# Patient Record
Sex: Female | Born: 1940 | Race: White | Hispanic: No
Health system: Midwestern US, Community
[De-identification: ages and names within clinical notes are randomized; demographics above are authoritative.]

## PROBLEM LIST (undated history)

## (undated) DIAGNOSIS — Z86010 Personal history of colonic polyps: Secondary | ICD-10-CM

## (undated) DIAGNOSIS — R0600 Dyspnea, unspecified: Secondary | ICD-10-CM

## (undated) DIAGNOSIS — Z95810 Presence of automatic (implantable) cardiac defibrillator: Secondary | ICD-10-CM

## (undated) DIAGNOSIS — I509 Heart failure, unspecified: Secondary | ICD-10-CM

## (undated) DIAGNOSIS — I119 Hypertensive heart disease without heart failure: Secondary | ICD-10-CM

## (undated) DIAGNOSIS — I2109 ST elevation (STEMI) myocardial infarction involving other coronary artery of anterior wall: Secondary | ICD-10-CM

## (undated) DIAGNOSIS — C3490 Malignant neoplasm of unspecified part of unspecified bronchus or lung: Secondary | ICD-10-CM

## (undated) DIAGNOSIS — E669 Obesity, unspecified: Secondary | ICD-10-CM

## (undated) DIAGNOSIS — F419 Anxiety disorder, unspecified: Secondary | ICD-10-CM

## (undated) DIAGNOSIS — E1140 Type 2 diabetes mellitus with diabetic neuropathy, unspecified: Secondary | ICD-10-CM

## (undated) DIAGNOSIS — I10 Essential (primary) hypertension: Secondary | ICD-10-CM

## (undated) DIAGNOSIS — E66812 Obesity, class 2: Secondary | ICD-10-CM

## (undated) HISTORY — PX: FRACTURE SURGERY: SHX138

## (undated) HISTORY — DX: Presence of automatic (implantable) cardiac defibrillator: Z95.810

## (undated) HISTORY — DX: Hypertensive heart disease without heart failure: I11.9

## (undated) HISTORY — PX: JOINT REPLACEMENT: SHX530

## (undated) HISTORY — DX: ST elevation (STEMI) myocardial infarction involving other coronary artery of anterior wall: I21.09

## (undated) HISTORY — PX: CARPAL TUNNEL RELEASE: SHX101

## (undated) HISTORY — DX: Obesity, class 2: E66.812

## (undated) HISTORY — DX: Obesity, unspecified: E66.9

## (undated) HISTORY — DX: Type 2 diabetes mellitus with diabetic neuropathy, unspecified: E11.40

## (undated) HISTORY — DX: Malignant neoplasm of unspecified part of unspecified bronchus or lung: C34.90

## (undated) HISTORY — PX: EYE SURGERY: SHX253

## (undated) HISTORY — PX: OTHER SURGICAL HISTORY: SHX169

---

## 2015-03-16 ENCOUNTER — Inpatient Hospital Stay: Payer: MEDICARE

## 2015-03-16 LAB — GLUCOSE, POC: Glucose, bedside: 133 MG/DL — ABNORMAL HIGH (ref 70–110)

## 2015-03-16 MED ORDER — LACTATED RINGERS IV
INTRAVENOUS | Status: DC
Start: 2015-03-16 — End: 2015-03-16
  Administered 2015-03-16: 15:00:00 via INTRAVENOUS

## 2015-03-16 MED ORDER — PROPOFOL 10 MG/ML IV EMUL
10 mg/mL | INTRAVENOUS | Status: AC
Start: 2015-03-16 — End: ?

## 2015-03-16 MED ORDER — LIDOCAINE (PF) 20 MG/ML (2 %) IJ SOLN
20 mg/mL (2 %) | INTRAMUSCULAR | Status: AC
Start: 2015-03-16 — End: ?

## 2015-03-16 MED ORDER — LIDOCAINE (PF) 20 MG/ML (2 %) IJ SOLN
20 mg/mL (2 %) | INTRAMUSCULAR | Status: DC | PRN
Start: 2015-03-16 — End: 2015-03-16
  Administered 2015-03-16: 16:00:00 via INTRAVENOUS

## 2015-03-16 MED ORDER — PROPOFOL 10 MG/ML IV EMUL
10 mg/mL | INTRAVENOUS | Status: DC | PRN
Start: 2015-03-16 — End: 2015-03-16
  Administered 2015-03-16 (×3): via INTRAVENOUS

## 2015-03-16 MED FILL — PROPOFOL 10 MG/ML IV EMUL: 10 mg/mL | INTRAVENOUS | Qty: 40

## 2015-03-16 MED FILL — LIDOCAINE (PF) 20 MG/ML (2 %) IJ SOLN: 20 mg/mL (2 %) | INTRAMUSCULAR | Qty: 5

## 2015-03-16 MED FILL — LACTATED RINGERS IV: INTRAVENOUS | Qty: 1000

## 2015-03-16 MED FILL — DIPRIVAN 10 MG/ML INTRAVENOUS EMULSION: 10 mg/mL | INTRAVENOUS | Qty: 200

## 2015-03-16 MED FILL — LIDOCAINE (PF) 20 MG/ML (2 %) IJ SOLN: 20 mg/mL (2 %) | INTRAMUSCULAR | Qty: 100

## 2015-03-16 NOTE — Anesthesia Post-Procedure Evaluation (Signed)
Post-Anesthesia Evaluation and Assessment    Patient: Anna MalletCatherine Castiglia MRN: [id number]  SSN: [id number]    Date of Birth: 12/09/1940  Age: 74 y.o.  Sex: female       Cardiovascular Function/Vital Signs  Visit Vitals   ??? BP 118/76   ??? Pulse 71   ??? Temp 36.7 ??C (98 ??F)   ??? Resp 15   ??? Ht 5' (1.524 m)   ??? Wt 79.4 kg (175 lb)   ??? SpO2 97%   ??? Breastfeeding No   ??? BMI 34.18 kg/m2       Patient is status post general, total IV anesthesia anesthesia for Procedure(s):  COLONOSCOPY with polypectomies.    Nausea/Vomiting: None    Postoperative hydration reviewed and adequate.    Pain:  Pain Scale 1: Numeric (0 - 10) (03/16/15 1201)  Pain Intensity 1: 0 (03/16/15 1201)   Managed    Neurological Status:       At baseline    Mental Status and Level of Consciousness: Arousable    Pulmonary Status:   O2 Device: Room air (03/16/15 1201)   Adequate oxygenation and airway patent    Complications related to anesthesia: None    Post-anesthesia assessment completed. No concerns    Signed By: Melina SchoolsElizabeth Shenna Brissette, MD     March 16, 2015

## 2015-03-16 NOTE — H&P (Signed)
[facility] H and P      Subjective:     Anna Pacheco is a 74 y.o. presenting for a colonoscopy         Past Medical History   Diagnosis Date   ??? CAD (coronary artery disease)    ??? Cancer [facility](HCC)      Thyroid cancer   ??? Diabetes (HCC)    ??? Heart failure (HCC) 2008     Silent MI and one stent   ??? Hypertension    ??? Psychiatric disorder      Aniexty   ??? Thyroid disease       Past Surgical History   Procedure Laterality Date   ??? Hx thyroidectomy     ??? Pr cardiac surg procedure unlist       Cardiac stent x1   ??? Hx gyn       D and C   ??? Hx pacemaker  2008     Pacemaker/Defibrilator    ??? Hx orthopaedic Left 2010     Total Knee Replacement   ??? Hx other surgical Bilateral      Laser surg for narrow angle glaucoma     History reviewed. No pertinent family history.   Social History   Substance Use Topics   ??? Smoking status: Not on file   ??? Smokeless tobacco: Not on file   ??? Alcohol use Not on file       Current Facility-Administered Medications   Medication Dose Route Frequency Provider Last Rate Last Dose   ??? lactated ringers infusion  75 mL/hr IntraVENous CONTINUOUS Tonette Ledereravid J. Massie Cogliano, MD 75 mL/hr at 03/16/15 1020 75 mL/hr at 03/16/15 1020      Current Discharge Medication List      CONTINUE these medications which have NOT CHANGED    Details   COQ10, LIPOSOMAL UBIQUINOL, PO Take 1 Tab by mouth daily.      SITagliptin (JANUVIA) 100 mg tablet Take 100 mg by mouth daily.      carvedilol (COREG) 6.25 mg tablet Take 6.25 mg by mouth two (2) times daily (with meals).      glimepiride (AMARYL) 1 mg tablet Take 1 mg by mouth Daily (before breakfast). Patient only took a 1/2 this am      metFORMIN (GLUCOPHAGE) 500 mg tablet Take 500 mg by mouth daily (with breakfast).      furosemide (LASIX) 40 mg tablet Take 40 mg by mouth daily.      lisinopril (PRINIVIL, ZESTRIL) 10 mg tablet Take 10 mg by mouth two (2) times a day.      atorvastatin (LIPITOR) 40 mg tablet Take 40 mg by mouth daily.       fenofibric acid (TRILIPIX) 135 mg capsule Take 135 mg by mouth daily.      levothyroxine (SYNTHROID) 112 mcg tablet Take 112 mcg by mouth Daily (before breakfast).      clonazePAM (KLONOPIN) 1 mg tablet Take 1 mg by mouth as needed.      aspirin delayed-release 81 mg tablet Take 81 mg by mouth daily.             Allergies   Allergen Reactions   ??? Latex Hives   ??? Nickel Swelling   ??? Other Plant, Animal, Environmental Hives     Patient gets arash to p[lastic          Objective:     Visit Vitals   ??? BP 137/51   ??? Pulse 72   ???  Temp 98.3 ??F (36.8 ??C)   ??? Resp 16   ??? Ht 5' (1.524 m)   ??? Wt 79.4 kg (175 lb)   ??? SpO2 96%   ??? Breastfeeding No   ??? BMI 34.18 kg/m2     Physical Exam:    Gen: Awake, NAD, alert and oriented x 3  HEENT:  no pharyngeal lesions, no scleral icterus  Neck: supple  Lungs: good bilateral BS, no respiratory distress  Cards: RRR s1s2, no murmurs, rubs or gallops  Abdomen: Positive bowel sounds, soft, NT, ND, no rebound or guarding, no HSM  Extremities: no clubbing, cyanosis or edema  Neuro: no asterixis, moves all extremities  Psych: full affect      Data Review:   Recent Results (from the past 24 hour(s))   GLUCOSE, POC    Collection Time: 03/16/15  9:50 AM   Result Value Ref Range    Glucose, bedside 133 (H) 70 - 110 MG/DL       No results found.    Assessment:   Hx of polyps, screen      Plan:   Colonoscopy  For the above procedure.  Risks reviewed in detail.  Risks including but not limited to bleeding, perforation, need for emergent surgery, drug reaction, infection, or missed lesions were all reviewed and informed consent obtained. All questions answered.    Signed By: Tonette Lederer, MD     March 16, 2015

## 2015-03-16 NOTE — Op Note (Signed)
[facility]  COLONOSCOPY    Name:  Anna Pacheco, Anna Pacheco  MR#:  [id number]  DOB:  07/29/1940  Account #:  [id number]  Date of Adm:  03/16/2015      DATE OF PROCEDURE: 03/16/2015    ENDOSCOPIST: Ishana Blades, MD.    PREOPERATIVE DIAGNOSIS: Screening and history of polyps.    POSTOPERATIVE DIAGNOSES  1. Diverticulosis.  2. Colonic polyps.    PROCEDURE PERFORMED: Colonoscopy with ileoscopy, biopsy.    ANESTHESIA: MAC.    ANESTHESIOLOGIST: Elizabeth Kao, MD    DESCRIPTION OF PROCEDURE: Findings: Digital rectal examination  performed, no masses. Terminal ileum and cecum normal. Ascending colon  had a small polyp removed with biopsy forceps. There was  diverticulosis in the ascending, transverse, descending and sigmoid,  it was dense in the descending and sigmoid. In the rectum, there was  also a small polyp that was removed with biopsy forceps, about 3 mm.  Retroflexion, no additional lesions seen. Tolerated well.    PLAN: Biopsy will be checked as outpatient. To be maintained on  fiber.        Sherolyn Trettin MD   :[id number]  D:  03/16/2015   11:37  T:  03/16/2015   18:38  Job #:  [id number]

## 2015-03-16 NOTE — Procedures (Signed)
Colonoscopy Operative Report    Anna Pacheco  [id number]  05/16/1940      Procedure Type:   Colonoscopy    Indications:  Hx of polyps and screen     Operator:  Gevorg Brum J. Faatima Tench, MD      Referring Provider: Andrew D. Hirsch, DO      Sedation:  MAC anesthesia      Procedure Details:  After informed consent was obtained with all risks and benefits of procedure explained and preoperative exam completed, the patient was taken to the endoscopy suite and placed in the left lateral decubitus position.  Upon sedation as per above, a digital rectal exam was performed.  The Olympus videocolonoscope  was inserted in the rectum and carefully advanced to the terminal ileum.  The cecum was identified by the ileocecal valve and appendiceal orifice.  The quality of preparation was excellent.  The colonoscope was slowly withdrawn with careful evaluation between folds. Retroflexion in the rectum was completed .     Findings:   Rectum: 3mm polyp bx  Sigmoid: moderate diverticulosis;  Descending Colon: moderate diverticulosis;  Transverse Colon: mild diverticulosis;  Ascending Colon: mild diverticulosis;3mm polyp bx  Cecum: normal  Terminal Ileum: normal      Specimen Removed:  polyps    Complications: None.     EBL:  None.    Impression:    Polyps and diverticulosis    Recommendations: --Await pathology., -High fiber diet.      Signed By: Karmah Potocki J. Chealsey Miyamoto, MD     03/16/2015  11:40 AM

## 2015-03-16 NOTE — Anesthesia Pre-Procedure Evaluation (Addendum)
Anesthetic History   No history of anesthetic complications            Review of Systems / Medical History  Patient summary reviewed, nursing notes reviewed and pertinent labs reviewed    Pulmonary  Within defined limits                 Neuro/Psych         Psychiatric history     Cardiovascular    Hypertension          Pacemaker, CAD and cardiac stents    Exercise tolerance: >4 METS     GI/Hepatic/Renal  Within defined limits              Endo/Other    Diabetes  Hypothyroidism  Cancer     Other Findings            Physical Exam    Airway  Mallampati: II  TM Distance: 4 - 6 cm  Neck ROM: normal range of motion   Mouth opening: Normal     Cardiovascular  Regular rate and rhythm,  S1 and S2 normal,  no murmur, click, rub, or gallop  Rhythm: regular  Rate: normal         Dental    Dentition: Full upper dentures     Pulmonary  Breath sounds clear to auscultation               Abdominal  GI exam deferred       Other Findings            Anesthetic Plan    ASA: 3  Anesthesia type: general and total IV anesthesia          Induction: Intravenous  Anesthetic plan and risks discussed with: Patient

## 2015-03-16 NOTE — Procedures (Signed)
Colonoscopy Operative Report    Anna MalletCatherine Pacheco  [id number]  09/06/1940      Procedure Type:   Colonoscopy    Indications:  Hx of polyps and screen     Operator:  Tonette Ledereravid J. Cayman Kielbasa, MD      Referring Provider: Arlington CalixAndrew D. Phoebe PerchHirsch, DO      Sedation:  MAC anesthesia      Procedure Details:  After informed consent was obtained with all risks and benefits of procedure explained and preoperative exam completed, the patient was taken to the endoscopy suite and placed in the left lateral decubitus position.  Upon sedation as per above, a digital rectal exam was performed.  The Olympus videocolonoscope  was inserted in the rectum and carefully advanced to the terminal ileum.  The cecum was identified by the ileocecal valve and appendiceal orifice.  The quality of preparation was excellent.  The colonoscope was slowly withdrawn with careful evaluation between folds. Retroflexion in the rectum was completed .     Findings:   Rectum: 3mm polyp bx  Sigmoid: moderate diverticulosis;  Descending Colon: moderate diverticulosis;  Transverse Colon: mild diverticulosis;  Ascending Colon: mild diverticulosis;563mm polyp bx  Cecum: normal  Terminal Ileum: normal      Specimen Removed:  polyps    Complications: None.     EBL:  None.    Impression:    Polyps and diverticulosis    Recommendations: --Await pathology., -High fiber diet.      Signed By: Tonette Ledereravid J. Theona Muhs, MD     03/16/2015  11:40 AM

## 2015-03-16 NOTE — Op Note (Signed)
[facility]    Name:  Anna MalletCOYLE, Carlia  MR#:  [id number]  DOB:  1941-04-04  Account #:  [id number]  Date of Adm:  03/16/2015      DATE OF PROCEDURE: 03/16/2015    ENDOSCOPIST: Doreen Beamavid Zelena Bushong, MD.    PREOPERATIVE DIAGNOSIS: Screening and history of polyps.    POSTOPERATIVE DIAGNOSES  1. Diverticulosis.  2. Colonic polyps.    PROCEDURE PERFORMED: Colonoscopy with ileoscopy, biopsy.    ANESTHESIA: MAC.    ANESTHESIOLOGIST: Melina SchoolsElizabeth Kao, MD    DESCRIPTION OF PROCEDURE: Findings: Digital rectal examination  performed, no masses. Terminal ileum and cecum normal. Ascending colon  had a small polyp removed with biopsy forceps. There was  diverticulosis in the ascending, transverse, descending and sigmoid,  it was dense in the descending and sigmoid. In the rectum, there was  also a small polyp that was removed with biopsy forceps, about 3 mm.  Retroflexion, no additional lesions seen. Tolerated well.    PLAN: Biopsy will be checked as outpatient. To be maintained on  fiber.        Rennis HardingELLIS, Maryfrances Portugal MD   :[id number]  D:  03/16/2015   11:37  T:  03/16/2015   18:38  Job #:  [id number]

## 2016-08-07 NOTE — Telephone Encounter (Signed)
Patient is a new patient will be seen 08/13/16 anxious about recent reports. Would like a call back.

## 2016-08-13 ENCOUNTER — Encounter: Attending: Surgery | Primary: Family Medicine

## 2017-05-03 ENCOUNTER — Ambulatory Visit (INDEPENDENT_AMBULATORY_CARE_PROVIDER_SITE_OTHER): Payer: Medicare Other | Admitting: Physician Assistant

## 2017-05-03 ENCOUNTER — Encounter: Payer: Self-pay | Admitting: Physician Assistant

## 2017-05-03 VITALS — BP 144/76 | HR 72 | Temp 98.8°F | Resp 16 | Ht 59.5 in | Wt 176.6 lb

## 2017-05-03 DIAGNOSIS — Z9889 Other specified postprocedural states: Secondary | ICD-10-CM | POA: Diagnosis not present

## 2017-05-03 DIAGNOSIS — H269 Unspecified cataract: Secondary | ICD-10-CM | POA: Diagnosis not present

## 2017-05-03 DIAGNOSIS — Z96659 Presence of unspecified artificial knee joint: Secondary | ICD-10-CM

## 2017-05-03 DIAGNOSIS — I509 Heart failure, unspecified: Secondary | ICD-10-CM

## 2017-05-03 DIAGNOSIS — E1140 Type 2 diabetes mellitus with diabetic neuropathy, unspecified: Secondary | ICD-10-CM | POA: Insufficient documentation

## 2017-05-03 DIAGNOSIS — I252 Old myocardial infarction: Secondary | ICD-10-CM | POA: Insufficient documentation

## 2017-05-03 DIAGNOSIS — F419 Anxiety disorder, unspecified: Secondary | ICD-10-CM | POA: Diagnosis not present

## 2017-05-03 DIAGNOSIS — E890 Postprocedural hypothyroidism: Secondary | ICD-10-CM | POA: Insufficient documentation

## 2017-05-03 DIAGNOSIS — M1990 Unspecified osteoarthritis, unspecified site: Secondary | ICD-10-CM | POA: Insufficient documentation

## 2017-05-03 DIAGNOSIS — I10 Essential (primary) hypertension: Secondary | ICD-10-CM | POA: Diagnosis not present

## 2017-05-03 DIAGNOSIS — J309 Allergic rhinitis, unspecified: Secondary | ICD-10-CM | POA: Insufficient documentation

## 2017-05-03 DIAGNOSIS — Z7689 Persons encountering health services in other specified circumstances: Secondary | ICD-10-CM

## 2017-05-03 DIAGNOSIS — E119 Type 2 diabetes mellitus without complications: Secondary | ICD-10-CM

## 2017-05-03 DIAGNOSIS — Z9009 Acquired absence of other part of head and neck: Secondary | ICD-10-CM | POA: Insufficient documentation

## 2017-05-03 MED ORDER — METOPROLOL TARTRATE 25 MG PO TABS: 25.0000 mg | ORAL_TABLET | Freq: Two times a day (BID) | ORAL | 3 refills | Status: DC

## 2017-05-03 NOTE — Progress Notes (Signed)
Patient: Megan Shelton Female    DOB: 1940-12-02   77 y.o.   MRN: [id number] Visit Date: 05/03/2017  Today's Provider: Mar Daring, PA-C   Chief Complaint  Patient presents with  . New Patient (Initial Visit)   Subjective:    HPI Patient comes in today to establish with the practice. She reports that just moved from [place] and has been in the area for a few weeks, moved 04/15/17. She is moving here with her son. He is still in [place] at this time. He is retiring in March and will be moving with his wife down here. She feels well today with no other complaints.   She does have a h/o of silent MI. In 2008 she developed severe SOB and was found to be in heart failure. During the echocardiogram they found an area of her heart that was ischemic. They did place a cardiac stent as well. Following surgery she went into ventricular fibrillation. She underwent surgery day after having stent placed to have a defibrillator placed. She reports only having been shocked by it once in the 11 years she had it, and it was by accident. Company called and told her she did not need to go to hospital, felt it was because she got her heart rate up exercising at that time. She did have the defibrillator changed last year. She is currently taking Carvedilol 6.25mg  BID, lisinopril 10mg  and atorvastatin 40mg . She does have furosemide 20mg  on hand that she will take prn but takes so infrequently she has not needed in a while. Most recent labs were done on 03/05/17 and are all normal. These will be scanned in chart.   She also has small cataracts bilaterally that are not yet bad enough for repair. She is going to establish with an ophthalmologist.  She is also diabetic. She is well controlled with most recent A1c of 6.9 from 03/05/17. She is controlled with diet, exercise, metformin xr 500mg  daily, glimepiride 1mg , and Januvia 100mg .   She has had a thyroidectomy. She is stable on Levothyroxine 123mcg. TSH on  03/05/17 was 0.624.     Allergies  Allergen Reactions  . Latex   . Nickel      Current Outpatient Medications:  .  aspirin 81 MG tablet, Take 81 mg by mouth daily., Disp: , Rfl:  .  atorvastatin (LIPITOR) 40 MG tablet, Take 40 mg by mouth daily., Disp: , Rfl:  .  carvedilol (COREG) 6.25 MG tablet, Take 6.25 mg by mouth 2 (two) times daily with a meal., Disp: , Rfl:  .  clonazePAM (KLONOPIN) 1 MG tablet, Take 1 mg by mouth 3 (three) times daily as needed for anxiety., Disp: , Rfl:  .  Coenzyme Q10 (COQ10 PO), Take 1 capsule by mouth daily., Disp: , Rfl:  .  glimepiride (AMARYL) 1 MG tablet, Take 1 mg by mouth 2 (two) times daily., Disp: , Rfl:  .  levothyroxine (SYNTHROID, LEVOTHROID) 112 MCG tablet, Take 112 mcg by mouth daily before breakfast., Disp: , Rfl:  .  lisinopril (PRINIVIL,ZESTRIL) 10 MG tablet, Take 10 mg by mouth daily., Disp: , Rfl:  .  metFORMIN (GLUCOPHAGE-XR) 500 MG 24 hr tablet, Take 500 mg by mouth. Take 1 tablet by mouth in the mornings, and 2 tablets by mouth in the evenings., Disp: , Rfl:  .  sitaGLIPtin (JANUVIA) 100 MG tablet, Take 100 mg by mouth daily., Disp: , Rfl:  .  vitamin B-12 (CYANOCOBALAMIN) 100  MCG tablet, Take 100 mcg by mouth daily., Disp: , Rfl:   Review of Systems  Constitutional: Negative.   HENT: Negative.   Eyes: Negative.   Respiratory: Negative.   Cardiovascular: Negative.   Gastrointestinal: Negative.   Endocrine: Negative.   Genitourinary: Negative.   Musculoskeletal: Positive for arthralgias and back pain.  Skin: Negative.   Allergic/Immunologic: Negative.   Neurological: Negative.   Hematological: Negative.   Psychiatric/Behavioral: Negative.     Social History   Tobacco Use  . Smoking status: Former Research scientist (life sciences)  . Smokeless tobacco: Never Used  Substance Use Topics  . Alcohol use: No    Frequency: Never   Objective:   BP (!) 144/76 (BP Location: Left Arm, Patient Position: Sitting, Cuff Size: Normal)   Pulse 72   Temp 98.8  F (37.1 C)   Resp 16   Ht 4' 11.5" (1.511 m)   Wt 176 lb 9.6 oz (80.1 kg)   SpO2 96%   BMI 35.07 kg/m  Vitals:   05/03/17 1447  BP: (!) 144/76  Pulse: 72  Resp: 16  Temp: 98.8 F (37.1 C)  SpO2: 96%  Weight: 176 lb 9.6 oz (80.1 kg)  Height: 4' 11.5" (1.511 m)     Physical Exam  Constitutional: She appears well-developed and well-nourished. No distress.  Neck: Normal range of motion. Neck supple.  Cardiovascular: Normal rate, regular rhythm and normal heart sounds. Exam reveals no gallop and no friction rub.  No murmur heard. Pulmonary/Chest: Effort normal and breath sounds normal. No respiratory distress. She has no wheezes. She has no rales.  Musculoskeletal: She exhibits no edema.  Skin: She is not diaphoretic.  Vitals reviewed.      Assessment & Plan:     1. Establishing care with new doctor, encounter for Establishing care from [place]. Medical records requested. Labs reviewed from 03/05/17. I will see her back in 6 months for chronic issues and recheck labs.  2. Allergic rhinitis, unspecified seasonality, unspecified trigger Stable.   3. Anxiety Stable on clonazepam 1mg  TID prn. She will call when refills are needed. She will fill this locally.   4. Arthritis Stable. Has had knee replacements.   5. Cataract, unspecified cataract type, unspecified laterality Going to establish with an ophthalmologist.   6. Congestive heart failure, unspecified HF chronicity, unspecified heart failure type ([place]) Wants to change carvedilol as it looks like her lisinopril and she does not want to mix the two medications up. Will change to metoprolol as below. Will recheck BP and labs when she returns in May.  - metoprolol tartrate (LOPRESSOR) 25 MG tablet; Take 1 tablet (25 mg total) by mouth 2 (two) times daily.  Dispense: 180 tablet; Refill: 3  7. Diabetes mellitus without complication (HCC) Stable. Continue current medical treatment plan.  8. Essential hypertension See above  medical treatment plan for #6. - metoprolol tartrate (LOPRESSOR) 25 MG tablet; Take 1 tablet (25 mg total) by mouth 2 (two) times daily.  Dispense: 180 tablet; Refill: 3  9. History of heart attack Stable with defibrillator in place. Is going to establish with cardiology.   10. H/O thyroidectomy Stable.   11. History of total knee replacement, unspecified laterality Stable.        Mar Daring, PA-C  [facility]

## 2017-05-03 NOTE — Patient Instructions (Signed)
Metoprolol tablets What is this medicine? METOPROLOL (me TOE proe lole) is a beta-blocker. Beta-blockers reduce the workload on the heart and help it to beat more regularly. This medicine is used to treat high blood pressure and to prevent chest pain. It is also used to after a heart attack and to prevent an additional heart attack from occurring. This medicine may be used for other purposes; ask your health care provider or pharmacist if you have questions. COMMON BRAND NAME(S): Lopressor What should I tell my health care provider before I take this medicine? They need to know if you have any of these conditions: -diabetes -heart or vessel disease like slow heart rate, worsening heart failure, heart block, sick sinus syndrome or Raynaud's disease -kidney disease -liver disease -lung or breathing disease, like asthma or emphysema -pheochromocytoma -thyroid disease -an unusual or allergic reaction to metoprolol, other beta-blockers, medicines, foods, dyes, or preservatives -pregnant or trying to get pregnant -breast-feeding How should I use this medicine? Take this medicine by mouth with a drink of water. Follow the directions on the prescription label. Take this medicine immediately after meals. Take your doses at regular intervals. Do not take more medicine than directed. Do not stop taking this medicine suddenly. This could lead to serious heart-related effects. Talk to your pediatrician regarding the use of this medicine in children. Special care may be needed. Overdosage: If you think you have taken too much of this medicine contact a poison control center or emergency room at once. NOTE: This medicine is only for you. Do not share this medicine with others. What if I miss a dose? If you miss a dose, take it as soon as you can. If it is almost time for your next dose, take only that dose. Do not take double or extra doses. What may interact with this medicine? This medicine may interact  with the following medications: -certain medicines for blood pressure, heart disease, irregular heart beat -certain medicines for depression like monoamine oxidase (MAO) inhibitors, fluoxetine, or paroxetine -clonidine -dobutamine -epinephrine -isoproterenol -reserpine This list may not describe all possible interactions. Give your health care provider a list of all the medicines, herbs, non-prescription drugs, or dietary supplements you use. Also tell them if you smoke, drink alcohol, or use illegal drugs. Some items may interact with your medicine. What should I watch for while using this medicine? Visit your doctor or health care professional for regular check ups. Contact your doctor right away if your symptoms worsen. Check your blood pressure and pulse rate regularly. Ask your health care professional what your blood pressure and pulse rate should be, and when you should contact them. You may get drowsy or dizzy. Do not drive, use machinery, or do anything that needs mental alertness until you know how this medicine affects you. Do not sit or stand up quickly, especially if you are an older patient. This reduces the risk of dizzy or fainting spells. Contact your doctor if these symptoms continue. Alcohol may interfere with the effect of this medicine. Avoid alcoholic drinks. What side effects may I notice from receiving this medicine? Side effects that you should report to your doctor or health care professional as soon as possible: -allergic reactions like skin rash, itching or hives -cold or numb hands or feet -depression -difficulty breathing -faint -fever with sore throat -irregular heartbeat, chest pain -rapid weight gain -swollen legs or ankles Side effects that usually do not require medical attention (report to your doctor or health care professional  if they continue or are bothersome): -anxiety or nervousness -change in sex drive or performance -dry  skin -headache -nightmares or trouble sleeping -short term memory loss -stomach upset or diarrhea -unusually tired This list may not describe all possible side effects. Call your doctor for medical advice about side effects. You may report side effects to FDA at 1-800-FDA-1088. Where should I keep my medicine? Keep out of the reach of children. Store at room temperature between 15 and 30 degrees C (59 and 86 degrees F). Throw away any unused medicine after the expiration date. NOTE: This sheet is a summary. It may not cover all possible information. If you have questions about this medicine, talk to your doctor, pharmacist, or health care provider.  2018 Elsevier/Gold Standard (2012-11-28 14:40:36)

## 2017-05-16 ENCOUNTER — Other Ambulatory Visit: Payer: Self-pay | Admitting: Physician Assistant

## 2017-05-16 DIAGNOSIS — F411 Generalized anxiety disorder: Secondary | ICD-10-CM

## 2017-05-16 MED ORDER — CLONAZEPAM 1 MG PO TABS
1.0000 mg | ORAL_TABLET | Freq: Three times a day (TID) | ORAL | 5 refills | Status: DC | PRN
Start: 2017-05-16 — End: 2017-11-15

## 2017-05-16 NOTE — Progress Notes (Signed)
Please call in clonazepam 1mg  tab Take 1 tab PO TID prn anxiety #90 5 refills to [place] [phone]

## 2017-05-16 NOTE — Progress Notes (Signed)
Called into [facility]

## 2017-06-07 ENCOUNTER — Telehealth: Payer: Self-pay | Admitting: Physician Assistant

## 2017-06-07 NOTE — Telephone Encounter (Signed)
Pt brought [employer] Patient Assistance Program Enrollment form in for the provider to fill out. Pt states when finished please just mail form. Form placed in providers box. Thanks CC

## 2017-06-10 NOTE — Telephone Encounter (Signed)
Gwenette Greet do you have this form?

## 2017-06-11 NOTE — Telephone Encounter (Signed)
Form completed and given to [facility]

## 2017-06-11 NOTE — Telephone Encounter (Signed)
Placed up front to be mailed.

## 2017-06-14 ENCOUNTER — Telehealth: Payer: Self-pay

## 2017-06-14 NOTE — Telephone Encounter (Signed)
LVM for call back - calling to which ICD she has [employer]er) for industry set up on Tuesday.

## 2017-06-17 NOTE — Telephone Encounter (Signed)
Pt calling back to let us know she uses [employer]

## 2017-06-18 ENCOUNTER — Ambulatory Visit (INDEPENDENT_AMBULATORY_CARE_PROVIDER_SITE_OTHER): Payer: Medicare Other | Admitting: Internal Medicine

## 2017-06-18 ENCOUNTER — Encounter: Payer: Self-pay | Admitting: Internal Medicine

## 2017-06-18 VITALS — BP 136/66 | HR 61 | Ht 60.0 in | Wt 169.0 lb

## 2017-06-18 DIAGNOSIS — Z95810 Presence of automatic (implantable) cardiac defibrillator: Secondary | ICD-10-CM

## 2017-06-18 DIAGNOSIS — I255 Ischemic cardiomyopathy: Secondary | ICD-10-CM

## 2017-06-18 NOTE — Patient Instructions (Addendum)
Medication Instructions: - Your physician recommends that you continue on your current medications as directed. Please refer to the Current Medication list given to you today.  Labwork: - none ordered  Procedures/Testing: - none ordered  Follow-Up: - Remote monitoring is used to monitor your Pacemaker of ICD from home. This monitoring reduces the number of office visits required to check your device to one time per year. It allows [place] to keep an eye on the functioning of your device to ensure it is working properly. You are scheduled for a device check from home on 09/17/17. You may send your transmission at any time that day. If you have a wireless device, the transmission will be sent automatically. After your physician reviews your transmission, you will receive a postcard with your next transmission date.  - Your physician wants you to follow-up in: 6 months with Dr. Saunders Revel to establish (general cardiology). You will receive a reminder letter in the mail two months in advance. If you don't receive a letter, please call our office to schedule the follow-up appointment.  - Your physician wants you to follow-up in: 1 year with Dr. Caryl Comes (device follow up).  You will receive a reminder letter in the mail two months in advance. If you don't receive a letter, please call our office to schedule the follow-up appointment.   Any Additional Special Instructions Will Be Listed Below (If Applicable).     If you need a refill on your cardiac medications before your next appointment, please call your pharmacy.

## 2017-06-18 NOTE — Progress Notes (Signed)
ELECTROPHYSIOLOGY OFFICE NOTE  Patient ID: Megan Shelton, MRN: [id number], DOB/AGE: 1940-05-26 77 y.o. Admit date: (Not on file) Date of Consult: 06/18/2017  Primary Physician: Mar Daring, PA-C Primary Cardiologist: nEW     Megan Shelton is a 77 y.o. female who is being seen today for the evaluation of previously implanted ICD    HPI [name] is a 77 y.o. female  Recently moved from [place].  She has an ICD implanted originally 2008 with generator replacement 2016 (Medtronic).  She developed congestive heart failure 2008 in the setting of a out of hospital MI.  She underwent stenting of a high-grade circumflex lesion with a total of the LAD.  Something happened while in hospital at the [facility] that prompted them to implant an ICD.  It has never delivered therapy.  She has hypertension and diabetes.  She denies sleep apnea.  She has mild exercise intolerance; she denies chest pain peripheral edema orthopnea or nocturnal dyspnea.  She has had no palpitations or syncope.  She has stopped her aspirin.  DATE TEST EF   2008 Cath  20-25 %   9/17 Echo   35 %   7/18 Echo  55%       Surgical History:  Past Surgical History:  Procedure Laterality Date  . EYE SURGERY       Home Meds: Prior to Admission medications   Medication Sig Start Date End Date Taking? Authorizing Provider  aspirin 81 MG tablet Take 81 mg by mouth daily.   Yes [provider]  atorvastatin (LIPITOR) 40 MG tablet Take 40 mg by mouth daily.   Yes [provider]  clonazePAM (KLONOPIN) 1 MG tablet Take 1 tablet (1 mg total) by mouth 3 (three) times daily as needed for anxiety. 05/16/17  Yes Mar Daring, PA-C  Coenzyme Q10 (COQ10 PO) Take 1 capsule by mouth daily.   Yes [provider]  glimepiride (AMARYL) 1 MG tablet Take 1 mg by mouth 2 (two) times daily.   Yes [provider]  levothyroxine (SYNTHROID, LEVOTHROID) 112  MCG tablet Take 112 mcg by mouth daily before breakfast.   Yes [provider]  lisinopril (PRINIVIL,ZESTRIL) 10 MG tablet Take 10 mg by mouth daily.   Yes [provider]  metFORMIN (GLUCOPHAGE-XR) 500 MG 24 hr tablet Take 500 mg by mouth. Take 1 tablet by mouth in the mornings, and 2 tablets by mouth in the evenings.   Yes [provider]  metoprolol tartrate (LOPRESSOR) 25 MG tablet Take 1 tablet (25 mg total) by mouth 2 (two) times daily. 05/03/17  Yes Mar Daring, PA-C  sitaGLIPtin (JANUVIA) 100 MG tablet Take 100 mg by mouth daily.   Yes [provider]  vitamin B-12 (CYANOCOBALAMIN) 100 MCG tablet Take 100 mcg by mouth daily.   Yes [provider]    Allergies:  Allergies  Allergen Reactions  . Latex   . Nickel     Social History   Socioeconomic History  . Marital status: Widowed    Spouse name: Not on file  . Number of children: Not on file  . Years of education: Not on file  . Highest education level: Not on file  Social Needs  . Financial resource strain: Not on file  . Food insecurity - worry: Not on file  . Food insecurity - inability: Not on file  . Transportation needs - medical: Not on file  . Transportation needs -  non-medical: Not on file  Occupational History  . Not on file  Tobacco Use  . Smoking status: Former Research scientist (life sciences)  . Smokeless tobacco: Never Used  Substance and Sexual Activity  . Alcohol use: No    Frequency: Never  . Drug use: No  . Sexual activity: Not on file  Other Topics Concern  . Not on file  Social History Narrative  . Not on file     Family History  Problem Relation Age of Onset  . Rashes / Skin problems Mother   . Cancer Father 7       colon cancer  . Heart disease Brother        died of heart attack  . Heart disease Brother      ROS:  Please see the history of present illness.     All other systems reviewed and negative.    Physical Exam: Blood pressure 136/66, pulse 61,  height 5' (1.524 m), weight 169 lb (76.7 kg). General: Well developed, well nourished female in no acute distress. Head: Normocephalic, atraumatic, sclera non-icteric, no xanthomas, nares are without discharge. EENT: normal  Lymph Nodes:  none Neck: Negative for carotid bruits. JVD not elevated. Back:without scoliosis kyphosis Lungs: Clear bilaterally to auscultation without wheezes, rales, or rhonchi. Breathing is unlabored. Heart: RRR with S1 S2.  2/6 systolic murmur . No rubs, or gallops appreciated. Abdomen: Soft, non-tender, non-distended with normoactive bowel sounds. No hepatomegaly. No rebound/guarding. No obvious abdominal masses. Msk:  Strength and tone appear normal for age. Extremities: No clubbing or cyanosis. No edema.  Distal pedal pulses are 2+ and equal bilaterally. Skin: Warm and Dry Neuro: Alert and oriented X 3. CN III-XII intact Grossly normal sensory and motor function . Psych:  Responds to questions appropriately with a normal affect.      Labs: Cardiac Enzymes No results for input(s): CKTOTAL, CKMB, TROPONINI in the last 72 hours. CBC No results found for: WBC, HGB, HCT, MCV, PLT PROTIME: No results for input(s): LABPROT, INR in the last 72 hours. Chemistry No results for input(s): NA, K, CL, CO2, BUN, CREATININE, CALCIUM, PROT, BILITOT, ALKPHOS, ALT, AST, GLUCOSE in the last 168 hours.  Invalid input(s): LABALBU Lipids No results found for: CHOL, HDL, LDLCALC, TRIG BNP No results found for: PROBNP Thyroid Function Tests: No results for input(s): TSH, T4TOTAL, T3FREE, THYROIDAB in the last 72 hours.  Invalid input(s): FREET3 Miscellaneous No results found for: DDIMER  Radiology/Studies:  No results found.  EKG:SR 61 15/112/42 Septal  MI TWI  RSR'    Assessment and Plan: Ischemic cardiomyopathy with interval normalization of LV function  Implantable defibrillator-primary prevention (presumed)  Congestive heart failure-chronic-mixed class  II    Device function is normal.  No intercurrent symptoms of ischemia.  Discrepant echocardiograms EF 35 and 55 over the last year.  Suggested a repeat echo; the patient was not particularly interested.  As she is having no symptoms we will not push it.  In the context of an ejection fraction of 55%, metoprolol tartrate is reasonable; it had replaced carvedilol more appropriate for the depressed left ventricular function she is on statin therapy  She had on her own stopped her aspirin.  I have advised that she resume it.  Virl Axe

## 2017-08-22 ENCOUNTER — Other Ambulatory Visit: Payer: Self-pay

## 2017-08-22 DIAGNOSIS — E039 Hypothyroidism, unspecified: Secondary | ICD-10-CM

## 2017-08-22 DIAGNOSIS — I10 Essential (primary) hypertension: Secondary | ICD-10-CM

## 2017-08-22 DIAGNOSIS — E119 Type 2 diabetes mellitus without complications: Secondary | ICD-10-CM

## 2017-08-22 MED ORDER — GLIMEPIRIDE 1 MG PO TABS: 1.0000 mg | ORAL_TABLET | Freq: Two times a day (BID) | ORAL | 1 refills | Status: DC

## 2017-08-22 MED ORDER — LISINOPRIL 10 MG PO TABS: 10.0000 mg | ORAL_TABLET | Freq: Every day | ORAL | 1 refills | Status: DC

## 2017-08-22 MED ORDER — METFORMIN HCL ER 500 MG PO TB24: 500.0000 mg | ORAL_TABLET | Freq: Every day | ORAL | 1 refills | Status: DC

## 2017-08-22 MED ORDER — GLUCOSE BLOOD VI STRP: ORAL_STRIP | 12 refills | Status: DC

## 2017-08-22 MED ORDER — LANCETS THIN MISC: 1.0000 [IU] | Freq: Every day | 1 refills | Status: DC

## 2017-08-22 MED ORDER — LEVOTHYROXINE SODIUM 112 MCG PO TABS: 112.0000 ug | ORAL_TABLET | Freq: Every day | ORAL | 1 refills | Status: DC

## 2017-08-26 ENCOUNTER — Other Ambulatory Visit: Payer: Self-pay | Admitting: Physician Assistant

## 2017-08-26 DIAGNOSIS — E119 Type 2 diabetes mellitus without complications: Secondary | ICD-10-CM

## 2017-08-26 MED ORDER — METFORMIN HCL ER 500 MG PO TB24: ORAL_TABLET | ORAL | 1 refills | Status: DC

## 2017-08-26 NOTE — Progress Notes (Signed)
Metformin reordered

## 2017-08-28 ENCOUNTER — Other Ambulatory Visit: Payer: Self-pay | Admitting: Physician Assistant

## 2017-08-28 DIAGNOSIS — E119 Type 2 diabetes mellitus without complications: Secondary | ICD-10-CM

## 2017-08-28 MED ORDER — ONETOUCH VERIO IQ SYSTEM W/DEVICE KIT: PACK | 0 refills | Status: DC

## 2017-08-28 NOTE — Progress Notes (Signed)
[employer] request for new onetouch verio IQ kit received. This was sent in.

## 2017-09-17 ENCOUNTER — Telehealth: Payer: Self-pay

## 2017-09-17 ENCOUNTER — Ambulatory Visit (INDEPENDENT_AMBULATORY_CARE_PROVIDER_SITE_OTHER): Payer: Medicare Other | Admitting: *Deleted

## 2017-09-17 DIAGNOSIS — I255 Ischemic cardiomyopathy: Secondary | ICD-10-CM

## 2017-09-17 NOTE — Progress Notes (Signed)
Remote ICD transmission.   

## 2017-09-17 NOTE — Telephone Encounter (Signed)
LMOVM reminding pt to send remote transmission.   

## 2017-09-18 ENCOUNTER — Encounter: Payer: Self-pay | Admitting: Cardiology

## 2017-09-23 LAB — CUP PACEART REMOTE DEVICE CHECK
HIGH POWER IMPEDANCE MEASURED VALUE: 93 Ohm
Implantable Lead Implant Date: 20080121
Implantable Lead Serial Number: 107986
Lead Channel Impedance Value: 513 Ohm
Lead Channel Pacing Threshold Amplitude: 1.125 V
Lead Channel Sensing Intrinsic Amplitude: 22 mV
Lead Channel Sensing Intrinsic Amplitude: 22 mV
MDC IDC LEAD LOCATION: 753860
MDC IDC MSMT BATTERY REMAINING LONGEVITY: 110 mo
MDC IDC MSMT BATTERY VOLTAGE: 3.01 V
MDC IDC MSMT LEADCHNL RV IMPEDANCE VALUE: 513 Ohm
MDC IDC MSMT LEADCHNL RV PACING THRESHOLD PULSEWIDTH: 0.4 ms
MDC IDC PG IMPLANT DT: 20160415
MDC IDC SESS DTM: 20190611140552
MDC IDC SET LEADCHNL RV PACING AMPLITUDE: 2.25 V
MDC IDC SET LEADCHNL RV PACING PULSEWIDTH: 0.4 ms
MDC IDC SET LEADCHNL RV SENSING SENSITIVITY: 0.3 mV
MDC IDC STAT BRADY RV PERCENT PACED: 0.02 %

## 2017-10-10 ENCOUNTER — Other Ambulatory Visit: Payer: Self-pay | Admitting: Physician Assistant

## 2017-10-10 DIAGNOSIS — E119 Type 2 diabetes mellitus without complications: Secondary | ICD-10-CM

## 2017-11-15 ENCOUNTER — Other Ambulatory Visit: Payer: Self-pay | Admitting: Physician Assistant

## 2017-11-15 DIAGNOSIS — F411 Generalized anxiety disorder: Secondary | ICD-10-CM

## 2017-11-15 NOTE — Telephone Encounter (Signed)
Patient calling requesting refills on medication. Please advise. Thanks!

## 2017-11-19 ENCOUNTER — Ambulatory Visit: Payer: Medicare Other

## 2017-11-19 ENCOUNTER — Ambulatory Visit: Payer: Self-pay

## 2017-11-19 ENCOUNTER — Encounter: Payer: Self-pay | Admitting: Physician Assistant

## 2017-11-19 ENCOUNTER — Telehealth: Payer: Self-pay | Admitting: Physician Assistant

## 2017-11-19 ENCOUNTER — Encounter: Payer: Medicare Other | Admitting: Physician Assistant

## 2017-11-26 ENCOUNTER — Telehealth: Payer: Self-pay

## 2017-11-26 NOTE — Telephone Encounter (Signed)
Pt returned outreach call and scheduled her AWV for 12/12/17 @ 2 AM -MM

## 2017-11-26 NOTE — Telephone Encounter (Signed)
LMTCB and r/s previously cancelled AWV. Need apt prior to CPE on 12/19/17. -MM

## 2017-12-12 ENCOUNTER — Ambulatory Visit: Payer: Medicare Other

## 2017-12-13 ENCOUNTER — Ambulatory Visit (INDEPENDENT_AMBULATORY_CARE_PROVIDER_SITE_OTHER): Payer: Medicare Other

## 2017-12-13 ENCOUNTER — Ambulatory Visit: Payer: Medicare Other

## 2017-12-13 VITALS — BP 142/66 | HR 66 | Temp 97.8°F | Ht 60.0 in | Wt 156.4 lb

## 2017-12-13 DIAGNOSIS — Z0000 Encounter for general adult medical examination without abnormal findings: Secondary | ICD-10-CM

## 2017-12-13 DIAGNOSIS — Z23 Encounter for immunization: Secondary | ICD-10-CM

## 2017-12-13 NOTE — Progress Notes (Signed)
Subjective:   Megan Shelton is a 77 y.o. female who presents for an Initial Medicare Annual Wellness Visit.  Review of Systems    N/A    Cardiac Risk Factors include: advanced age (>37mn, >>66women);diabetes mellitus;hypertension;obesity (BMI >30kg/m2)     Objective:    Today's Vitals   12/13/17 1311  BP: (!) 142/66  Pulse: 66  Temp: 97.8 F (36.6 C)  TempSrc: Oral  Weight: 156 lb 6.4 oz (70.9 kg)  Height: 5' (1.524 m)  PainSc: 0-No pain   Body mass index is 30.54 kg/m.  Advanced Directives 12/13/2017 12/13/2017  Does Patient Have a Medical Advance Directive? No Yes  Type of Advance Directive - [place] Does patient want to make changes to medical advance directive? Yes (MAU/Ambulatory/Procedural Areas - Information given) -  Copy of HT[place]onin Chart? - No - copy requested    Current Medications (verified) Outpatient Encounter Medications as of 12/13/2017  Medication Sig  . Blood Glucose Monitoring Suppl (ONETOUCH VERIO IQ SYSTEM) w/Device KIT To check blood sugar once daily.  .Marland Kitchenaspirin 81 MG tablet Take 81 mg by mouth daily.  .Marland Kitchenatorvastatin (LIPITOR) 40 MG tablet Take 40 mg by mouth daily.  . clonazePAM (KLONOPIN) 1 MG tablet TAKE 1 TABLET BY MOUTH 3 TIMES A DAY AS NEEDED FOR ANXIETY  . Coenzyme Q10 (COQ10 PO) Take 1 capsule by mouth daily.  .Marland Kitchenglimepiride (AMARYL) 1 MG tablet Take 1 tablet (1 mg total) by mouth 2 (two) times daily.  .Marland Kitchenglucose blood test strip To check BS once daily  . levothyroxine (SYNTHROID, LEVOTHROID) 112 MCG tablet Take 1 tablet (112 mcg total) by mouth daily before breakfast.  . lisinopril (PRINIVIL,ZESTRIL) 10 MG tablet Take 1 tablet (10 mg total) by mouth daily.  . metFORMIN (GLUCOPHAGE-XR) 500 MG 24 hr tablet Take 1 tablet by mouth in the mornings, and 2 tablets by mouth in the evenings.  . metoprolol tartrate (LOPRESSOR) 25 MG tablet Take 1 tablet (25 mg total) by mouth 2 (two) times daily.  .Glory RosebushDELICA LANCETS 309TMISC CHECK BLOOD SUGAR ONCE  DAILY  . sitaGLIPtin (JANUVIA) 100 MG tablet Take 100 mg by mouth daily.  . vitamin B-12 (CYANOCOBALAMIN) 100 MCG tablet Take 100 mcg by mouth daily.   No facility-administered encounter medications on file as of 12/13/2017.     Allergies (verified) Latex and Nickel   History: Past Medical History:  Diagnosis Date  . Diabetes mellitus with neuropathy ([place]   . Hypertensive heart disease   . Implantable cardioverter-defibrillator (ICD)-Medtronic   . Myocardial infarction, anterior wall ([place]   . Obesity, Class II, BMI 35-39.9    Past Surgical History:  Procedure Laterality Date  . EYE SURGERY     Family History  Problem Relation Age of Onset  . Rashes / Skin problems Mother   . Cancer Father 474      colon cancer  . Heart disease Brother        died of heart attack  . Heart disease Brother    Social History   Socioeconomic History  . Marital status: Widowed    Spouse name: Not on file  . Number of children: 3  . Years of education: Not on file  . Highest education level: High school graduate  Occupational History  . Occupation: retired  SScientific laboratory technician . Financial resource strain: Not hard at all  . Food insecurity:    Worry: Never  true    Inability: Never true  . Transportation needs:    Medical: No    Non-medical: No  Tobacco Use  . Smoking status: Former Smoker    Types: Cigarettes  . Smokeless tobacco: Never Used  . Tobacco comment: quit about 18 years ago; as of 2019  Substance and Sexual Activity  . Alcohol use: No    Frequency: Never  . Drug use: No  . Sexual activity: Not on file  Lifestyle  . Physical activity:    Days per week: Not on file    Minutes per session: Not on file  . Stress: Rather much  Relationships  . Social connections:    Talks on phone: Not on file    Gets together: Not on file    Attends religious service: Not on file    Active member of club or organization: Not on  file    Attends meetings of clubs or organizations: Not on file    Relationship status: Not on file  Other Topics Concern  . Not on file  Social History Narrative  . Not on file    Tobacco Counseling Counseling given: Not Answered Comment: quit about 18 years ago; as of 2019   Clinical Intake:  Pre-visit preparation completed: Yes  Pain : No/denies pain Pain Score: 0-No pain     Nutritional Status: BMI > 30  Obese Nutritional Risks: None Diabetes: Yes(type 2) CBG done?: No Did pt. bring in CBG monitor from home?: No  How often do you need to have someone help you when you read instructions, pamphlets, or other written materials from your doctor or pharmacy?: 1 - Never  Interpreter Needed?: No  Information entered by :: [facility], LPN   Activities of Daily Living In your present state of health, do you have any difficulty performing the following activities: 12/13/2017 05/03/2017  Hearing? N N  Vision? N Y  Difficulty concentrating or making decisions? N N  Walking or climbing stairs? N N  Dressing or bathing? N N  Doing errands, shopping? N N  Preparing Food and eating ? N -  Using the Toilet? N -  In the past six months, have you accidently leaked urine? N -  Do you have problems with loss of bowel control? N -  Managing your Medications? N -  Managing your Finances? N -  Housekeeping or managing your Housekeeping? N -  Some recent data might be hidden     Immunizations and Health Maintenance Immunization History  Administered Date(s) Administered  . Influenza, High Dose Seasonal PF 12/13/2017   Health Maintenance Due  Topic Date Due  . HEMOGLOBIN A1C  08/09/1940  . FOOT EXAM  12/11/1951  . OPHTHALMOLOGY EXAM  12/11/1951  . TETANUS/TDAP  12/10/1960  . DEXA SCAN  12/11/2006  . PNA vac Low Risk Adult (1 of 2 - PCV13) 12/11/2006    Patient Care Team: Mar Daring, PA-C as PCP - General (Family Medicine) Deboraha Sprang, MD as Consulting  Physician (Cardiology)  Indicate any recent Medical Services you may have received from other than [facility] providers in the past year (date may be approximate).     Assessment:   This is a routine wellness examination for Sandy.  Hearing/Vision screen No exam data present  Dietary issues and exercise activities discussed: Current Exercise Habits: Structured exercise class, Type of exercise: Other - see comments(water aerobics), Time (Minutes): 60, Frequency (Times/Week): 3, Weekly Exercise (Minutes/Week): 180, Intensity: Mild, Exercise limited by: None  identified  Goals    . DIET - INCREASE WATER INTAKE     Recommend increasing water intake to 4-6 glasses a day.      Depression Screen PHQ 2/9 Scores 12/13/2017 05/03/2017  PHQ - 2 Score 0 0  PHQ- 9 Score - 6    Fall Risk Fall Risk  12/13/2017 05/03/2017  Falls in the past year? No No    Is the patient's home free of loose throw rugs in walkways, pet beds, electrical cords, etc?   yes      Grab bars in the bathroom? no      Handrails on the stairs?   no      Adequate lighting?   yes  Timed Get Up and Go Performed N/A  Cognitive Function:     6CIT Screen 12/13/2017  What Year? 0 points  What month? 0 points  What time? 0 points  Count back from 20 0 points  Months in reverse 0 points  Repeat phrase 2 points  Total Score 2    Screening Tests Health Maintenance  Topic Date Due  . HEMOGLOBIN A1C  1940/08/25  . FOOT EXAM  12/11/1951  . OPHTHALMOLOGY EXAM  12/11/1951  . TETANUS/TDAP  12/10/1960  . DEXA SCAN  12/11/2006  . PNA vac Low Risk Adult (1 of 2 - PCV13) 12/11/2006  . INFLUENZA VACCINE  Completed    Qualifies for Shingles Vaccine? Due for Shingles vaccine. Declined my offer to administer today. Education has been provided regarding the importance of this vaccine. Pt has been advised to call her insurance company to determine her out of pocket expense. Advised she may also receive this vaccine at her local  pharmacy or Health Dept. Verbalized acceptance and understanding.  Cancer Screenings: Lung: Low Dose CT Chest recommended if Age 47-80 years, 30 pack-year currently smoking OR have quit w/in 15years. Patient does not qualify. Breast: Up to date on Mammogram?  Awaiting previous records to update.  Up to date of Bone Density/Dexa? Awaiting previous records to update.  Colorectal: Awaiting previous records to update.   Additional Screenings:  Hepatitis C Screening: N/A     Plan:  I have personally reviewed and addressed the Medicare Annual Wellness questionnaire and have noted the following in the patient's chart:  A. Medical and social history B. Use of alcohol, tobacco or illicit drugs  C. Current medications and supplements D. Functional ability and status E.  Nutritional status F.  Physical activity G. Advance directives H. List of other physicians I.  Hospitalizations, surgeries, and ER visits in previous 12 months J.  [place] such as hearing and vision if needed, cognitive and depression L. Referrals and appointments - none  In addition, I have reviewed and discussed with patient certain preventive protocols, quality metrics, and best practice recommendations. A written personalized care plan for preventive services as well as general preventive health recommendations were provided to patient.  See attached scanned questionnaire for additional information.   Signed,  Fabio Neighbors, LPN Nurse Health Advisor   Nurse Recommendations: No previous records on file. Pt completed a record release form today for the [facility] in [place]. Will send to their clinic and await records to update HM.

## 2017-12-13 NOTE — Patient Instructions (Signed)
Megan Shelton , Thank you for taking time to come for your Medicare Wellness Visit. I appreciate your ongoing commitment to your health goals. Please review the following plan we discussed and let me know if I can assist you in the future.   Screening recommendations/referrals: Colonoscopy: Awaiting previous records to update.  Mammogram: Awaiting previous records to update.  Bone Density: Awaiting previous records to update.  Recommended yearly ophthalmology/optometry visit for glaucoma screening and checkup Recommended yearly dental visit for hygiene and checkup  Vaccinations: Influenza vaccine: Up to date Pneumococcal vaccine: Awaiting previous records to update.  Tdap vaccine: Awaiting previous records to update.  Shingles vaccine: Pt declines today.     Advanced directives: Advance directive discussed with you today. I have provided a copy for you to complete at home and have notarized. Once this is complete please bring a copy in to our office so we can scan it into your chart.  Conditions/risks identified: Recommend increasing water intake to 4-6 glasses a day.  Next appointment: 12/19/17 @ 9 AM with Fenton Malling.    Preventive Care 49 Years and Older, Female Preventive care refers to lifestyle choices and visits with your health care provider that can promote health and wellness. What does preventive care include?  A yearly physical exam. This is also called an annual well check.  Dental exams once or twice a year.  Routine eye exams. Ask your health care provider how often you should have your eyes checked.  Personal lifestyle choices, including:  Daily care of your teeth and gums.  Regular physical activity.  Eating a healthy diet.  Avoiding tobacco and drug use.  Limiting alcohol use.  Practicing safe sex.  Taking low-dose aspirin every day.  Taking vitamin and mineral supplements as recommended by your health care provider. What happens during an annual  well check? The services and screenings done by your health care provider during your annual well check will depend on your age, overall health, lifestyle risk factors, and family history of disease. Counseling  Your health care provider may ask you questions about your:  Alcohol use.  Tobacco use.  Drug use.  Emotional well-being.  Home and relationship well-being.  Sexual activity.  Eating habits.  History of falls.  Memory and ability to understand (cognition).  Work and work Statistician.  Reproductive health. Screening  You may have the following tests or measurements:  Height, weight, and BMI.  Blood pressure.  Lipid and cholesterol levels. These may be checked every 5 years, or more frequently if you are over 55 years old.  Skin check.  Lung cancer screening. You may have this screening every year starting at age 92 if you have a 30-pack-year history of smoking and currently smoke or have quit within the past 15 years.  Fecal occult blood test (FOBT) of the stool. You may have this test every year starting at age 38.  Flexible sigmoidoscopy or colonoscopy. You may have a sigmoidoscopy every 5 years or a colonoscopy every 10 years starting at age 62.  Hepatitis C blood test.  Hepatitis B blood test.  Sexually transmitted disease (STD) testing.  Diabetes screening. This is done by checking your blood sugar (glucose) after you have not eaten for a while (fasting). You may have this done every 1-3 years.  Bone density scan. This is done to screen for osteoporosis. You may have this done starting at age 44.  Mammogram. This may be done every 1-2 years. Talk to your health care  provider about how often you should have regular mammograms. Talk with your health care provider about your test results, treatment options, and if necessary, the need for more tests. Vaccines  Your health care provider may recommend certain vaccines, such as:  Influenza vaccine. This  is recommended every year.  Tetanus, diphtheria, and acellular pertussis (Tdap, Td) vaccine. You may need a Td booster every 10 years.  Zoster vaccine. You may need this after age 35.  Pneumococcal 13-valent conjugate (PCV13) vaccine. One dose is recommended after age 31.  Pneumococcal polysaccharide (PPSV23) vaccine. One dose is recommended after age 60. Talk to your health care provider about which screenings and vaccines you need and how often you need them. This information is not intended to replace advice given to you by your health care provider. Make sure you discuss any questions you have with your health care provider. Document Released: 04/22/2015 Document Revised: 12/14/2015 Document Reviewed: 01/25/2015 Elsevier Interactive Patient Education  2017 [place] Prevention in the Home Falls can cause injuries. They can happen to people of all ages. There are many things you can do to make your home safe and to help prevent falls. What can I do on the outside of my home?  Regularly fix the edges of walkways and driveways and fix any cracks.  Remove anything that might make you trip as you walk through a door, such as a raised step or threshold.  Trim any bushes or trees on the path to your home.  Use bright outdoor lighting.  Clear any walking paths of anything that might make someone trip, such as rocks or tools.  Regularly check to see if handrails are loose or broken. Make sure that both sides of any steps have handrails.  Any raised decks and porches should have guardrails on the edges.  Have any leaves, snow, or ice cleared regularly.  Use sand or salt on walking paths during winter.  Clean up any spills in your garage right away. This includes oil or grease spills. What can I do in the bathroom?  Use night lights.  Install grab bars by the toilet and in the tub and shower. Do not use towel bars as grab bars.  Use non-skid mats or decals in the tub or  shower.  If you need to sit down in the shower, use a plastic, non-slip stool.  Keep the floor dry. Clean up any water that spills on the floor as soon as it happens.  Remove soap buildup in the tub or shower regularly.  Attach bath mats securely with double-sided non-slip rug tape.  Do not have throw rugs and other things on the floor that can make you trip. What can I do in the bedroom?  Use night lights.  Make sure that you have a light by your bed that is easy to reach.  Do not use any sheets or blankets that are too big for your bed. They should not hang down onto the floor.  Have a firm chair that has side arms. You can use this for support while you get dressed.  Do not have throw rugs and other things on the floor that can make you trip. What can I do in the kitchen?  Clean up any spills right away.  Avoid walking on wet floors.  Keep items that you use a lot in easy-to-reach places.  If you need to reach something above you, use a strong step stool that has a grab bar.  Keep electrical cords out of the way.  Do not use floor polish or wax that makes floors slippery. If you must use wax, use non-skid floor wax.  Do not have throw rugs and other things on the floor that can make you trip. What can I do with my stairs?  Do not leave any items on the stairs.  Make sure that there are handrails on both sides of the stairs and use them. Fix handrails that are broken or loose. Make sure that handrails are as long as the stairways.  Check any carpeting to make sure that it is firmly attached to the stairs. Fix any carpet that is loose or worn.  Avoid having throw rugs at the top or bottom of the stairs. If you do have throw rugs, attach them to the floor with carpet tape.  Make sure that you have a light switch at the top of the stairs and the bottom of the stairs. If you do not have them, ask someone to add them for you. What else can I do to help prevent  falls?  Wear shoes that:  Do not have high heels.  Have rubber bottoms.  Are comfortable and fit you well.  Are closed at the toe. Do not wear sandals.  If you use a stepladder:  Make sure that it is fully opened. Do not climb a closed stepladder.  Make sure that both sides of the stepladder are locked into place.  Ask someone to hold it for you, if possible.  Clearly mark and make sure that you can see:  Any grab bars or handrails.  First and last steps.  Where the edge of each step is.  Use tools that help you move around (mobility aids) if they are needed. These include:  Canes.  Walkers.  Scooters.  Crutches.  Turn on the lights when you go into a dark area. Replace any light bulbs as soon as they burn out.  Set up your furniture so you have a clear path. Avoid moving your furniture around.  If any of your floors are uneven, fix them.  If there are any pets around you, be aware of where they are.  Review your medicines with your doctor. Some medicines can make you feel dizzy. This can increase your chance of falling. Ask your doctor what other things that you can do to help prevent falls. This information is not intended to replace advice given to you by your health care provider. Make sure you discuss any questions you have with your health care provider. Document Released: 01/20/2009 Document Revised: 09/01/2015 Document Reviewed: 04/30/2014 Elsevier Interactive Patient Education  2017 [employer].

## 2017-12-17 ENCOUNTER — Ambulatory Visit: Payer: Medicare Other | Admitting: *Deleted

## 2017-12-17 ENCOUNTER — Telehealth: Payer: Self-pay

## 2017-12-17 NOTE — Telephone Encounter (Signed)
Attempted to confirm remote transmission with pt. No answer and was unable to leave a message.   

## 2017-12-18 ENCOUNTER — Encounter: Payer: Self-pay | Admitting: Cardiology

## 2017-12-18 NOTE — Progress Notes (Signed)
Opened in error

## 2017-12-19 ENCOUNTER — Other Ambulatory Visit: Payer: Self-pay | Admitting: Physician Assistant

## 2017-12-19 ENCOUNTER — Ambulatory Visit (INDEPENDENT_AMBULATORY_CARE_PROVIDER_SITE_OTHER): Payer: Medicare Other | Admitting: Physician Assistant

## 2017-12-19 ENCOUNTER — Encounter: Payer: Self-pay | Admitting: Physician Assistant

## 2017-12-19 VITALS — BP 140/78 | HR 60 | Temp 98.1°F | Resp 16 | Ht 60.0 in | Wt 155.8 lb

## 2017-12-19 DIAGNOSIS — I10 Essential (primary) hypertension: Secondary | ICD-10-CM | POA: Diagnosis not present

## 2017-12-19 DIAGNOSIS — E538 Deficiency of other specified B group vitamins: Secondary | ICD-10-CM

## 2017-12-19 DIAGNOSIS — Z9889 Other specified postprocedural states: Secondary | ICD-10-CM

## 2017-12-19 DIAGNOSIS — E890 Postprocedural hypothyroidism: Secondary | ICD-10-CM

## 2017-12-19 DIAGNOSIS — I509 Heart failure, unspecified: Secondary | ICD-10-CM

## 2017-12-19 DIAGNOSIS — Z0000 Encounter for general adult medical examination without abnormal findings: Secondary | ICD-10-CM | POA: Diagnosis not present

## 2017-12-19 DIAGNOSIS — E1140 Type 2 diabetes mellitus with diabetic neuropathy, unspecified: Secondary | ICD-10-CM

## 2017-12-19 DIAGNOSIS — Z6830 Body mass index (BMI) 30.0-30.9, adult: Secondary | ICD-10-CM

## 2017-12-19 DIAGNOSIS — G5603 Carpal tunnel syndrome, bilateral upper limbs: Secondary | ICD-10-CM

## 2017-12-19 DIAGNOSIS — Z9009 Acquired absence of other part of head and neck: Secondary | ICD-10-CM

## 2017-12-19 NOTE — Progress Notes (Signed)
Patient: Megan Shelton, Female    DOB: 03/21/1941, 77 y.o.   MRN: [id number] Visit Date: 12/19/2017  Today's Provider: Mar Daring, PA-C   Chief Complaint  Patient presents with  . Annual Exam   Subjective:     Complete Physical Megan Shelton is a 77 y.o. female. She feels well. She reports exercising. She reports she is sleeping well.  Had AWV on 12/14/27.  Has had colonoscopy last year, father passed from colon cancer at 49. Had breast biopsy last year and all was negative. Reports having pneumonia vaccines.  Records requested again today.  -----------------------------------------------------------   Review of Systems  Constitutional: Negative.   HENT: Negative.   Eyes: Negative.   Respiratory: Negative.   Cardiovascular: Negative.   Gastrointestinal: Negative.   Endocrine: Negative.   Genitourinary: Negative.   Musculoskeletal: Negative.   Skin: Negative.   Allergic/Immunologic: Negative.   Neurological: Positive for numbness ("hands").  Hematological: Negative.   Psychiatric/Behavioral: The patient is nervous/anxious.     Social History   Socioeconomic History  . Marital status: Widowed    Spouse name: Not on file  . Number of children: 3  . Years of education: Not on file  . Highest education level: High school graduate  Occupational History  . Occupation: retired  Scientific laboratory technician  . Financial resource strain: Not hard at all  . Food insecurity:    Worry: Never true    Inability: Never true  . Transportation needs:    Medical: No    Non-medical: No  Tobacco Use  . Smoking status: Former Smoker    Types: Cigarettes  . Smokeless tobacco: Never Used  . Tobacco comment: quit about 18 years ago; as of 2019  Substance and Sexual Activity  . Alcohol use: No    Frequency: Never  . Drug use: No  . Sexual activity: Not on file  Lifestyle  . Physical activity:    Days per week: Not on file    Minutes per session: Not on file  .  Stress: Rather much  Relationships  . Social connections:    Talks on phone: Not on file    Gets together: Not on file    Attends religious service: Not on file    Active member of club or organization: Not on file    Attends meetings of clubs or organizations: Not on file    Relationship status: Not on file  . Intimate partner violence:    Fear of current or ex partner: Not on file    Emotionally abused: Not on file    Physically abused: Not on file    Forced sexual activity: Not on file  Other Topics Concern  . Not on file  Social History Narrative  . Not on file    Past Medical History:  Diagnosis Date  . Diabetes mellitus with neuropathy ([place])   . Hypertensive heart disease   . Implantable cardioverter-defibrillator (ICD)-[employer]   . Myocardial infarction, anterior wall ([place])   . Obesity, Class II, BMI 35-39.9      Patient Active Problem List   Diagnosis Date Noted  . Allergic rhinitis 05/03/2017  . Anxiety 05/03/2017  . Arthritis 05/03/2017  . Cataract 05/03/2017  . Congestive heart failure ([place]) 05/03/2017  . Diabetes mellitus without complication ([place]) 38/17/7116  . Hypertension 05/03/2017  . History of heart attack 05/03/2017  . H/O thyroidectomy 05/03/2017  . H/O total knee replacement 05/03/2017    Past Surgical History:  Procedure Laterality Date  . EYE SURGERY      Her family history includes Cancer (age of onset: 29) in her father; Heart disease in her brother and brother; Rashes / Skin problems in her mother.      Current Outpatient Medications:  .  aspirin 81 MG tablet, Take 81 mg by mouth daily., Disp: , Rfl:  .  atorvastatin (LIPITOR) 40 MG tablet, Take 40 mg by mouth daily., Disp: , Rfl:  .  Blood Glucose Monitoring Suppl (ONETOUCH VERIO IQ SYSTEM) w/Device KIT, To check blood sugar once daily., Disp: 1 kit, Rfl: 0 .  clonazePAM (KLONOPIN) 1 MG tablet, TAKE 1 TABLET BY MOUTH 3 TIMES A DAY AS NEEDED FOR ANXIETY, Disp: 90 tablet, Rfl: 5 .   Coenzyme Q10 (COQ10 PO), Take 1 capsule by mouth daily., Disp: , Rfl:  .  glimepiride (AMARYL) 1 MG tablet, Take 1 tablet (1 mg total) by mouth 2 (two) times daily., Disp: 180 tablet, Rfl: 1 .  glucose blood test strip, To check BS once daily, Disp: 100 each, Rfl: 12 .  levothyroxine (SYNTHROID, LEVOTHROID) 112 MCG tablet, Take 1 tablet (112 mcg total) by mouth daily before breakfast., Disp: 90 tablet, Rfl: 1 .  lisinopril (PRINIVIL,ZESTRIL) 10 MG tablet, Take 1 tablet (10 mg total) by mouth daily., Disp: 90 tablet, Rfl: 1 .  metFORMIN (GLUCOPHAGE-XR) 500 MG 24 hr tablet, Take 1 tablet by mouth in the mornings, and 2 tablets by mouth in the evenings., Disp: 270 tablet, Rfl: 1 .  metoprolol tartrate (LOPRESSOR) 25 MG tablet, Take 1 tablet (25 mg total) by mouth 2 (two) times daily., Disp: 180 tablet, Rfl: 3 .  ONETOUCH DELICA LANCETS 80H MISC, CHECK BLOOD SUGAR ONCE  DAILY, Disp: 100 each, Rfl: 11 .  sitaGLIPtin (JANUVIA) 100 MG tablet, Take 100 mg by mouth daily., Disp: , Rfl:  .  vitamin B-12 (CYANOCOBALAMIN) 100 MCG tablet, Take 100 mcg by mouth daily., Disp: , Rfl:   Patient Care Team: Mar Daring, PA-C as PCP - General (Family Medicine) Deboraha Sprang, MD as Consulting Physician (Cardiology)     Objective:   Vitals: BP 140/78 (BP Location: Left Arm, Patient Position: Sitting, Cuff Size: Normal)   Pulse 60   Temp 98.1 F (36.7 C) (Oral)   Resp 16   Ht 5' (1.524 m)   Wt 155 lb 12.8 oz (70.7 kg)   BMI 30.43 kg/m   Physical Exam  Constitutional: She is oriented to person, place, and time. She appears well-developed and well-nourished.  HENT:  Head: Normocephalic and atraumatic.  Right Ear: Hearing, tympanic membrane, external ear and ear canal normal.  Left Ear: Hearing, tympanic membrane, external ear and ear canal normal.  Nose: Nose normal.  Mouth/Throat: Oropharynx is clear and moist.  Eyes: Pupils are equal, round, and reactive to light. Conjunctivae and EOM are  normal.  Neck: Normal range of motion. Neck supple. Carotid bruit is not present.  Cardiovascular: Normal rate, regular rhythm, normal heart sounds and intact distal pulses.  Pulmonary/Chest: Effort normal and breath sounds normal.  Abdominal: Soft. Bowel sounds are normal.  Musculoskeletal: Normal range of motion.  Neurological: She is alert and oriented to person, place, and time.  Skin: Skin is warm and dry.  Psychiatric: She has a normal mood and affect. Her behavior is normal. Judgment and thought content normal.  Vitals reviewed.   Activities of Daily Living In your present state of health, do you have any difficulty performing the following  activities: 12/13/2017 05/03/2017  Hearing? N N  Vision? N Y  Difficulty concentrating or making decisions? N N  Walking or climbing stairs? N N  Dressing or bathing? N N  Doing errands, shopping? N N  Preparing Food and eating ? N -  Using the Toilet? N -  In the past six months, have you accidently leaked urine? N -  Do you have problems with loss of bowel control? N -  Managing your Medications? N -  Managing your Finances? N -  Housekeeping or managing your Housekeeping? N -  Some recent data might be hidden    Fall Risk Assessment Fall Risk  12/13/2017 05/03/2017  Falls in the past year? No No     Depression Screen PHQ 2/9 Scores 12/13/2017 05/03/2017  PHQ - 2 Score 0 0  PHQ- 9 Score - 6    6CIT Screen 12/13/2017  What Year? 0 points  What month? 0 points  What time? 0 points  Count back from 20 0 points  Months in reverse 0 points  Repeat phrase 2 points  Total Score 2     Assessment & Plan:    Annual Physical Reviewed patient's Family Medical History Reviewed and updated list of patient's medical providers Assessment of cognitive impairment was done Assessed patient's functional ability Established a written schedule for health screening [place] Completed and Reviewed  Exercise Activities and  Dietary recommendations Goals    . DIET - INCREASE WATER INTAKE     Recommend increasing water intake to 4-6 glasses a day.       Immunization History  Administered Date(s) Administered  . Influenza, High Dose Seasonal PF 12/13/2017    Health Maintenance  Topic Date Due  . HEMOGLOBIN A1C  11[birth year]41  . FOOT EXAM  12/11/1951  . OPHTHALMOLOGY EXAM  12/11/1951  . TETANUS/TDAP  12/10/1960  . DEXA SCAN  12/11/2006  . PNA vac Low Risk Adult (1 of 2 - PCV13) 12/11/2006  . INFLUENZA VACCINE  Completed     Discussed health benefits of physical activity, and encouraged her to engage in regular exercise appropriate for her age and condition.    1. Annual physical exam Normal physical exam today. Will check labs as below and f/u pending lab results. If labs are stable and WNL she will not need to have these rechecked for one year at her next annual physical exam. She is to call the office in the meantime if she has any acute issue, questions or concerns. - CBC with Differential/Platelet - Comprehensive metabolic panel - Hemoglobin A1c - Lipid panel - TSH  2. Congestive heart failure, unspecified HF chronicity, unspecified heart failure type ([place]) Stable. Continue lisinopril 54m, metoprolol 295mBID, ASA 8137mnd atorvastatin 65m65mill check labs as below and f/u pending results. - CBC with Differential/Platelet - Comprehensive metabolic panel - Lipid panel  3. Essential hypertension Stable. Continue lisinopril, metoprolol as prescribed. Will check labs as below and f/u pending results. - CBC with Differential/Platelet - Comprehensive metabolic panel - Hemoglobin A1c - Lipid panel  4. Type 2 diabetes mellitus with diabetic neuropathy, without long-term current use of insulin ([facility]) Stable. Continue Januvia 100mg65mtformin 500mg 26my and Glimepiride 1mg BI39mWill check labs as below and f/u pending results. - CBC with Differential/Platelet - Comprehensive metabolic panel -  Hemoglobin A1c - Lipid panel  5. H/O thyroidectomy Stable on levothyroxine 112mcg. 80m check labs as below and f/u pending results. - CBC with Differential/Platelet - Comprehensive  metabolic panel - TSH  6. B12 deficiency H/O this. Will check labs as below and f/u pending results. - B12  7. Bilateral carpal tunnel syndrome Complains of waking up with numbness and pain in hands bilaterally. Has to get up, shake her hands and sometimes run warm water on them to relieve pain and tingling. Discussed wearing cock-up wrist splints at bedtime. May use NSAIDs prn. Call if no improvements and will refer to ortho for consideration of steroid injections. Patient does not desire surgery consideration at this time.   8. BMI 30.0-30.9,adult Counseled patient on healthy lifestyle modifications including dieting and exercise.  - CBC with Differential/Platelet - Comprehensive metabolic panel - Hemoglobin A1c - Lipid panel  ------------------------------------------------------------------------------------------------------------    Mar Daring, PA-C  [facility]

## 2017-12-19 NOTE — Patient Instructions (Addendum)
Cock-Up wrist splint can buy at any pharmacy  Carpal Tunnel Syndrome Carpal tunnel syndrome is a condition that causes pain in your hand and arm. The carpal tunnel is a narrow area that is on the palm side of your wrist. Repeated wrist motion or certain diseases may cause swelling in the tunnel. This swelling can pinch the main nerve in the wrist (median nerve). Follow these instructions at home: If you have a splint:  Wear it as told by your doctor. Remove it only as told by your doctor.  Loosen the splint if your fingers: ? Become numb and tingle. ? Turn blue and cold.  Keep the splint clean and dry. General instructions  Take over-the-counter and prescription medicines only as told by your doctor.  Rest your wrist from any activity that may be causing your pain. If needed, talk to your employer about changes that can be made in your work, such as getting a wrist pad to use while typing.  If directed, apply ice to the painful area: ? Put ice in a plastic bag. ? Place a towel between your skin and the bag. ? Leave the ice on for 20 minutes, 2-3 times per day.  Keep all follow-up visits as told by your doctor. This is important.  Do any exercises as told by your doctor, physical therapist, or occupational therapist. Contact a doctor if:  You have new symptoms.  Medicine does not help your pain.  Your symptoms get worse. This information is not intended to replace advice given to you by your health care provider. Make sure you discuss any questions you have with your health care provider. Document Released: 03/15/2011 Document Revised: 09/01/2015 Document Reviewed: 08/11/2014 Elsevier Interactive Patient Education  2018 [employer] Maintenance for Postmenopausal Women Menopause is a normal process in which your reproductive ability comes to an end. This process happens gradually over a span of months to years, usually between the ages of 12 and 8. Menopause is  complete when you have missed 12 consecutive menstrual periods. It is important to talk with your health care provider about some of the most common conditions that affect postmenopausal women, such as heart disease, cancer, and bone loss (osteoporosis). Adopting a healthy lifestyle and getting preventive care can help to promote your health and wellness. Those actions can also lower your chances of developing some of these common conditions. What should I know about menopause? During menopause, you may experience a number of symptoms, such as:  Moderate-to-severe hot flashes.  Night sweats.  Decrease in sex drive.  Mood swings.  Headaches.  Tiredness.  Irritability.  Memory problems.  Insomnia.  Choosing to treat or not to treat menopausal changes is an individual decision that you make with your health care provider. What should I know about hormone replacement therapy and supplements? Hormone therapy products are effective for treating symptoms that are associated with menopause, such as hot flashes and night sweats. Hormone replacement carries certain risks, especially as you become older. If you are thinking about using estrogen or estrogen with progestin treatments, discuss the benefits and risks with your health care provider. What should I know about heart disease and stroke? Heart disease, heart attack, and stroke become more likely as you age. This may be due, in part, to the hormonal changes that your body experiences during menopause. These can affect how your body processes dietary fats, triglycerides, and cholesterol. Heart attack and stroke are both medical emergencies. There are many things  that you can do to help prevent heart disease and stroke:  Have your blood pressure checked at least every 1-2 years. High blood pressure causes heart disease and increases the risk of stroke.  If you are 32-86 years old, ask your health care provider if you should take aspirin to  prevent a heart attack or a stroke.  Do not use any tobacco products, including cigarettes, chewing tobacco, or electronic cigarettes. If you need help quitting, ask your health care provider.  It is important to eat a healthy diet and maintain a healthy weight. ? Be sure to include plenty of vegetables, fruits, low-fat dairy products, and lean protein. ? Avoid eating foods that are high in solid fats, added sugars, or salt (sodium).  Get regular exercise. This is one of the most important things that you can do for your health. ? Try to exercise for at least 150 minutes each week. The type of exercise that you do should increase your heart rate and make you sweat. This is known as moderate-intensity exercise. ? Try to do strengthening exercises at least twice each week. Do these in addition to the moderate-intensity exercise.  Know your numbers.Ask your health care provider to check your cholesterol and your blood glucose. Continue to have your blood tested as directed by your health care provider.  What should I know about cancer screening? There are several types of cancer. Take the following steps to reduce your risk and to catch any cancer development as early as possible. Breast Cancer  Practice breast self-awareness. ? This means understanding how your breasts normally appear and feel. ? It also means doing regular breast self-exams. Let your health care provider know about any changes, no matter how small.  If you are 30 or older, have a clinician do a breast exam (clinical breast exam or CBE) every year. Depending on your age, family history, and medical history, it may be recommended that you also have a yearly breast X-ray (mammogram).  If you have a family history of breast cancer, talk with your health care provider about genetic screening.  If you are at high risk for breast cancer, talk with your health care provider about having an MRI and a mammogram every year.  Breast  cancer (BRCA) gene test is recommended for women who have family members with BRCA-related cancers. Results of the assessment will determine the need for genetic counseling and BRCA1 and for BRCA2 testing. BRCA-related cancers include these types: ? Breast. This occurs in males or females. ? Ovarian. ? Tubal. This may also be called fallopian tube cancer. ? Cancer of the abdominal or pelvic lining (peritoneal cancer). ? Prostate. ? Pancreatic.  Cervical, Uterine, and Ovarian Cancer Your health care provider may recommend that you be screened regularly for cancer of the pelvic organs. These include your ovaries, uterus, and vagina. This screening involves a pelvic exam, which includes checking for microscopic changes to the surface of your cervix (Pap test).  For women ages 21-65, health care providers may recommend a pelvic exam and a Pap test every three years. For women ages 18-65, they may recommend the Pap test and pelvic exam, combined with testing for human papilloma virus (HPV), every five years. Some types of HPV increase your risk of cervical cancer. Testing for HPV may also be done on women of any age who have unclear Pap test results.  Other health care providers may not recommend any screening for nonpregnant women who are considered low risk for  pelvic cancer and have no symptoms. Ask your health care provider if a screening pelvic exam is right for you.  If you have had past treatment for cervical cancer or a condition that could lead to cancer, you need Pap tests and screening for cancer for at least 20 years after your treatment. If Pap tests have been discontinued for you, your risk factors (such as having a new sexual partner) need to be reassessed to determine if you should start having screenings again. Some women have medical problems that increase the chance of getting cervical cancer. In these cases, your health care provider may recommend that you have screening and Pap tests  more often.  If you have a family history of uterine cancer or ovarian cancer, talk with your health care provider about genetic screening.  If you have vaginal bleeding after reaching menopause, tell your health care provider.  There are currently no reliable tests available to screen for ovarian cancer.  Lung Cancer Lung cancer screening is recommended for adults 53-47 years old who are at high risk for lung cancer because of a history of smoking. A yearly low-dose CT scan of the lungs is recommended if you:  Currently smoke.  Have a history of at least 30 pack-years of smoking and you currently smoke or have quit within the past 15 years. A pack-year is smoking an average of one pack of cigarettes per day for one year.  Yearly screening should:  Continue until it has been 15 years since you quit.  Stop if you develop a health problem that would prevent you from having lung cancer treatment.  Colorectal Cancer  This type of cancer can be detected and can often be prevented.  Routine colorectal cancer screening usually begins at age 28 and continues through age 29.  If you have risk factors for colon cancer, your health care provider may recommend that you be screened at an earlier age.  If you have a family history of colorectal cancer, talk with your health care provider about genetic screening.  Your health care provider may also recommend using home test kits to check for hidden blood in your stool.  A small camera at the end of a tube can be used to examine your colon directly (sigmoidoscopy or colonoscopy). This is done to check for the earliest forms of colorectal cancer.  Direct examination of the colon should be repeated every 5-10 years until age 56. However, if early forms of precancerous polyps or small growths are found or if you have a family history or genetic risk for colorectal cancer, you may need to be screened more often.  Skin Cancer  Check your skin from  head to toe regularly.  Monitor any moles. Be sure to tell your health care provider: ? About any new moles or changes in moles, especially if there is a change in a mole's shape or color. ? If you have a mole that is larger than the size of a pencil eraser.  If any of your family members has a history of skin cancer, especially at a young age, talk with your health care provider about genetic screening.  Always use sunscreen. Apply sunscreen liberally and repeatedly throughout the day.  Whenever you are outside, protect yourself by wearing long sleeves, pants, a wide-brimmed hat, and sunglasses.  What should I know about osteoporosis? Osteoporosis is a condition in which bone destruction happens more quickly than new bone creation. After menopause, you may be at an  increased risk for osteoporosis. To help prevent osteoporosis or the bone fractures that can happen because of osteoporosis, the following is recommended:  If you are 19-67 years old, get at least 1,000 mg of calcium and at least 600 mg of vitamin D per day.  If you are older than age 100 but younger than age 64, get at least 1,200 mg of calcium and at least 600 mg of vitamin D per day.  If you are older than age 1, get at least 1,200 mg of calcium and at least 800 mg of vitamin D per day.  Smoking and excessive alcohol intake increase the risk of osteoporosis. Eat foods that are rich in calcium and vitamin D, and do weight-bearing exercises several times each week as directed by your health care provider. What should I know about how menopause affects my mental health? Depression may occur at any age, but it is more common as you become older. Common symptoms of depression include:  Low or sad mood.  Changes in sleep patterns.  Changes in appetite or eating patterns.  Feeling an overall lack of motivation or enjoyment of activities that you previously enjoyed.  Frequent crying spells.  Talk with your health care  provider if you think that you are experiencing depression. What should I know about immunizations? It is important that you get and maintain your immunizations. These include:  Tetanus, diphtheria, and pertussis (Tdap) booster vaccine.  Influenza every year before the flu season begins.  Pneumonia vaccine.  Shingles vaccine.  Your health care provider may also recommend other immunizations. This information is not intended to replace advice given to you by your health care provider. Make sure you discuss any questions you have with your health care provider. Document Released: 05/18/2005 Document Revised: 10/14/2015 Document Reviewed: 12/28/2014 Elsevier Interactive Patient Education  2018 [employer].

## 2017-12-20 ENCOUNTER — Telehealth: Payer: Self-pay

## 2017-12-20 ENCOUNTER — Ambulatory Visit (INDEPENDENT_AMBULATORY_CARE_PROVIDER_SITE_OTHER): Payer: Medicare Other | Admitting: *Deleted

## 2017-12-20 DIAGNOSIS — I255 Ischemic cardiomyopathy: Secondary | ICD-10-CM | POA: Diagnosis not present

## 2017-12-20 LAB — CBC WITH DIFFERENTIAL/PLATELET
BASOS: 0 %
Basophils Absolute: 0 10*3/uL (ref 0.0–0.2)
EOS (ABSOLUTE): 0.3 10*3/uL (ref 0.0–0.4)
EOS: 4 %
HEMATOCRIT: 45.6 % (ref 34.0–46.6)
HEMOGLOBIN: 14.8 g/dL (ref 11.1–15.9)
IMMATURE GRANS (ABS): 0 10*3/uL (ref 0.0–0.1)
IMMATURE GRANULOCYTES: 0 %
LYMPHS: 25 %
Lymphocytes Absolute: 1.8 10*3/uL (ref 0.7–3.1)
MCH: 28.7 pg (ref 26.6–33.0)
MCHC: 32.5 g/dL (ref 31.5–35.7)
MCV: 88 fL (ref 79–97)
MONOCYTES: 8 %
Monocytes Absolute: 0.5 10*3/uL (ref 0.1–0.9)
Neutrophils Absolute: 4.3 10*3/uL (ref 1.4–7.0)
Neutrophils: 63 %
PLATELETS: 195 10*3/uL (ref 150–450)
RBC: 5.16 x10E6/uL (ref 3.77–5.28)
RDW: 14.4 % (ref 12.3–15.4)
WBC: 7 10*3/uL (ref 3.4–10.8)

## 2017-12-20 LAB — COMPREHENSIVE METABOLIC PANEL
ALBUMIN: 4.5 g/dL (ref 3.5–4.8)
ALT: 19 IU/L (ref 0–32)
AST: 19 IU/L (ref 0–40)
Albumin/Globulin Ratio: 1.9 (ref 1.2–2.2)
Alkaline Phosphatase: 64 IU/L (ref 39–117)
BUN / CREAT RATIO: 23 (ref 12–28)
BUN: 16 mg/dL (ref 8–27)
Bilirubin Total: 0.3 mg/dL (ref 0.0–1.2)
CALCIUM: 9.5 mg/dL (ref 8.7–10.3)
CO2: 24 mmol/L (ref 20–29)
Chloride: 96 mmol/L (ref 96–106)
Creatinine, Ser: 0.69 mg/dL (ref 0.57–1.00)
GFR, EST AFRICAN AMERICAN: 98 mL/min/{1.73_m2} (ref >59)
GFR, EST NON AFRICAN AMERICAN: 85 mL/min/{1.73_m2} (ref >59)
GLOBULIN, TOTAL: 2.4 g/dL (ref 1.5–4.5)
Glucose: 65 mg/dL (ref 65–99)
POTASSIUM: 4 mmol/L (ref 3.5–5.2)
Sodium: 138 mmol/L (ref 134–144)
TOTAL PROTEIN: 6.9 g/dL (ref 6.0–8.5)

## 2017-12-20 LAB — VITAMIN B12: VITAMIN B 12: 828 pg/mL (ref 232–1245)

## 2017-12-20 LAB — LIPID PANEL
CHOL/HDL RATIO: 2.6 ratio (ref 0.0–4.4)
Cholesterol, Total: 123 mg/dL (ref 100–199)
HDL: 47 mg/dL (ref >39)
LDL Calculated: 62 mg/dL (ref 0–99)
Triglycerides: 68 mg/dL (ref 0–149)
VLDL CHOLESTEROL CAL: 14 mg/dL (ref 5–40)

## 2017-12-20 LAB — HEMOGLOBIN A1C
Est. average glucose Bld gHb Est-mCnc: 131 mg/dL
HEMOGLOBIN A1C: 6.2 % — AB (ref 4.8–5.6)

## 2017-12-20 LAB — TSH: TSH: 0.268 u[IU]/mL — ABNORMAL LOW (ref 0.450–4.500)

## 2017-12-20 NOTE — Telephone Encounter (Signed)
Patient advised as below. Patient reports that she wants to continue same dose of levothyroxine.

## 2017-12-20 NOTE — Telephone Encounter (Signed)
-----   Message from Mar Daring, PA-C sent at 12/20/2017  9:54 AM EDT ----- Blood count is normal. Kidney and liver function are normal. Cholesterol normal. A1c is 6.2. Thyroid is slightly overcorrected. May benefit from a decrease in levothyroxine. If agreeable I will send in. B12 is normal.

## 2017-12-20 NOTE — Telephone Encounter (Signed)
Patient advised as below. Mailed out lab results to patient, per request.

## 2017-12-20 NOTE — Telephone Encounter (Signed)
Noted and thank you

## 2017-12-21 NOTE — Progress Notes (Signed)
Remote ICD transmission.   

## 2017-12-22 ENCOUNTER — Other Ambulatory Visit: Payer: Self-pay | Admitting: Physician Assistant

## 2017-12-22 DIAGNOSIS — E039 Hypothyroidism, unspecified: Secondary | ICD-10-CM

## 2017-12-22 DIAGNOSIS — E119 Type 2 diabetes mellitus without complications: Secondary | ICD-10-CM

## 2017-12-22 DIAGNOSIS — I10 Essential (primary) hypertension: Secondary | ICD-10-CM

## 2017-12-23 ENCOUNTER — Encounter: Payer: Self-pay | Admitting: Cardiology

## 2017-12-24 ENCOUNTER — Other Ambulatory Visit: Payer: Self-pay

## 2017-12-24 MED ORDER — ATORVASTATIN CALCIUM 40 MG PO TABS: 40.0000 mg | ORAL_TABLET | Freq: Every day | ORAL | 3 refills | Status: DC

## 2017-12-24 NOTE — Telephone Encounter (Signed)
Refill request received from [facility] requesting Lipitor 40 mg.

## 2018-01-04 LAB — CUP PACEART REMOTE DEVICE CHECK
Battery Remaining Longevity: 107 mo
HIGH POWER IMPEDANCE MEASURED VALUE: 76 Ohm
Implantable Lead Implant Date: 20080121
Implantable Pulse Generator Implant Date: 20160415
Lead Channel Pacing Threshold Amplitude: 1 V
Lead Channel Pacing Threshold Pulse Width: 0.4 ms
Lead Channel Sensing Intrinsic Amplitude: 20.75 mV
Lead Channel Setting Pacing Pulse Width: 0.4 ms
Lead Channel Setting Sensing Sensitivity: 0.3 mV
MDC IDC LEAD LOCATION: 753860
MDC IDC LEAD SERIAL: 107986
MDC IDC MSMT BATTERY VOLTAGE: 3.01 V
MDC IDC MSMT LEADCHNL RV IMPEDANCE VALUE: 494 Ohm
MDC IDC MSMT LEADCHNL RV IMPEDANCE VALUE: 532 Ohm
MDC IDC MSMT LEADCHNL RV SENSING INTR AMPL: 20.75 mV
MDC IDC SESS DTM: 20190913181515
MDC IDC SET LEADCHNL RV PACING AMPLITUDE: 2.25 V
MDC IDC STAT BRADY RV PERCENT PACED: 0.05 %

## 2018-01-27 ENCOUNTER — Telehealth: Payer: Self-pay | Admitting: Physician Assistant

## 2018-01-27 DIAGNOSIS — G5603 Carpal tunnel syndrome, bilateral upper limbs: Secondary | ICD-10-CM

## 2018-01-27 NOTE — Telephone Encounter (Signed)
Pt agree for referral

## 2018-01-27 NOTE — Telephone Encounter (Signed)
I would have her see Dr. Peggye Ley in town. Is she ok with this referral?

## 2018-01-27 NOTE — Telephone Encounter (Signed)
Referral placed.

## 2018-01-27 NOTE — Telephone Encounter (Signed)
Pt has discussed with Tawanna Sat the carpel tunnel pain.  Pt now needing Tawanna Sat to recommend someone for her carpel tunnel pain and issues.  Please advise.  Thanks, [employer]

## 2018-01-27 NOTE — Telephone Encounter (Signed)
Please Review

## 2018-01-28 ENCOUNTER — Other Ambulatory Visit: Payer: Self-pay | Admitting: Physician Assistant

## 2018-01-28 NOTE — Telephone Encounter (Signed)
[facility] faxed refill request for the following medications:  Lasix tab  This was all the information the fax gave.  Please advise.

## 2018-01-28 NOTE — Telephone Encounter (Signed)
Please review

## 2018-01-29 MED ORDER — FUROSEMIDE 40 MG PO TABS: 40.0000 mg | ORAL_TABLET | Freq: Every day | ORAL | 0 refills | Status: DC | PRN

## 2018-01-29 NOTE — Telephone Encounter (Signed)
The Lasix is 40 mg.  She only takes this prn.  Pt's CB#  [phone]  Thanks  Con Memos

## 2018-01-29 NOTE — Telephone Encounter (Signed)
Patient advised.

## 2018-01-29 NOTE — Telephone Encounter (Signed)
Lasix sent to [name], Dionne Bucy, MD, MPH [facility] 01/29/2018 1:09 PM

## 2018-01-29 NOTE — Telephone Encounter (Signed)
Need to know dose of Lasix and instructions. Medication is not in the chart.   LMTCB

## 2018-01-29 NOTE — Telephone Encounter (Signed)
There is no record of dose of Lasix or that it has been prescribed.  Does seem she has CHF, so she probably does take it, but I don't know the dose.  Her PCP is on vacation.  Perhaps she should call cardiology for a refill and they will have record of dose  Alassane Kalafut, Dionne Bucy, MD, MPH [facility] 01/29/2018 9:52 AM

## 2018-02-05 ENCOUNTER — Telehealth: Payer: Self-pay

## 2018-02-05 NOTE — Telephone Encounter (Signed)
Referred to [facility] clinic by Chanetta Marshall, NP due to Optivol impedance has suggested fluid accumulation through September.  Attempted ICM intro call to patient.  Left message with direct number to return call.

## 2018-02-05 NOTE — Telephone Encounter (Signed)
Spoke with patient and agreeable to monthly follow up.  Advised monitor should be by bedside in order for it to automatically transmit a report during sleep hours of 12 midnight and 6 AM.  Patient confirmed monitor is at bedside. Advised will receive a call after the transmission is reviewed to provide results.  Explained a Remote Home Transmission will be seen as daytime appointment on office summary visit sheet but the time is not relevant since all transmissions are sent at night time so there is no obligation to stay by the monitor at the daytime appt. Provided ICM number and explained should call if experiencing any fluid symptoms such as weight gain, shortness of breath or extremity/abdominal swelling.

## 2018-02-27 ENCOUNTER — Ambulatory Visit (INDEPENDENT_AMBULATORY_CARE_PROVIDER_SITE_OTHER): Payer: Medicare Other

## 2018-02-27 DIAGNOSIS — Z95810 Presence of automatic (implantable) cardiac defibrillator: Secondary | ICD-10-CM

## 2018-02-27 DIAGNOSIS — I255 Ischemic cardiomyopathy: Secondary | ICD-10-CM | POA: Diagnosis not present

## 2018-02-27 NOTE — Progress Notes (Signed)
EPIC Encounter for ICM Monitoring  Patient Name: Megan Shelton is a 77 y.o. female Date: 02/27/2018 Primary Care Physican: Mar Daring, PA-C Primary Cardiologist: Caryl Comes Electrophysiologist: Caryl Comes Last Weight: 153 lbs       1st remote.  Heart Failure questions reviewed, pt asymptomatic.  She drinks a lot of coffee.  She will be traveling the next 2 weeks.    Thoracic impedance abnormal suggesting fluid accumulation starting 02/11/2018.   Prescribed: Furosemide 40 mg take 1 tablet by mouth as needed  Labs: 12/19/2017 Creatinine 0.69, BUN 16, Potassium 4.0, Sodium 138, eGFR 85-98  Recommendations:  Advised to take Furosemide 0.5 tablet x 1 day.  Discussed limiting salt intake to < 2000 mg daily and fluid intake to 64 oz daily.  Encouraged to call for fluid symptoms.  Follow-up plan: ICM clinic phone appointment on 03/20/2018 to recheck fluid levels.     Copy of ICM check sent to Dr. Caryl Comes.   3 month ICM trend: 02/27/2018    1 Year ICM trend:       Rosalene Billings, RN 02/27/2018 5:05 PM

## 2018-03-04 ENCOUNTER — Other Ambulatory Visit: Payer: Self-pay | Admitting: Family Medicine

## 2018-03-20 ENCOUNTER — Ambulatory Visit (INDEPENDENT_AMBULATORY_CARE_PROVIDER_SITE_OTHER): Payer: Medicare Other

## 2018-03-20 DIAGNOSIS — Z95810 Presence of automatic (implantable) cardiac defibrillator: Secondary | ICD-10-CM

## 2018-03-20 DIAGNOSIS — I255 Ischemic cardiomyopathy: Secondary | ICD-10-CM

## 2018-03-20 NOTE — Progress Notes (Signed)
EPIC Encounter for ICM Monitoring  Patient Name: Megan Shelton is a 77 y.o. female Date: 03/20/2018 Primary Care Physican: Mar Daring, PA-C Primary Cardiologist: Caryl Comes Electrophysiologist: Caryl Comes Last Weight: 153 lbs but stopped weighing at home                                                   Heart Failure questions reviewed, pt asymptomatic.  She does not adhere to low salt diet.  Uses salt shaker to give foods flavored. Discussed using herbs and spices instead of salt and to read food labels for salt content.  Advised to limit salt intake to 2000 mg daily.    Thoracic impedance abnormal suggesting fluid accumulation starting 02/11/2018.   Prescribed: Furosemide 40 mg take 1 tablet by mouth as needed  Labs: 12/19/2017 Creatinine 0.69, BUN 16, Potassium 4.0, Sodium 138, eGFR 85-98  Recommendations:  Advised to take Furosemide 40 mg 1 tablet x 3 days and then return to PRN.  Follow-up plan: ICM clinic phone appointment on 03/24/2018 to recheck fluid levels.     Copy of ICM check sent to Dr. Caryl Comes.  3 month ICM trend: 03/20/2018    1 Year ICM trend:       Rosalene Billings, RN 03/20/2018 10:21 AM

## 2018-03-24 ENCOUNTER — Telehealth: Payer: Self-pay | Admitting: Cardiology

## 2018-03-24 ENCOUNTER — Ambulatory Visit (INDEPENDENT_AMBULATORY_CARE_PROVIDER_SITE_OTHER): Payer: Medicare Other

## 2018-03-24 DIAGNOSIS — I255 Ischemic cardiomyopathy: Secondary | ICD-10-CM

## 2018-03-24 DIAGNOSIS — Z95810 Presence of automatic (implantable) cardiac defibrillator: Secondary | ICD-10-CM

## 2018-03-24 NOTE — Progress Notes (Signed)
EPIC Encounter for ICM Monitoring  Patient Name: Megan Shelton is a 77 y.o. female Date: 03/24/2018 Primary Care Physican: Mar Daring, PA-C Primary Cardiologist:Klein Electrophysiologist:Klein Last Weight: 153 lbs but stopped weighing at home   Heart Failure questions reviewed, pt asymptomatic. Reviewed heart failure symptoms.She does not adhere to low salt diet.     Thoracic impedance returned to normal after taking 3 days of PRN Furosemide  Prescribed: Furosemide40 mg take 1 tablet by mouth as needed  Labs: 12/19/2017 Creatinine 0.69, BUN 16, Potassium 4.0, Sodium 138, eGFR 85-98  Recommendations:None today.  Encouraged to call if she has any fluid symptoms that are not resolved by Furosemide.  Follow-up plan: ICM clinic phone appointment on12/30/2019.   Copy of ICM check sent to [facility].   3 month ICM trend: 03/24/2018    1 Year ICM trend:       Megan Billings, RN 03/24/2018 4:25 PM

## 2018-03-24 NOTE — Progress Notes (Signed)
Remote ICD transmission.   

## 2018-03-24 NOTE — Telephone Encounter (Signed)
Spoke with pt and reminded pt of remote transmission that is due today. Pt verbalized understanding.   

## 2018-04-07 ENCOUNTER — Ambulatory Visit (INDEPENDENT_AMBULATORY_CARE_PROVIDER_SITE_OTHER): Payer: Medicare Other

## 2018-04-07 DIAGNOSIS — I255 Ischemic cardiomyopathy: Secondary | ICD-10-CM | POA: Diagnosis not present

## 2018-04-07 DIAGNOSIS — Z95810 Presence of automatic (implantable) cardiac defibrillator: Secondary | ICD-10-CM | POA: Diagnosis not present

## 2018-04-07 NOTE — Progress Notes (Signed)
  Patient Name: Megan Shelton is a 77 y.o. female Date: 04/07/2018 Primary Care Physican: Mar Daring, PA-C Primary Cardiologist:Klein Electrophysiologist:Klein Last Weight:153 lbs but does not weigh at home Today's Weight:  unknown       Spoke with patient.  Heart failure questions reviewed.  She is asymptomatic.    OPTIVOL REPORT:  Thoracic impedance abnormal suggesting fluid accumulation since 04/05/2019.      PRESCRIBED: Furosemide40 mg take 1 tablet by mouth as needed   Labs:  12/19/2017 Creatinine 0.69, BUN 16, Potassium 4.0, Sodium 138, eGFR 85-98  RECOMMENDATIONS:  Advised to take Furosemide 40 mg 1 tablet x 3 days and then return to PRN  Follow up plan: Next ICM phone appointment 04/11/2018 (Manual send) to recheck fluid levels.   Copy of report sent to Dr Caryl Comes.     3 month ICM trend: 04/07/2018    1 Year ICM trend:       Rosalene Billings, RN 04/07/2018 11:53 AM

## 2018-04-08 ENCOUNTER — Other Ambulatory Visit: Payer: Self-pay | Admitting: Physician Assistant

## 2018-04-11 ENCOUNTER — Ambulatory Visit (INDEPENDENT_AMBULATORY_CARE_PROVIDER_SITE_OTHER): Payer: Medicare Other

## 2018-04-11 DIAGNOSIS — I255 Ischemic cardiomyopathy: Secondary | ICD-10-CM

## 2018-04-11 DIAGNOSIS — Z95810 Presence of automatic (implantable) cardiac defibrillator: Secondary | ICD-10-CM

## 2018-04-11 NOTE — Progress Notes (Signed)
EPIC Encounter for ICM Monitoring  Patient Name: Megan Shelton is a 77 y.o. female Date: 04/11/2018 Primary Care Physican: Burnette, Jennifer M, PA-C Primary Cardiologist:Klein Electrophysiologist:Klein Last Weight:153 lbs (does not weigh at home) Today's Weight:  unknown                                                   Spoke with patient.  Heart failure questions reviewed.  She is asymptomatic.    OPTIVOL REPORT:  Thoracic impedance returned to normal after taking PRN Furosemide x 3 days.           PRESCRIBED: Furosemide40 mg take 1 tablet by mouth as needed        Labs:       12/19/2017 Creatinine 0.69, BUN 16, Potassium 4.0, Sodium 138, eGFR 85-98  RECOMMENDATIONS:  None, encouraged to call for fluid symptoms  Follow up plan: Next ICM phone appointment 05/08/2018.        Copy of report sent to Dr Klein.         3 month ICM trend: 04/11/2018                1 Year ICM trend:             Laurie S Short, RN 04/11/2018 7:55 AM   

## 2018-04-22 LAB — HM DIABETES EYE EXAM

## 2018-05-01 LAB — CUP PACEART REMOTE DEVICE CHECK
Battery Remaining Longevity: 103 mo
HighPow Impedance: 86 Ohm
Implantable Lead Implant Date: 20080121
Implantable Lead Location: 753860
Implantable Lead Serial Number: 107986
Lead Channel Impedance Value: 494 Ohm
Lead Channel Pacing Threshold Amplitude: 1 V
Lead Channel Pacing Threshold Pulse Width: 0.4 ms
Lead Channel Sensing Intrinsic Amplitude: 22 mV
Lead Channel Setting Pacing Pulse Width: 0.4 ms
Lead Channel Setting Sensing Sensitivity: 0.3 mV
MDC IDC MSMT BATTERY VOLTAGE: 2.99 V
MDC IDC MSMT LEADCHNL RV IMPEDANCE VALUE: 456 Ohm
MDC IDC MSMT LEADCHNL RV SENSING INTR AMPL: 22 mV
MDC IDC PG IMPLANT DT: 20160415
MDC IDC SESS DTM: 20191212072723
MDC IDC SET LEADCHNL RV PACING AMPLITUDE: 2.25 V
MDC IDC STAT BRADY RV PERCENT PACED: 0.1 %

## 2018-05-08 ENCOUNTER — Ambulatory Visit (INDEPENDENT_AMBULATORY_CARE_PROVIDER_SITE_OTHER): Payer: Medicare Other

## 2018-05-08 DIAGNOSIS — Z95810 Presence of automatic (implantable) cardiac defibrillator: Secondary | ICD-10-CM | POA: Diagnosis not present

## 2018-05-08 DIAGNOSIS — I255 Ischemic cardiomyopathy: Secondary | ICD-10-CM

## 2018-05-09 NOTE — Progress Notes (Signed)
EPIC Encounter for ICM Monitoring  Patient Name: Megan Shelton is a 78 y.o. female Date: 05/09/2018 Primary Care Physican: Mar Daring, PA-C Primary Cardiologist:Klein Electrophysiologist:Klein Last Weight:153 lbs (does not weigh athome) Today's Weight: unknown       Heart Failure questions reviewed. She reported she is doing fine but did take PRN Furosemide for last 2 days even though she did not have any symptoms.  Pt asymptomatic.   Report: Thoracic impedance normal but was abnormal for 2 days in the past week.   Prescribed: Furosemide40 mg take 1 tablet by mouth as needed  Labs: 12/19/2017 Creatinine 0.69, BUN 16, Potassium 4.0, Sodium 138, eGFR 85-98  Recommendations: No changes.  Reinforced limiting salt intake to < 2000 mg daily.  Encouraged to call for fluid symptoms.  Follow-up plan: ICM clinic phone appointment on 06/10/2018.   Office appointment scheduled 06/24/2018 with Dr. Caryl Comes.    Copy of ICM check sent to Dr. Caryl Comes.   3 month ICM trend: 05/09/2018    1 Year ICM trend:       Rosalene Billings, RN 05/09/2018 8:46 AM

## 2018-05-10 ENCOUNTER — Other Ambulatory Visit: Payer: Self-pay | Admitting: Physician Assistant

## 2018-05-16 ENCOUNTER — Encounter: Payer: Self-pay | Admitting: Physician Assistant

## 2018-05-16 ENCOUNTER — Ambulatory Visit (INDEPENDENT_AMBULATORY_CARE_PROVIDER_SITE_OTHER): Payer: Medicare Other | Admitting: Physician Assistant

## 2018-05-16 VITALS — BP 113/70 | HR 63 | Temp 97.5°F | Resp 16 | Wt 160.2 lb

## 2018-05-16 DIAGNOSIS — E890 Postprocedural hypothyroidism: Secondary | ICD-10-CM

## 2018-05-16 DIAGNOSIS — F411 Generalized anxiety disorder: Secondary | ICD-10-CM

## 2018-05-16 DIAGNOSIS — E1140 Type 2 diabetes mellitus with diabetic neuropathy, unspecified: Secondary | ICD-10-CM

## 2018-05-16 DIAGNOSIS — I10 Essential (primary) hypertension: Secondary | ICD-10-CM

## 2018-05-16 DIAGNOSIS — Z9009 Acquired absence of other part of head and neck: Secondary | ICD-10-CM

## 2018-05-16 MED ORDER — CLONAZEPAM 1 MG PO TABS: ORAL_TABLET | ORAL | 5 refills | Status: DC

## 2018-05-16 NOTE — Patient Instructions (Signed)
DASH Eating Plan  DASH stands for "Dietary Approaches to Stop Hypertension." The DASH eating plan is a healthy eating plan that has been shown to reduce high blood pressure (hypertension). It may also reduce your risk for type 2 diabetes, heart disease, and stroke. The DASH eating plan may also help with weight loss.  What are tips for following this plan?    General guidelines   Avoid eating more than 2,300 mg (milligrams) of salt (sodium) a day. If you have hypertension, you may need to reduce your sodium intake to 1,500 mg a day.   Limit alcohol intake to no more than 1 drink a day for nonpregnant women and 2 drinks a day for men. One drink equals 12 oz of beer, 5 oz of wine, or 1 oz of hard liquor.   Work with your health care provider to maintain a healthy body weight or to lose weight. Ask what an ideal weight is for you.   Get at least 30 minutes of exercise that causes your heart to beat faster (aerobic exercise) most days of the week. Activities may include walking, swimming, or biking.   Work with your health care provider or diet and nutrition specialist (dietitian) to adjust your eating plan to your individual calorie needs.  Reading food labels     Check food labels for the amount of sodium per serving. Choose foods with less than 5 percent of the Daily Value of sodium. Generally, foods with less than 300 mg of sodium per serving fit into this eating plan.   To find whole grains, look for the word "whole" as the first word in the ingredient list.  Shopping   Buy products labeled as "low-sodium" or "no salt added."   Buy fresh foods. Avoid canned foods and premade or frozen meals.  Cooking   Avoid adding salt when cooking. Use salt-free seasonings or herbs instead of table salt or sea salt. Check with your health care provider or pharmacist before using salt substitutes.   Do not fry foods. Cook foods using healthy methods such as baking, boiling, grilling, and broiling instead.   Cook with  heart-healthy oils, such as olive, canola, soybean, or sunflower oil.  Meal planning   Eat a balanced diet that includes:  ? 5 or more servings of fruits and vegetables each day. At each meal, try to fill half of your plate with fruits and vegetables.  ? Up to 6-8 servings of whole grains each day.  ? Less than 6 oz of lean meat, poultry, or fish each day. A 3-oz serving of meat is about the same size as a deck of cards. One egg equals 1 oz.  ? 2 servings of low-fat dairy each day.  ? A serving of nuts, seeds, or beans 5 times each week.  ? Heart-healthy fats. Healthy fats called Omega-3 fatty acids are found in foods such as flaxseeds and coldwater fish, like sardines, salmon, and mackerel.   Limit how much you eat of the following:  ? Canned or prepackaged foods.  ? Food that is high in trans fat, such as fried foods.  ? Food that is high in saturated fat, such as fatty meat.  ? Sweets, desserts, sugary drinks, and other foods with added sugar.  ? Full-fat dairy products.   Do not salt foods before eating.   Try to eat at least 2 vegetarian meals each week.   Eat more home-cooked food and less restaurant, buffet, and fast food.     When eating at a restaurant, ask that your food be prepared with less salt or no salt, if possible.  What foods are recommended?  The items listed may not be a complete list. Talk with your dietitian about what dietary choices are best for you.  Grains  Whole-grain or whole-wheat bread. Whole-grain or whole-wheat pasta. Brown rice. Oatmeal. Quinoa. Bulgur. Whole-grain and low-sodium cereals. Pita bread. Low-fat, low-sodium crackers. Whole-wheat flour tortillas.  Vegetables  Fresh or frozen vegetables (raw, steamed, roasted, or grilled). Low-sodium or reduced-sodium tomato and vegetable juice. Low-sodium or reduced-sodium tomato sauce and tomato paste. Low-sodium or reduced-sodium canned vegetables.  Fruits  All fresh, dried, or frozen fruit. Canned fruit in natural juice (without  added sugar).  Meat and other protein foods  Skinless chicken or turkey. Ground chicken or turkey. Pork with fat trimmed off. Fish and seafood. Egg whites. Dried beans, peas, or lentils. Unsalted nuts, nut butters, and seeds. Unsalted canned beans. Lean cuts of beef with fat trimmed off. Low-sodium, lean deli meat.  Dairy  Low-fat (1%) or fat-free (skim) milk. Fat-free, low-fat, or reduced-fat cheeses. Nonfat, low-sodium ricotta or cottage cheese. Low-fat or nonfat yogurt. Low-fat, low-sodium cheese.  Fats and oils  Soft margarine without trans fats. Vegetable oil. Low-fat, reduced-fat, or light mayonnaise and salad dressings (reduced-sodium). Canola, safflower, olive, soybean, and sunflower oils. Avocado.  Seasoning and other foods  Herbs. Spices. Seasoning mixes without salt. Unsalted popcorn and pretzels. Fat-free sweets.  What foods are not recommended?  The items listed may not be a complete list. Talk with your dietitian about what dietary choices are best for you.  Grains  Baked goods made with fat, such as croissants, muffins, or some breads. Dry pasta or rice meal packs.  Vegetables  Creamed or fried vegetables. Vegetables in a cheese sauce. Regular canned vegetables (not low-sodium or reduced-sodium). Regular canned tomato sauce and paste (not low-sodium or reduced-sodium). Regular tomato and vegetable juice (not low-sodium or reduced-sodium). Pickles. Olives.  Fruits  Canned fruit in a light or heavy syrup. Fried fruit. Fruit in cream or butter sauce.  Meat and other protein foods  Fatty cuts of meat. Ribs. Fried meat. Bacon. Sausage. Bologna and other processed lunch meats. Salami. Fatback. Hotdogs. Bratwurst. Salted nuts and seeds. Canned beans with added salt. Canned or smoked fish. Whole eggs or egg yolks. Chicken or turkey with skin.  Dairy  Whole or 2% milk, cream, and half-and-half. Whole or full-fat cream cheese. Whole-fat or sweetened yogurt. Full-fat cheese. Nondairy creamers. Whipped toppings.  Processed cheese and cheese spreads.  Fats and oils  Butter. Stick margarine. Lard. Shortening. Ghee. Bacon fat. Tropical oils, such as coconut, palm kernel, or palm oil.  Seasoning and other foods  Salted popcorn and pretzels. Onion salt, garlic salt, seasoned salt, table salt, and sea salt. Worcestershire sauce. Tartar sauce. Barbecue sauce. Teriyaki sauce. Soy sauce, including reduced-sodium. Steak sauce. Canned and packaged gravies. Fish sauce. Oyster sauce. Cocktail sauce. Horseradish that you find on the shelf. Ketchup. Mustard. Meat flavorings and tenderizers. Bouillon cubes. Hot sauce and Tabasco sauce. Premade or packaged marinades. Premade or packaged taco seasonings. Relishes. Regular salad dressings.  Where to find more information:   National Heart, Lung, and Blood Institute: www.nhlbi.nih.gov   American Heart Association: www.heart.org  Summary   The DASH eating plan is a healthy eating plan that has been shown to reduce high blood pressure (hypertension). It may also reduce your risk for type 2 diabetes, heart disease, and stroke.   With the   DASH eating plan, you should limit salt (sodium) intake to 2,300 mg a day. If you have hypertension, you may need to reduce your sodium intake to 1,500 mg a day.   When on the DASH eating plan, aim to eat more fresh fruits and vegetables, whole grains, lean proteins, low-fat dairy, and heart-healthy fats.   Work with your health care provider or diet and nutrition specialist (dietitian) to adjust your eating plan to your individual calorie needs.  This information is not intended to replace advice given to you by your health care provider. Make sure you discuss any questions you have with your health care provider.  Document Released: 03/15/2011 Document Revised: 03/19/2016 Document Reviewed: 03/19/2016  Elsevier Interactive Patient Education  2019 Elsevier Inc.

## 2018-05-16 NOTE — Progress Notes (Signed)
Patient: Megan Shelton Female    DOB: 05-16-1940   78 y.o.   MRN: [id number] Visit Date: 05/16/2018  Today's Provider: Mar Daring, PA-C   No chief complaint on file.  Subjective:     HPI   Diabetes Mellitus Type II, Follow-up:   Lab Results  Component Value Date   HGBA1C 6.2 (H) 12/19/2017    Last seen for diabetes 5 months ago.  Management since then includes none. She reports excellent compliance with treatment. She is not having side effects.  Current symptoms include none and have been stable. Home blood sugar records: 110's-100  Episodes of hypoglycemia? no   Current Insulin Regimen: none Most Recent Eye Exam: 04/2018 Weight trend: stable Prior visit with dietician: yes - weight watchers Current diet: in general, a "healthy" diet   Current exercise: water aerobics  Pertinent Labs:    Component Value Date/Time   CHOL 123 12/19/2017 1002   TRIG 68 12/19/2017 1002   HDL 47 12/19/2017 1002   LDLCALC 62 12/19/2017 1002   CREATININE 0.69 12/19/2017 1002    Wt Readings from Last 3 Encounters:  05/16/18 160 lb 3.2 oz (72.7 kg)  12/19/17 155 lb 12.8 oz (70.7 kg)  12/13/17 156 lb 6.4 oz (70.9 kg)   ------------------------------------------------------------------------ Patient also reports that she will need refills on the Klonopin. Patient reports that she is stable with her symptoms.  Allergies  Allergen Reactions  . Latex   . Nickel      Current Outpatient Medications:  .  aspirin 81 MG tablet, Take 81 mg by mouth daily., Disp: , Rfl:  .  atorvastatin (LIPITOR) 40 MG tablet, Take 1 tablet (40 mg total) by mouth daily., Disp: 90 tablet, Rfl: 3 .  Blood Glucose Monitoring Suppl (ONETOUCH VERIO IQ SYSTEM) w/Device KIT, To check blood sugar once daily., Disp: 1 kit, Rfl: 0 .  clonazePAM (KLONOPIN) 1 MG tablet, TAKE 1 TABLET BY MOUTH 3 TIMES A DAY AS NEEDED FOR ANXIETY, Disp: 90 tablet, Rfl: 5 .  Coenzyme Q10 (COQ10 PO), Take 1 capsule by  mouth daily., Disp: , Rfl:  .  furosemide (LASIX) 40 MG tablet, TAKE 1 TABLET BY MOUTH EVERY DAY AS NEEDED, Disp: 90 tablet, Rfl: 1 .  glimepiride (AMARYL) 1 MG tablet, TAKE 1 TABLET BY MOUTH TWO  TIMES DAILY, Disp: 180 tablet, Rfl: 3 .  glucose blood test strip, To check BS once daily, Disp: 100 each, Rfl: 12 .  levothyroxine (SYNTHROID, LEVOTHROID) 112 MCG tablet, TAKE 1 TABLET BY MOUTH  DAILY BEFORE BREAKFAST, Disp: 90 tablet, Rfl: 3 .  lisinopril (PRINIVIL,ZESTRIL) 10 MG tablet, TAKE 1 TABLET BY MOUTH  DAILY, Disp: 90 tablet, Rfl: 3 .  metFORMIN (GLUCOPHAGE-XR) 500 MG 24 hr tablet, TAKE 1 TABLET BY MOUTH IN  THE MORNING AND 2 TABLETS  IN THE EVENING, Disp: 270 tablet, Rfl: 3 .  metoprolol tartrate (LOPRESSOR) 25 MG tablet, Take 1 tablet (25 mg total) by mouth 2 (two) times daily., Disp: 180 tablet, Rfl: 3 .  ONETOUCH DELICA LANCETS 20F MISC, CHECK BLOOD SUGAR ONCE  DAILY, Disp: 100 each, Rfl: 11 .  sitaGLIPtin (JANUVIA) 100 MG tablet, Take 100 mg by mouth daily., Disp: , Rfl:  .  vitamin B-12 (CYANOCOBALAMIN) 100 MCG tablet, Take 100 mcg by mouth daily., Disp: , Rfl:   Review of Systems  Constitutional: Negative.   Respiratory: Negative.   Cardiovascular: Negative.   Gastrointestinal: Negative.   Neurological: Negative.   Psychiatric/Behavioral:  Negative.     Social History   Tobacco Use  . Smoking status: Former Smoker    Types: Cigarettes  . Smokeless tobacco: Never Used  . Tobacco comment: quit about 18 years ago; as of 2019  Substance Use Topics  . Alcohol use: No    Frequency: Never      Objective:   BP 113/70 (BP Location: Left Arm, Patient Position: Sitting, Cuff Size: Large)   Pulse 63   Temp (!) 97.5 F (36.4 C) (Oral)   Resp 16   Wt 160 lb 3.2 oz (72.7 kg)   BMI 31.29 kg/m  Vitals:   05/16/18 0715  BP: 113/70  Pulse: 63  Resp: 16  Temp: (!) 97.5 F (36.4 C)  TempSrc: Oral  Weight: 160 lb 3.2 oz (72.7 kg)     Physical Exam Vitals signs reviewed.    Constitutional:      General: She is not in acute distress.    Appearance: She is well-developed. She is not diaphoretic.  Neck:     Musculoskeletal: Normal range of motion and neck supple.     Thyroid: No thyromegaly.  Cardiovascular:     Rate and Rhythm: Normal rate and regular rhythm.     Heart sounds: Normal heart sounds. No murmur. No friction rub. No gallop.   Pulmonary:     Effort: Pulmonary effort is normal. No respiratory distress.     Breath sounds: Normal breath sounds. No wheezing or rales.        Assessment & Plan    1. Essential hypertension Stable. Continue lisinopril 88m, metoprolol 253mBID. Will check labs as below and f/u pending results. Return in 6 months for CPE.  - CBC with Differential/Platelet - Comprehensive metabolic panel - Hemoglobin A1c - Lipid panel  2. Type 2 diabetes mellitus with diabetic neuropathy, without long-term current use of insulin (HCC) Stable. Continue metformin 50071mR, glimepiride 1mg62mill check labs as below and f/u pending results. - CBC with Differential/Platelet - Comprehensive metabolic panel - Hemoglobin A1c - Lipid panel  3. H/O thyroidectomy Stable on levothyroxine 112mc21mill check labs as below and f/u pending results. - TSH  4. GAD (generalized anxiety disorder) Stable. Diagnosis pulled for medication refill. Continue current medical treatment plan. - clonazePAM (KLONOPIN) 1 MG tablet; TAKE 1 TABLET BY MOUTH 3 TIMES A DAY AS NEEDED FOR ANXIETY  Dispense: 90 tablet; Refill: 5    LithiumC  BurliArcadiap

## 2018-05-17 LAB — COMPREHENSIVE METABOLIC PANEL
ALBUMIN: 4.7 g/dL (ref 3.7–4.7)
ALT: 22 IU/L (ref 0–32)
AST: 19 IU/L (ref 0–40)
Albumin/Globulin Ratio: 2 (ref 1.2–2.2)
Alkaline Phosphatase: 66 IU/L (ref 39–117)
BUN / CREAT RATIO: 32 — AB (ref 12–28)
BUN: 24 mg/dL (ref 8–27)
Bilirubin Total: 0.5 mg/dL (ref 0.0–1.2)
CO2: 21 mmol/L (ref 20–29)
CREATININE: 0.74 mg/dL (ref 0.57–1.00)
Calcium: 9.6 mg/dL (ref 8.7–10.3)
Chloride: 98 mmol/L (ref 96–106)
GFR, EST AFRICAN AMERICAN: 91 mL/min/{1.73_m2} (ref >59)
GFR, EST NON AFRICAN AMERICAN: 79 mL/min/{1.73_m2} (ref >59)
GLUCOSE: 136 mg/dL — AB (ref 65–99)
Globulin, Total: 2.4 g/dL (ref 1.5–4.5)
Potassium: 4.4 mmol/L (ref 3.5–5.2)
Sodium: 140 mmol/L (ref 134–144)
TOTAL PROTEIN: 7.1 g/dL (ref 6.0–8.5)

## 2018-05-17 LAB — CBC WITH DIFFERENTIAL/PLATELET
Basophils Absolute: 0 10*3/uL (ref 0.0–0.2)
Basos: 1 %
EOS (ABSOLUTE): 0.3 10*3/uL (ref 0.0–0.4)
EOS: 5 %
HEMOGLOBIN: 15.5 g/dL (ref 11.1–15.9)
Hematocrit: 47.2 % — ABNORMAL HIGH (ref 34.0–46.6)
Immature Grans (Abs): 0 10*3/uL (ref 0.0–0.1)
Immature Granulocytes: 1 %
LYMPHS ABS: 1.6 10*3/uL (ref 0.7–3.1)
Lymphs: 26 %
MCH: 28.9 pg (ref 26.6–33.0)
MCHC: 32.8 g/dL (ref 31.5–35.7)
MCV: 88 fL (ref 79–97)
MONOCYTES: 10 %
Monocytes Absolute: 0.6 10*3/uL (ref 0.1–0.9)
NEUTROS ABS: 3.7 10*3/uL (ref 1.4–7.0)
Neutrophils: 57 %
Platelets: 246 10*3/uL (ref 150–450)
RBC: 5.36 x10E6/uL — ABNORMAL HIGH (ref 3.77–5.28)
RDW: 14.6 % (ref 11.7–15.4)
WBC: 6.3 10*3/uL (ref 3.4–10.8)

## 2018-05-17 LAB — LIPID PANEL
CHOL/HDL RATIO: 3.1 ratio (ref 0.0–4.4)
Cholesterol, Total: 173 mg/dL (ref 100–199)
HDL: 55 mg/dL (ref >39)
LDL CALC: 92 mg/dL (ref 0–99)
TRIGLYCERIDES: 131 mg/dL (ref 0–149)
VLDL Cholesterol Cal: 26 mg/dL (ref 5–40)

## 2018-05-17 LAB — TSH: TSH: 1.75 u[IU]/mL (ref 0.450–4.500)

## 2018-05-17 LAB — HEMOGLOBIN A1C
Est. average glucose Bld gHb Est-mCnc: 128 mg/dL
Hgb A1c MFr Bld: 6.1 % — ABNORMAL HIGH (ref 4.8–5.6)

## 2018-05-19 ENCOUNTER — Telehealth: Payer: Self-pay

## 2018-05-19 ENCOUNTER — Encounter: Payer: Self-pay | Admitting: Physician Assistant

## 2018-05-19 NOTE — Telephone Encounter (Signed)
LVMTRC 

## 2018-05-19 NOTE — Telephone Encounter (Signed)
-----   Message from Mar Daring, PA-C sent at 05/19/2018  7:33 AM EST ----- Blood count is normal. Kidney and liver function normal. A1c stable at 6.1 (had been 6.2). Cholesterol normal. Thyroid normal.

## 2018-05-19 NOTE — Telephone Encounter (Signed)
Patient returned call and was advised.

## 2018-06-10 ENCOUNTER — Ambulatory Visit (INDEPENDENT_AMBULATORY_CARE_PROVIDER_SITE_OTHER): Payer: Medicare Other

## 2018-06-10 DIAGNOSIS — I255 Ischemic cardiomyopathy: Secondary | ICD-10-CM

## 2018-06-10 DIAGNOSIS — Z95810 Presence of automatic (implantable) cardiac defibrillator: Secondary | ICD-10-CM | POA: Diagnosis not present

## 2018-06-11 NOTE — Progress Notes (Signed)
EPIC Encounter for ICM Monitoring  Patient Name: Megan Shelton is a 78 y.o. female Date: 06/11/2018 Primary Care Physican: Mar Daring, PA-C Primary Cardiologist:Klein Electrophysiologist:Klein Last QIXMDE:006 lbs(does not weigh athome)                                                           Heart Failure questions reviewed. Fluid levels will be rechecked 06/24/2018 in the office by Dr Caryl Comes.  Pt asymptomatic.   Report: Thoracic impedance abnormal suggesting fluid accumulation since 05/31/2018.   Prescribed: Furosemide40 mg take 1 tablet by mouth as needed  Labs: 12/19/2017 Creatinine 0.69, BUN 16, Potassium 4.0, Sodium 138, eGFR 85-98  Recommendations:   Reinforced limiting salt intake to < 2000 mg daily. Advised to call if she experiences fluid symptoms.  Follow-up plan: ICM clinic phone appointment on 07/28/2018.   Office appointment scheduled with Dr End 06/16/2018 and 06/24/2018 with Dr. Caryl Comes.    Copy of ICM check sent to Dr. Caryl Comes.  3 month ICM trend: 06/10/2018    1 Year ICM trend:       Rosalene Billings, RN 06/11/2018 11:00 AM

## 2018-06-16 ENCOUNTER — Other Ambulatory Visit: Payer: Self-pay | Admitting: Physician Assistant

## 2018-06-16 ENCOUNTER — Ambulatory Visit: Payer: Medicare Other | Admitting: Internal Medicine

## 2018-06-16 DIAGNOSIS — I10 Essential (primary) hypertension: Secondary | ICD-10-CM

## 2018-06-16 DIAGNOSIS — I509 Heart failure, unspecified: Secondary | ICD-10-CM

## 2018-06-23 ENCOUNTER — Ambulatory Visit (INDEPENDENT_AMBULATORY_CARE_PROVIDER_SITE_OTHER): Payer: Medicare Other | Admitting: *Deleted

## 2018-06-23 ENCOUNTER — Other Ambulatory Visit: Payer: Self-pay

## 2018-06-23 DIAGNOSIS — I255 Ischemic cardiomyopathy: Secondary | ICD-10-CM

## 2018-06-24 ENCOUNTER — Encounter: Payer: Medicare Other | Admitting: Internal Medicine

## 2018-06-24 LAB — CUP PACEART REMOTE DEVICE CHECK
Battery Remaining Longevity: 99 mo
Battery Voltage: 3 V
Brady Statistic RV Percent Paced: 0.28 %
Date Time Interrogation Session: 20200316052202
HighPow Impedance: 96 Ohm
Implantable Lead Implant Date: 20080121
Implantable Lead Location: 753860
Implantable Lead Model: 180
Implantable Lead Serial Number: 107986
Implantable Pulse Generator Implant Date: 20160415
Lead Channel Impedance Value: 532 Ohm
Lead Channel Pacing Threshold Amplitude: 1 V
Lead Channel Pacing Threshold Pulse Width: 0.4 ms
Lead Channel Sensing Intrinsic Amplitude: 26.5 mV
Lead Channel Setting Pacing Amplitude: 2.25 V
Lead Channel Setting Pacing Pulse Width: 0.4 ms
Lead Channel Setting Sensing Sensitivity: 0.3 mV
MDC IDC MSMT LEADCHNL RV IMPEDANCE VALUE: 513 Ohm
MDC IDC MSMT LEADCHNL RV SENSING INTR AMPL: 26.5 mV

## 2018-06-30 NOTE — Progress Notes (Signed)
Remote ICD transmission.   

## 2018-07-28 ENCOUNTER — Other Ambulatory Visit: Payer: Self-pay

## 2018-07-28 ENCOUNTER — Ambulatory Visit (INDEPENDENT_AMBULATORY_CARE_PROVIDER_SITE_OTHER): Payer: Medicare Other

## 2018-07-28 DIAGNOSIS — I255 Ischemic cardiomyopathy: Secondary | ICD-10-CM

## 2018-07-28 DIAGNOSIS — Z95810 Presence of automatic (implantable) cardiac defibrillator: Secondary | ICD-10-CM | POA: Diagnosis not present

## 2018-07-28 NOTE — Progress Notes (Signed)
EPIC Encounter for ICM Monitoring  Patient Name: Megan Shelton is a 78 y.o. female Date: 07/28/2018 Primary Care Physican: Mar Daring, PA-C Primary Cardiologist:Klein Electrophysiologist:Klein Last QQVZDG:387 lbs(does not weigh athome) 07/28/2018 Weight: unknown   Heart Failure questions reviewed. Pt asymptomatic.  Optivol Thoracic impedance normal.   Prescribed:Furosemide40 mg take 1 tablet by mouth as needed  Labs: 12/19/2017 Creatinine 0.69, BUN 16, Potassium 4.0, Sodium 138, GFR 85-98  Recommendations:  Reinforced limiting salt intake to <2000 mg daily. Advised to call if she experiences fluid symptoms.  Follow-up plan: ICM clinic phone appointment on5/26/2020.  Visit with Dr Caryl Comes scheduled 08/19/2018  Copy of ICM check sent to [place].  3 month ICM trend: 07/28/2018    1 Year ICM trend:       Rosalene Billings, RN 07/28/2018 2:45 PM

## 2018-08-04 ENCOUNTER — Telehealth: Payer: Self-pay

## 2018-08-04 NOTE — Telephone Encounter (Signed)
LMOM  To change appointment with Dr. Caryl Comes 08/19/18 10:30 to E-Visit.

## 2018-08-08 NOTE — Telephone Encounter (Signed)
Virtual Visit Pre-Appointment Phone Call    Confirm consent - "In the setting of the current Covid19 crisis, you are scheduled for a (phone or video) visit with your provider on (date) at (time).  Just as we do with many in-office visits, in order for you to participate in this visit, we must obtain consent.  If you'd like, I can send this to your mychart (if signed up) or email for you to review.  Otherwise, I can obtain your verbal consent now.  All virtual visits are billed to your insurance company just like a normal visit would be.  By agreeing to a virtual visit, we'd like you to understand that the technology does not allow for your provider to perform an examination, and thus may limit your provider's ability to fully assess your condition. If your provider identifies any concerns that need to be evaluated in person, we will make arrangements to do so.  Finally, though the technology is pretty good, we cannot assure that it will always work on either your or our end, and in the setting of a video visit, we may have to convert it to a phone-only visit.  In either situation, we cannot ensure that we have a secure connection.  Are you willing to proceed?"  YES  Advise patient to be prepared - "Two hours prior to your appointment, go ahead and check your blood pressure, pulse, oxygen saturation, and your weight (if you have the equipment to check those) and write them all down. When your visit starts, your provider will ask you for this information. If you have an Apple Watch or Kardia device, please plan to have heart rate information ready on the day of your appointment. Please have a pen and paper handy nearby the day of the visit as well."  Inform patient they will receive a phone call 15 minutes prior to their appointment time (may be from unknown caller ID) so they should be prepared to answer    TELEPHONE CALL NOTE  Nirali Magouirk has been deemed a candidate for a follow-up  tele-health visit to limit community exposure during the Covid-19 pandemic. I spoke with the patient via phone to ensure availability of phone/video source, confirm preferred email & phone number, and discuss instructions and expectations.  I reminded Yenni Carra to be prepared with any vital sign and/or heart rhythm information that could potentially be obtained via home monitoring, at the time of her visit. I reminded Amia Cubero to expect a phone call prior to her visit.  Alba Destine, RMA 08/08/2018 10:02 AM   IF USING DOXIMITY or DOXY.ME - The patient will receive a link just prior to their visit by text.     FULL LENGTH CONSENT FOR TELE-HEALTH VISIT   I hereby voluntarily request, consent and authorize [facility] and its employed or contracted physicians, physician assistants, nurse practitioners or other licensed health care professionals (the Practitioner), to provide me with telemedicine health care services (the Services") as deemed necessary by the treating Practitioner. I acknowledge and consent to receive the Services by the Practitioner via telemedicine. I understand that the telemedicine visit will involve communicating with the Practitioner through live audiovisual communication technology and the disclosure of certain medical information by electronic transmission. I acknowledge that I have been given the opportunity to request an in-person assessment or other available alternative prior to the telemedicine visit and am voluntarily participating in the telemedicine visit.  I understand that I have the right  to withhold or withdraw my consent to the use of telemedicine in the course of my care at any time, without affecting my right to future care or treatment, and that the Practitioner or I may terminate the telemedicine visit at any time. I understand that I have the right to inspect all information obtained and/or recorded in the course of the telemedicine visit and  may receive copies of available information for a reasonable fee.  I understand that some of the potential risks of receiving the Services via telemedicine include:   Delay or interruption in medical evaluation due to technological equipment failure or disruption;  Information transmitted may not be sufficient (e.g. poor resolution of images) to allow for appropriate medical decision making by the Practitioner; and/or   In rare instances, security protocols could fail, causing a breach of personal health information.  Furthermore, I acknowledge that it is my responsibility to provide information about my medical history, conditions and care that is complete and accurate to the best of my ability. I acknowledge that Practitioner's advice, recommendations, and/or decision may be based on factors not within their control, such as incomplete or inaccurate data provided by me or distortions of diagnostic images or specimens that may result from electronic transmissions. I understand that the practice of medicine is not an exact science and that Practitioner makes no warranties or guarantees regarding treatment outcomes. I acknowledge that I will receive a copy of this consent concurrently upon execution via email to the email address I last provided but may also request a printed copy by calling the office of [facility].    I understand that my insurance will be billed for this visit.   I have read or had this consent read to me.  I understand the contents of this consent, which adequately explains the benefits and risks of the Services being provided via telemedicine.   I have been provided ample opportunity to ask questions regarding this consent and the Services and have had my questions answered to my satisfaction.  I give my informed consent for the services to be provided through the use of telemedicine in my medical care  By participating in this telemedicine visit I agree to the above.

## 2018-08-19 ENCOUNTER — Other Ambulatory Visit: Payer: Self-pay

## 2018-08-19 ENCOUNTER — Telehealth: Payer: Self-pay | Admitting: Internal Medicine

## 2018-08-19 ENCOUNTER — Telehealth (INDEPENDENT_AMBULATORY_CARE_PROVIDER_SITE_OTHER): Payer: Medicare Other | Admitting: Internal Medicine

## 2018-08-19 VITALS — Ht 60.0 in | Wt 155.0 lb

## 2018-08-19 DIAGNOSIS — Z95810 Presence of automatic (implantable) cardiac defibrillator: Secondary | ICD-10-CM

## 2018-08-19 DIAGNOSIS — I255 Ischemic cardiomyopathy: Secondary | ICD-10-CM

## 2018-08-19 NOTE — Telephone Encounter (Signed)
Pt had a Telehealth visit with Dr. Caryl Comes this morning. During the visit, Dr. Caryl Comes mentioned that her Blood work was good for her age. She just wanted to make sure that everything is in fact good.  She was just worried because of the way she interpreted Dr. Olin Pia 'good'

## 2018-08-19 NOTE — Patient Instructions (Signed)
Medication Instructions:  - Your physician recommends that you continue on your current medications as directed. Please refer to the Current Medication list given to you today.  If you need a refill on your cardiac medications before your next appointment, please call your pharmacy.   Lab work: - none ordered  If you have labs (blood work) drawn today and your tests are completely normal, you will receive your results only by: Marland Kitchen MyChart Message (if you have MyChart) OR . A paper copy in the mail If you have any lab test that is abnormal or we need to change your treatment, we will call you to review the results.  Testing/Procedures: - none ordered  Follow-Up: At [facility], you and your health needs are our priority.  As part of our continuing mission to provide you with exceptional heart care, we have created designated Provider Care Teams.  These Care Teams include your primary Cardiologist (physician) and Advanced Practice Providers (APPs -  Physician Assistants and Nurse Practitioners) who all work together to provide you with the care you need, when you need it. . You will need a follow up appointment in 6 months (November) with Dr. Caryl Comes. Marland Kitchen Please call our office 2 months in advance to schedule this appointment.  (call in early September to schedule).  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 This monitoring reduces the number of office visits required to check your device to one time per year. It allows [place] to keep an eye on the functioning of your device to ensure it is working properly. You are scheduled for a device check from home on 09/02/18 (Fluid check with Margarita Grizzle) & 09/22/18 (full download). You may send your transmission at any time that day. If you have a wireless device, the transmission will be sent automatically. After your physician reviews your transmission, you will receive a postcard with your next transmission date.   Any Other Special  Instructions Will Be Listed Below (If Applicable). - N/A

## 2018-08-19 NOTE — Progress Notes (Signed)
Electrophysiology TeleHealth Note   Due to national recommendations of social distancing due to COVID 19, an audio/video telehealth visit is felt to be most appropriate for this patient at this time.  See MyChart message from today for the patient's consent to telehealth for [facility].   Date:  08/19/2018   ID:  Megan Shelton, DOB [birth year], MRN [id number]  Location: patient's home  Provider location: [address], [place]  Evaluation Performed: Follow-up visit  PCP:  Megan Daring, PA-C  Cardiologist:   New Electrophysiologist:  SK   Chief Complaint: ischemic cardiomyopathy  History of Present Illness:    Megan Shelton is a 78 y.o. female who presents via audio/video conferencing for a telehealth visit today.  Since last being seen in our clinic, the patient reports doing well.   Date Cr K Hgb  2/20 0.74 4.4 15.5         The patient denies SOB, chest pain edema or palpitations.  There has been no syncope or presyncope.     The patient denies symptoms of fevers, chills, cough, or new SOB worrisome for COVID 19.    Past Medical History:  Diagnosis Date  . Diabetes mellitus with neuropathy (Megan Shelton)   . Hypertensive heart disease   . Implantable cardioverter-defibrillator (ICD)-Medtronic   . Myocardial infarction, anterior wall (Megan Shelton)   . Obesity, Class II, BMI 35-39.9     Past Surgical History:  Procedure Laterality Date  . EYE SURGERY      Current Outpatient Medications  Medication Sig Dispense Refill  . aspirin 81 MG tablet Take 81 mg by mouth daily.    Marland Kitchen atorvastatin (LIPITOR) 40 MG tablet Take 1 tablet (40 mg total) by mouth daily. 90 tablet 3  . Blood Glucose Monitoring Suppl (ONETOUCH VERIO IQ SYSTEM) w/Device KIT To check blood sugar once daily. 1 kit 0  . clonazePAM (KLONOPIN) 1 MG tablet TAKE 1 TABLET BY MOUTH 3 TIMES A DAY AS NEEDED FOR ANXIETY 90 tablet 5  . Coenzyme Q10 (COQ10 PO) Take 1 capsule by mouth daily.    .  furosemide (LASIX) 40 MG tablet TAKE 1 TABLET BY MOUTH EVERY DAY AS NEEDED 90 tablet 1  . glimepiride (AMARYL) 1 MG tablet TAKE 1 TABLET BY MOUTH TWO  TIMES DAILY 180 tablet 3  . glucose blood test strip To check BS once daily 100 each 12  . levothyroxine (SYNTHROID, LEVOTHROID) 112 MCG tablet TAKE 1 TABLET BY MOUTH  DAILY BEFORE BREAKFAST 90 tablet 3  . lisinopril (PRINIVIL,ZESTRIL) 10 MG tablet TAKE 1 TABLET BY MOUTH  DAILY 90 tablet 3  . metFORMIN (GLUCOPHAGE-XR) 500 MG 24 hr tablet TAKE 1 TABLET BY MOUTH IN  THE MORNING AND 2 TABLETS  IN THE EVENING 270 tablet 3  . metoprolol tartrate (LOPRESSOR) 25 MG tablet TAKE 1 TABLET BY MOUTH TWO  TIMES DAILY 180 tablet 3  . ONETOUCH DELICA LANCETS 50D MISC CHECK BLOOD SUGAR ONCE  DAILY 100 each 11  . sitaGLIPtin (JANUVIA) 100 MG tablet Take 100 mg by mouth daily.    . vitamin B-12 (CYANOCOBALAMIN) 100 MCG tablet Take 100 mcg by mouth daily.     No current facility-administered medications for this visit.     Allergies:   Latex and Nickel   Social History:  The patient  reports that she has quit smoking. Her smoking use included cigarettes. She has never used smokeless tobacco. She reports that she does not drink alcohol or use drugs.  Family History:  The patient's   family history includes Cancer (age of onset: 72) in her father; Heart disease in her brother and brother; Rashes / Skin problems in her mother.   ROS:  Please see the history of present illness.   All other systems are personally reviewed and negative.    Exam:    Vital Signs:  Ht 5' (1.524 m)   Wt 155 lb (70.3 kg)   BMI 30.27 kg/m     Well appearing, alert and conversant, regular work of breathing,  good skin color Eyes- anicteric, neuro- grossly intact, skin- no apparent rash or lesions or cyanosis, mouth- oral mucosa is pink   Labs/Other Tests and Data Reviewed:    Recent Labs: 05/16/2018: ALT 22; BUN 24; Creatinine, Ser 0.74; Hemoglobin 15.5; Platelets 246; Potassium  4.4; Sodium 140; TSH 1.750   Personally reviewed    Wt Readings from Last 3 Encounters:  08/19/18 155 lb (70.3 kg)  05/16/18 160 lb 3.2 oz (72.7 kg)  12/19/17 155 lb 12.8 oz (70.7 kg)     Other studies personally reviewed: Additional studies/ records that were reviewed today include: *As above    Last device remote is reviewed from [place] PDF dated 3/20 which reveals normal device function,   arrhythmias - none     ASSESSMENT & PLAN:    Ischemic cardiomyopathy with interval normalization of LV function  Implantable defibrillator Medtronic primary prevention (presumed)  Congestive heart failure-chronic-mixed class II  Hgb elevated   Euvolemic continue current meds  Without symptoms of ischemia  Will want to follow the Hgb along   Seems high in an elderly woman.       COVID 19 screen The patient denies symptoms of COVID 19 at this time.  The importance of social distancing was discussed today.  Follow-up:  *71m Next remote: As Scheduled   Current medicines are reviewed at length with the patient today.   The patient does not have concerns regarding her medicines.  The following changes were made today:  none  Labs/ tests ordered today include:   No orders of the defined types were placed in this encounter.   Future tests ( post COVID )     Patient Risk:  after full review of this patients clinical status, I feel that they are at moderate risk at this time.  Today, I have spent 11 minutes with the patient with telehealth technology discussing the above.  Signed, SVirl Axe MD  08/19/2018 10:33 AM     [place] [phone](office) [phone](fax)

## 2018-08-20 NOTE — Telephone Encounter (Signed)
Please advise pt due to visit concerns concerning her lab work result.

## 2018-08-20 NOTE — Telephone Encounter (Signed)
Dr. Caryl Comes,  Your note mentioned:  "Will want to follow the Hgb along   Seems high in an elderly woman."  Hbg- 15.5 (05/16/18).   Is there anything else I need to tell her about this?  Hgb- 14.8 (12/19/17).  Thank you!

## 2018-08-25 NOTE — Telephone Encounter (Signed)
Megan Shelton met you but was impressed at this old lady's high Hgb-- I dont know if it needs further assessment or not so would ask you to weigh in if you would please   [name] [phone]

## 2018-09-02 ENCOUNTER — Ambulatory Visit (INDEPENDENT_AMBULATORY_CARE_PROVIDER_SITE_OTHER): Payer: Medicare Other

## 2018-09-02 ENCOUNTER — Other Ambulatory Visit: Payer: Self-pay

## 2018-09-02 DIAGNOSIS — I255 Ischemic cardiomyopathy: Secondary | ICD-10-CM | POA: Diagnosis not present

## 2018-09-02 DIAGNOSIS — Z95810 Presence of automatic (implantable) cardiac defibrillator: Secondary | ICD-10-CM | POA: Diagnosis not present

## 2018-09-03 ENCOUNTER — Telehealth: Payer: Self-pay | Admitting: Internal Medicine

## 2018-09-03 NOTE — Telephone Encounter (Signed)
   Primary Cardiologist: Virl Axe, MD  Chart reviewed as part of pre-operative protocol coverage. Patient was contacted 09/03/2018 in reference to pre-operative risk assessment for pending surgery as outlined below.  Megan Shelton was last seen on 08/19/18 by Dr. Caryl Comes.  Since that day, Megan Shelton has done well.  She can complete 4.0 METS.    Therefore, based on ACC/AHA guidelines, the patient would be at acceptable risk for the planned procedure without further cardiovascular testing.   I will route this recommendation to the requesting party via Epic fax function and remove from pre-op pool.  Please call with questions.  Tami Lin Jarmaine Ehrler, PA 09/03/2018, 11:12 AM

## 2018-09-03 NOTE — Progress Notes (Signed)
EPIC Encounter for ICM Monitoring  Patient Name: Megan Shelton is a 77 y.o. female Date: 09/03/2018 Primary Care Physican: Mar Daring, PA-C Primary Cardiologist:Klein Electrophysiologist:Klein Last CBSWHQ:759 lbs(does not weigh athome)   Heart Failure questions reviewed. Pt asymptomatic.  Carpal tunnel surgery next week.  Optivol Thoracic impedancenormal.   Prescribed:Furosemide40 mg take 1 tablet by mouth as needed  Labs:  05/16/2018 Creatinine 0.74, BUN 24, Potassium 4.4, Sodium 140, GFR 79-91 12/19/2017 Creatinine 0.69, BUN 16, Potassium 4.0, Sodium 138, GFR 85-98  Recommendations: Advised to call if she experiencesfluid symptoms.  Follow-up plan: ICM clinic phone appointment on6/29/2020.   Copy of ICM check sent to [place].  3 month ICM trend: 09/02/2018    1 Year ICM trend:       Megan Billings, RN 09/03/2018 9:41 AM

## 2018-09-03 NOTE — Telephone Encounter (Signed)
   [facility] Pre-operative Risk Assessment    Request for surgical clearance:  1. What type of surgery is being performed? Left trigger finger release  2. When is this surgery scheduled? 09/12/2018  3. What type of clearance is required (medical clearance vs. Pharmacy clearance to hold med vs. Both)? Not listed  4. Are there any medications that need to be held prior to surgery and how long?no listed  5. Practice name and name of physician performing surgery? [facility]  6. What is your office phone number [phone]   7.   What is your office fax number [phone]  8.   Anesthesia type (None, local, MAC, general) ? Not listed   Lucienne Minks 09/03/2018, 10:00 AM  _________________________________________________________________   (provider comments below)

## 2018-09-05 ENCOUNTER — Telehealth: Payer: Self-pay | Admitting: Internal Medicine

## 2018-09-05 NOTE — Telephone Encounter (Signed)
LMOVM for [facility] Team Lead advising form has not been received and requested that it be re-faxed to our direct DC fax number.

## 2018-09-05 NOTE — Telephone Encounter (Signed)
[facility] calling States they faxed over a pacemaker clearance on 5/22 Checking on status  Please call Megan Shelton at [phone] with any questions or concerns Fax number is [phone]

## 2018-09-08 NOTE — Telephone Encounter (Signed)
LMOVM for [facility] team lead requesting that the pacemaker clearance be re-faxed. 2nd attempt.

## 2018-09-08 NOTE — Telephone Encounter (Signed)
3rd attempt. LMOVM.

## 2018-09-09 NOTE — Telephone Encounter (Signed)
Fax received, attempted to call Team Lead back to confirm what procedure pt will be having.

## 2018-09-09 NOTE — Telephone Encounter (Signed)
[facility] calling States they re-faxed form to DC fax number given Given DC phone number to check on status

## 2018-09-10 NOTE — Telephone Encounter (Signed)
LMOVM for Team Lead requesting call back to DC. Peri-op clearance form does not indicate what procedure pt is having.

## 2018-09-11 NOTE — Telephone Encounter (Signed)
.  SURGICAL PROCEDURE IS LEFT TRIGGER FINGER RELEASE. PATIENT SURGERY IS TOMORROW, TINA @ [facility] LEAVES AT 1PM TODAY, AND REQUESTS A CALL BACK BY THEN

## 2018-09-11 NOTE — Telephone Encounter (Signed)
Spoke with Megan Shelton. Advised that form was refaxed, confirmation received. Megan Shelton aware to call back if she needs additional information.

## 2018-09-22 ENCOUNTER — Ambulatory Visit (INDEPENDENT_AMBULATORY_CARE_PROVIDER_SITE_OTHER): Payer: Medicare Other | Admitting: *Deleted

## 2018-09-22 DIAGNOSIS — I255 Ischemic cardiomyopathy: Secondary | ICD-10-CM

## 2018-09-23 ENCOUNTER — Telehealth: Payer: Self-pay

## 2018-09-23 LAB — CUP PACEART REMOTE DEVICE CHECK
Battery Remaining Longevity: 96 mo
Battery Voltage: 3 V
Brady Statistic RV Percent Paced: 0.01 %
Date Time Interrogation Session: 20200616142114
HighPow Impedance: 91 Ohm
Implantable Lead Implant Date: 20080121
Implantable Lead Location: 753860
Implantable Lead Model: 180
Implantable Lead Serial Number: 107986
Implantable Pulse Generator Implant Date: 20160415
Lead Channel Impedance Value: 494 Ohm
Lead Channel Impedance Value: 513 Ohm
Lead Channel Pacing Threshold Amplitude: 1.125 V
Lead Channel Pacing Threshold Pulse Width: 0.4 ms
Lead Channel Sensing Intrinsic Amplitude: 21.875 mV
Lead Channel Sensing Intrinsic Amplitude: 21.875 mV
Lead Channel Setting Pacing Amplitude: 2.25 V
Lead Channel Setting Pacing Pulse Width: 0.4 ms
Lead Channel Setting Sensing Sensitivity: 0.3 mV

## 2018-09-23 NOTE — Telephone Encounter (Signed)
Left message for patient to remind of missed remote transmission.  

## 2018-09-29 ENCOUNTER — Encounter: Payer: Self-pay | Admitting: Cardiology

## 2018-09-29 NOTE — Progress Notes (Signed)
Remote ICD transmission.   

## 2018-10-06 ENCOUNTER — Telehealth: Payer: Self-pay

## 2018-10-06 ENCOUNTER — Ambulatory Visit (INDEPENDENT_AMBULATORY_CARE_PROVIDER_SITE_OTHER): Payer: Medicare Other

## 2018-10-06 DIAGNOSIS — Z95810 Presence of automatic (implantable) cardiac defibrillator: Secondary | ICD-10-CM

## 2018-10-06 DIAGNOSIS — I255 Ischemic cardiomyopathy: Secondary | ICD-10-CM

## 2018-10-06 NOTE — Telephone Encounter (Signed)
Remote ICM transmission received.  Attempted call to patient regarding ICM remote transmission and left message, per DPR, to return call.    

## 2018-10-06 NOTE — Progress Notes (Signed)
EPIC Encounter for ICM Monitoring  Patient Name: Megan Shelton is a 78 y.o. female Date: 10/06/2018 Primary Care Physican: Mar Daring, PA-C Primary Cardiologist:Klein Electrophysiologist:Klein Last AYOKHT:977 lbs(does not weigh athome)   Attempted call to patient and unable to reach.  Left message to return call. Transmission reviewed.   OptivolThoracic impedanceslightly below baseline normal.   Prescribed:Furosemide40 mg take 1 tablet by mouth as needed  Labs:  05/16/2018 Creatinine 0.74, BUN 24, Potassium 4.4, Sodium 140, GFR 79-91 12/19/2017 Creatinine 0.69, BUN 16, Potassium 4.0, Sodium 138, GFR 85-98  Recommendations:Unable to reach.    Follow-up plan: ICM clinic phone appointment on8/06/2018.   Copy of ICM check sent to [place].  3 month ICM trend: 10/06/2018    1 Year ICM trend:       Rosalene Billings, RN 10/06/2018 12:17 PM

## 2018-10-07 NOTE — Progress Notes (Signed)
Returned patient call as requested by voice mail message.  Transmission reviewed.  She did not experience any fluid symptoms during decreased impedance.  Advised to take 1 dose of PRN Furosemide since it is possible she has fluid accumulation last 2 days.  She said she has been eating too many chips and will cut back on the salt.  Advised to call if she does experience any fluid symptoms.  Next ICM remote transmission 11/10/2018.

## 2018-11-10 ENCOUNTER — Ambulatory Visit (INDEPENDENT_AMBULATORY_CARE_PROVIDER_SITE_OTHER): Payer: Medicare Other

## 2018-11-10 DIAGNOSIS — Z95810 Presence of automatic (implantable) cardiac defibrillator: Secondary | ICD-10-CM

## 2018-11-10 DIAGNOSIS — I255 Ischemic cardiomyopathy: Secondary | ICD-10-CM | POA: Diagnosis not present

## 2018-11-11 ENCOUNTER — Telehealth: Payer: Self-pay

## 2018-11-11 NOTE — Progress Notes (Signed)
EPIC Encounter for ICM Monitoring  Patient Name: Megan Shelton is a 78 y.o. female Date: 11/11/2018 Primary Care Physican: Mar Daring, PA-C       Primary Cardiologist:Klein       Electrophysiologist:Klein       Last HAWUJN:406 lbs(does not weigh athome)   Transmission reviewed.   Spoke with patient and she is doing fine.  She was on vacation during decreased impedance and did not have any fluid symptoms.  OptivolThoracic impedancenormal but was suggestive of possible fluid accumulation 7/26-7/29.  Prescribed:Furosemide40 mg take 1 tablet by mouth as needed  Labs: 05/16/2018 Creatinine 0.74, BUN 24, Potassium 4.4, Sodium 140, GFR 79-91 12/19/2017 Creatinine 0.69, BUN 16, Potassium 4.0, Sodium 138, GFR 85-98  Recommendations:No changes and encouraged to call if experiencing any fluid symptoms.      Follow-up plan: ICM clinic phone appointment on9/15/2020.   Copy of ICM check sent to [place].   3 month ICM trend: 11/10/2018    1 Year ICM trend:       Rosalene Billings, RN 11/11/2018 11:26 AM

## 2018-11-11 NOTE — Telephone Encounter (Signed)
Spoke with patient to remind of missed remote transmission 

## 2018-11-20 NOTE — Progress Notes (Signed)
Patient: Megan Shelton Female    DOB: [birth year]   78 y.o.   MRN: [id number] Visit Date: 11/21/2018  Today's Provider: Mar Daring, PA-C   Chief Complaint  Patient presents with  . Follow-up    DM and Hypertension   Subjective:    I,Joseline E. Rosas,RMA am acting as a Education administrator for Mar Daring, PA-C.  HPI  Annual Physical Exam:  Sleeping well, no complaints.    Diabetes Mellitus Type II, Follow-up:   Lab Results  Component Value Date   HGBA1C 6.1 (H) 05/16/2018   HGBA1C 6.2 (H) 12/19/2017   Last seen for diabetes 6 months ago.  Management since then includes; labs checked; no changes. She reports excellent compliance with treatment. She is not having side effects.  Current symptoms include none and have been stable. Home blood sugar records: 100's-180 when ever she checks  Episodes of hypoglycemia? yes - a month ago it was 44   Most Recent Eye Exam: 05/2018 Weight trend: stable Current diet: in general, a "healthy" diet   Current exercise: swimming  ------------------------------------------------------------------------   Hypertension, follow-up:  BP Readings from Last 3 Encounters:  11/21/18 125/75  05/16/18 113/70  12/19/17 140/78    She was last seen for hypertension 6 months ago.  BP at that visit was 113/70. Management since that visit includes; labs checked, no changes.She reports good compliance with treatment. She is not having side effects.  She is exercising. She is adherent to low salt diet.   Outside blood pressures are n/a. She is experiencing none.  Patient denies chest pain, chest pressure/discomfort, exertional chest pressure/discomfort, fatigue, lower extremity edema, near-syncope and palpitations.   Cardiovascular risk factors include advanced age (older than 49 for men, 55 for women), diabetes mellitus and hypertension.   ------------------------------------------------------------------------  H/O  thyroidectomy From 05/16/2018-labs checked, no changes.   GAD (generalized anxiety disorder) From 05/16/2018-Stable. Diagnosis pulled for medication refill. Current trearment clonazePAM (KLONOPIN) 1 mg tablet; tid prn for anxiety.    Allergies  Allergen Reactions  . Latex   . Nickel      Current Outpatient Medications:  .  atorvastatin (LIPITOR) 40 MG tablet, Take 1 tablet (40 mg total) by mouth daily., Disp: 90 tablet, Rfl: 3 .  Blood Glucose Monitoring Suppl (ONETOUCH VERIO IQ SYSTEM) w/Device KIT, To check blood sugar once daily., Disp: 1 kit, Rfl: 0 .  clonazePAM (KLONOPIN) 1 MG tablet, TAKE 1 TABLET BY MOUTH 3 TIMES A DAY AS NEEDED FOR ANXIETY, Disp: 90 tablet, Rfl: 5 .  Coenzyme Q10 (COQ10 PO), Take 1 capsule by mouth daily., Disp: , Rfl:  .  furosemide (LASIX) 40 MG tablet, TAKE 1 TABLET BY MOUTH EVERY DAY AS NEEDED, Disp: 90 tablet, Rfl: 1 .  glimepiride (AMARYL) 1 MG tablet, TAKE 1 TABLET BY MOUTH TWO  TIMES DAILY, Disp: 180 tablet, Rfl: 3 .  glucose blood test strip, To check BS once daily, Disp: 100 each, Rfl: 12 .  levothyroxine (SYNTHROID, LEVOTHROID) 112 MCG tablet, TAKE 1 TABLET BY MOUTH  DAILY BEFORE BREAKFAST, Disp: 90 tablet, Rfl: 3 .  lisinopril (PRINIVIL,ZESTRIL) 10 MG tablet, TAKE 1 TABLET BY MOUTH  DAILY, Disp: 90 tablet, Rfl: 3 .  metFORMIN (GLUCOPHAGE-XR) 500 MG 24 hr tablet, TAKE 1 TABLET BY MOUTH IN  THE MORNING AND 2 TABLETS  IN THE EVENING, Disp: 270 tablet, Rfl: 3 .  metoprolol tartrate (LOPRESSOR) 25 MG tablet, TAKE 1 TABLET BY MOUTH TWO  TIMES DAILY, Disp:  180 tablet, Rfl: 3 .  ONETOUCH DELICA LANCETS 35D MISC, CHECK BLOOD SUGAR ONCE  DAILY, Disp: 100 each, Rfl: 11 .  pyridOXINE (VITAMIN B-6) 100 MG tablet, Take 100 mg by mouth daily., Disp: , Rfl:  .  sitaGLIPtin (JANUVIA) 100 MG tablet, Take 100 mg by mouth daily., Disp: , Rfl:  .  vitamin B-12 (CYANOCOBALAMIN) 100 MCG tablet, Take 100 mcg by mouth daily., Disp: , Rfl:  .  aspirin 81 MG tablet, Take 81 mg  by mouth daily., Disp: , Rfl:   Review of Systems  Constitutional: Negative for appetite change, chills, fatigue and fever.  HENT: Negative.   Eyes: Negative.   Respiratory: Negative for chest tightness and shortness of breath.   Cardiovascular: Negative for chest pain and palpitations.  Gastrointestinal: Negative for abdominal pain, nausea and vomiting.  Endocrine: Negative.   Genitourinary: Negative.   Musculoskeletal: Negative.   Skin: Negative.   Allergic/Immunologic: Negative.   Neurological: Negative for dizziness and weakness.  Hematological: Negative.   Psychiatric/Behavioral: Negative.     Social History   Tobacco Use  . Smoking status: Former Smoker    Types: Cigarettes  . Smokeless tobacco: Never Used  . Tobacco comment: quit about 18 years ago; as of 2019  Substance Use Topics  . Alcohol use: No    Frequency: Never      Objective:   BP 125/75 (BP Location: Left Arm, Patient Position: Sitting, Cuff Size: Large)   Pulse 65   Temp (!) 97.1 F (36.2 C) (Oral)   Resp 16   Wt 165 lb (74.8 kg)   BMI 32.22 kg/m  Vitals:   11/21/18 1104  BP: 125/75  Pulse: 65  Resp: 16  Temp: (!) 97.1 F (36.2 C)  TempSrc: Oral  Weight: 165 lb (74.8 kg)     Physical Exam Vitals signs reviewed.  Constitutional:      General: She is not in acute distress.    Appearance: Normal appearance. She is well-developed. She is obese. She is not ill-appearing or diaphoretic.  HENT:     Head: Normocephalic and atraumatic.     Right Ear: Tympanic membrane, ear canal and external ear normal.     Left Ear: Tympanic membrane, ear canal and external ear normal.     Nose: Nose normal.     Mouth/Throat:     Mouth: Mucous membranes are moist.     Pharynx: No oropharyngeal exudate.  Eyes:     General: No scleral icterus.       Right eye: No discharge.        Left eye: No discharge.     Extraocular Movements: Extraocular movements intact.     Conjunctiva/sclera: Conjunctivae normal.      Pupils: Pupils are equal, round, and reactive to light.  Neck:     Musculoskeletal: Normal range of motion and neck supple.     Thyroid: No thyromegaly.     Vascular: No carotid bruit or JVD.     Trachea: No tracheal deviation.  Cardiovascular:     Rate and Rhythm: Normal rate and regular rhythm.     Pulses: Normal pulses.     Heart sounds: Normal heart sounds. No murmur. No friction rub. No gallop.   Pulmonary:     Effort: Pulmonary effort is normal. No respiratory distress.     Breath sounds: Normal breath sounds. No wheezing or rales.  Chest:     Chest wall: No tenderness.    Abdominal:  General: Abdomen is protuberant. Bowel sounds are normal. There is no distension.     Palpations: Abdomen is soft. There is no mass.     Tenderness: There is no abdominal tenderness. There is no guarding or rebound.  Musculoskeletal: Normal range of motion.        General: No tenderness.     Right lower leg: No edema.     Left lower leg: No edema.  Lymphadenopathy:     Cervical: No cervical adenopathy.  Skin:    General: Skin is warm and dry.     Capillary Refill: Capillary refill takes less than 2 seconds.     Findings: No rash.  Neurological:     General: No focal deficit present.     Mental Status: She is alert and oriented to person, place, and time. Mental status is at baseline.     Cranial Nerves: No cranial nerve deficit.     Motor: No weakness.     Coordination: Coordination normal.     Gait: Gait normal.  Psychiatric:        Mood and Affect: Mood normal.        Behavior: Behavior normal.        Thought Content: Thought content normal.        Judgment: Judgment normal.    Diabetic Foot Exam - Simple   Simple Foot Form Diabetic Foot exam was performed with the following findings: Yes 11/21/2018  1:23 PM  Visual Inspection No deformities, no ulcerations, no other skin breakdown bilaterally: Yes Sensation Testing See comments: Yes Pulse Check Posterior Tibialis and  Dorsalis pulse intact bilaterally: Yes Comments Decreased sensation bilaterally on toes      No results found for any visits on 11/21/18.     Assessment & Plan    1. Annual physical exam Normal physical exam today. Will check labs as below and f/u pending lab results. If labs are stable and WNL she will not need to have these rechecked for one year at her next annual physical exam. She is to call the office in the meantime if she has any acute issue, questions or concerns.  2. Type 2 diabetes mellitus with diabetic neuropathy, without long-term current use of insulin (HCC) Stable. Continue Metformin XR 583m, Januvia 1027m glimperide 22m48mWill check labs as below and f/u pending results. - CBC with Differential/Platelet - Comprehensive metabolic panel - Lipid panel - Hemoglobin A1c  3. Essential hypertension Stable. Continue Metoprolol 31m44mD, lisinopril 10mg52mll check labs as below and f/u pending results. - CBC with Differential/Platelet - Comprehensive metabolic panel - Lipid panel - Hemoglobin A1c - TSH  4. H/O thyroidectomy Stable. Continue levothyroxine 112mcg73mll check labs as below and f/u pending results. - CBC with Differential/Platelet - TSH  5. GAD (generalized anxiety disorder) Stable. Diagnosis pulled for medication refill. Continue current medical treatment plan. - CBC with Differential/Platelet - Comprehensive metabolic panel - Lipid panel - Hemoglobin A1c - TSH - clonazePAM (KLONOPIN) 1 MG tablet; TAKE 1 TABLET BY MOUTH 3 TIMES A DAY AS NEEDED FOR ANXIETY  Dispense: 90 tablet; Refill: 5  6. Congestive heart failure, unspecified HF chronicity, unspecified heart failure type (HCC) [place]. Followed by Cardiology.      JennifMar Daring  [facility]

## 2018-11-21 ENCOUNTER — Other Ambulatory Visit: Payer: Self-pay

## 2018-11-21 ENCOUNTER — Encounter: Payer: Self-pay | Admitting: Physician Assistant

## 2018-11-21 ENCOUNTER — Ambulatory Visit (INDEPENDENT_AMBULATORY_CARE_PROVIDER_SITE_OTHER): Payer: Medicare Other | Admitting: Physician Assistant

## 2018-11-21 VITALS — BP 125/75 | HR 65 | Temp 97.1°F | Resp 16 | Wt 165.0 lb

## 2018-11-21 DIAGNOSIS — Z0000 Encounter for general adult medical examination without abnormal findings: Secondary | ICD-10-CM

## 2018-11-21 DIAGNOSIS — E1140 Type 2 diabetes mellitus with diabetic neuropathy, unspecified: Secondary | ICD-10-CM | POA: Diagnosis not present

## 2018-11-21 DIAGNOSIS — I10 Essential (primary) hypertension: Secondary | ICD-10-CM | POA: Diagnosis not present

## 2018-11-21 DIAGNOSIS — I509 Heart failure, unspecified: Secondary | ICD-10-CM

## 2018-11-21 DIAGNOSIS — E890 Postprocedural hypothyroidism: Secondary | ICD-10-CM

## 2018-11-21 DIAGNOSIS — F411 Generalized anxiety disorder: Secondary | ICD-10-CM

## 2018-11-21 DIAGNOSIS — Z9009 Acquired absence of other part of head and neck: Secondary | ICD-10-CM | POA: Diagnosis not present

## 2018-11-21 MED ORDER — CLONAZEPAM 1 MG PO TABS: ORAL_TABLET | ORAL | 5 refills | Status: DC

## 2018-11-21 NOTE — Patient Instructions (Signed)
Health Maintenance After Age 77 After age 77, you are at a higher risk for certain long-term diseases and infections as well as injuries from falls. Falls are a major cause of broken bones and head injuries in people who are older than age 77. Getting regular preventive care can help to keep you healthy and well. Preventive care includes getting regular testing and making lifestyle changes as recommended by your health care provider. Talk with your health care provider about:  Which screenings and tests you should have. A screening is a test that checks for a disease when you have no symptoms.  A diet and exercise plan that is right for you. What should I know about screenings and tests to prevent falls? Screening and testing are the best ways to find a health problem early. Early diagnosis and treatment give you the best chance of managing medical conditions that are common after age 77. Certain conditions and lifestyle choices may make you more likely to have a fall. Your health care provider may recommend:  Regular vision checks. Poor vision and conditions such as cataracts can make you more likely to have a fall. If you wear glasses, make sure to get your prescription updated if your vision changes.  Medicine review. Work with your health care provider to regularly review all of the medicines you are taking, including over-the-counter medicines. Ask your health care provider about any side effects that may make you more likely to have a fall. Tell your health care provider if any medicines that you take make you feel dizzy or sleepy.  Osteoporosis screening. Osteoporosis is a condition that causes the bones to get weaker. This can make the bones weak and cause them to break more easily.  Blood pressure screening. Blood pressure changes and medicines to control blood pressure can make you feel dizzy.  Strength and balance checks. Your health care provider may recommend certain tests to check your  strength and balance while standing, walking, or changing positions.  Foot health exam. Foot pain and numbness, as well as not wearing proper footwear, can make you more likely to have a fall.  Depression screening. You may be more likely to have a fall if you have a fear of falling, feel emotionally low, or feel unable to do activities that you used to do.  Alcohol use screening. Using too much alcohol can affect your balance and may make you more likely to have a fall. What actions can I take to lower my risk of falls? General instructions  Talk with your health care provider about your risks for falling. Tell your health care provider if: ? You fall. Be sure to tell your health care provider about all falls, even ones that seem minor. ? You feel dizzy, sleepy, or off-balance.  Take over-the-counter and prescription medicines only as told by your health care provider. These include any supplements.  Eat a healthy diet and maintain a healthy weight. A healthy diet includes low-fat dairy products, low-fat (lean) meats, and fiber from whole grains, beans, and lots of fruits and vegetables. Home safety  Remove any tripping hazards, such as rugs, cords, and clutter.  Install safety equipment such as grab bars in bathrooms and safety rails on stairs.  Keep rooms and walkways well-lit. Activity   Follow a regular exercise program to stay fit. This will help you maintain your balance. Ask your health care provider what types of exercise are appropriate for you.  If you need a cane or   walker, use it as recommended by your health care provider.  Wear supportive shoes that have nonskid soles. Lifestyle  Do not drink alcohol if your health care provider tells you not to drink.  If you drink alcohol, limit how much you have: ? 0-1 drink a day for women. ? 0-2 drinks a day for men.  Be aware of how much alcohol is in your drink. In the [place] one drink equals one typical bottle of beer (12  oz), one-half glass of wine (5 oz), or one shot of hard liquor (1 oz).  Do not use any products that contain nicotine or tobacco, such as cigarettes and e-cigarettes. If you need help quitting, ask your health care provider. Summary  Having a healthy lifestyle and getting preventive care can help to protect your health and wellness after age 77.  Screening and testing are the best way to find a health problem early and help you avoid having a fall. Early diagnosis and treatment give you the best chance for managing medical conditions that are more common for people who are older than age 77.  Falls are a major cause of broken bones and head injuries in people who are older than age 77. Take precautions to prevent a fall at home.  Work with your health care provider to learn what changes you can make to improve your health and wellness and to prevent falls. This information is not intended to replace advice given to you by your health care provider. Make sure you discuss any questions you have with your health care provider. Document Released: 02/06/2017 Document Revised: 07/17/2018 Document Reviewed: 02/06/2017 Elsevier Patient Education  2020 [employer]  

## 2018-11-22 LAB — COMPREHENSIVE METABOLIC PANEL
ALT: 18 IU/L (ref 0–32)
AST: 19 IU/L (ref 0–40)
Albumin/Globulin Ratio: 2.1 (ref 1.2–2.2)
Albumin: 4.8 g/dL — ABNORMAL HIGH (ref 3.7–4.7)
Alkaline Phosphatase: 96 IU/L (ref 39–117)
BUN/Creatinine Ratio: 16 (ref 12–28)
BUN: 12 mg/dL (ref 8–27)
Bilirubin Total: 0.4 mg/dL (ref 0.0–1.2)
CO2: 26 mmol/L (ref 20–29)
Calcium: 9.6 mg/dL (ref 8.7–10.3)
Chloride: 96 mmol/L (ref 96–106)
Creatinine, Ser: 0.74 mg/dL (ref 0.57–1.00)
GFR calc Af Amer: 91 mL/min/{1.73_m2} (ref >59)
GFR calc non Af Amer: 79 mL/min/{1.73_m2} (ref >59)
Globulin, Total: 2.3 g/dL (ref 1.5–4.5)
Glucose: 78 mg/dL (ref 65–99)
Potassium: 4.3 mmol/L (ref 3.5–5.2)
Sodium: 140 mmol/L (ref 134–144)
Total Protein: 7.1 g/dL (ref 6.0–8.5)

## 2018-11-22 LAB — CBC WITH DIFFERENTIAL/PLATELET
Basophils Absolute: 0 10*3/uL (ref 0.0–0.2)
Basos: 0 %
EOS (ABSOLUTE): 0.3 10*3/uL (ref 0.0–0.4)
Eos: 3 %
Hematocrit: 46.2 % (ref 34.0–46.6)
Hemoglobin: 15.3 g/dL (ref 11.1–15.9)
Immature Grans (Abs): 0 10*3/uL (ref 0.0–0.1)
Immature Granulocytes: 0 %
Lymphocytes Absolute: 2.2 10*3/uL (ref 0.7–3.1)
Lymphs: 25 %
MCH: 28.5 pg (ref 26.6–33.0)
MCHC: 33.1 g/dL (ref 31.5–35.7)
MCV: 86 fL (ref 79–97)
Monocytes Absolute: 0.7 10*3/uL (ref 0.1–0.9)
Monocytes: 7 %
Neutrophils Absolute: 5.7 10*3/uL (ref 1.4–7.0)
Neutrophils: 65 %
Platelets: 245 10*3/uL (ref 150–450)
RBC: 5.37 x10E6/uL — ABNORMAL HIGH (ref 3.77–5.28)
RDW: 14.2 % (ref 11.7–15.4)
WBC: 8.9 10*3/uL (ref 3.4–10.8)

## 2018-11-22 LAB — TSH: TSH: 1.24 u[IU]/mL (ref 0.450–4.500)

## 2018-11-22 LAB — LIPID PANEL
Chol/HDL Ratio: 2.8 ratio (ref 0.0–4.4)
Cholesterol, Total: 156 mg/dL (ref 100–199)
HDL: 55 mg/dL (ref >39)
LDL Calculated: 69 mg/dL (ref 0–99)
Triglycerides: 158 mg/dL — ABNORMAL HIGH (ref 0–149)
VLDL Cholesterol Cal: 32 mg/dL (ref 5–40)

## 2018-11-22 LAB — HEMOGLOBIN A1C
Est. average glucose Bld gHb Est-mCnc: 137 mg/dL
Hgb A1c MFr Bld: 6.4 % — ABNORMAL HIGH (ref 4.8–5.6)

## 2018-11-24 ENCOUNTER — Telehealth: Payer: Self-pay

## 2018-11-24 DIAGNOSIS — E1140 Type 2 diabetes mellitus with diabetic neuropathy, unspecified: Secondary | ICD-10-CM

## 2018-11-24 MED ORDER — ACCU-CHEK SOFT TOUCH LANCETS MISC: 12 refills | Status: DC

## 2018-11-24 MED ORDER — ACCU-CHEK COMPACT PLUS VI STRP: ORAL_STRIP | 12 refills | Status: DC

## 2018-11-24 MED ORDER — ACCU-CHEK COMPACT PLUS CARE KIT: PACK | 0 refills | Status: DC

## 2018-11-24 NOTE — Telephone Encounter (Signed)
Sent to [facility]

## 2018-11-24 NOTE — Telephone Encounter (Signed)
Patient advised as directed below. Patient is requesting a new prescription for Accu Check Contact the whole kit. She has One touch vero but states it doesn't work or read well.

## 2018-11-24 NOTE — Telephone Encounter (Signed)
No answer/call cannot be completed at this time

## 2018-11-24 NOTE — Telephone Encounter (Signed)
-----   Message from Mar Daring, [place] sent at 11/24/2018  8:32 AM EDT ----- All labs are normal and stable with exception of A1c that increased from 6.1 to 6.4. Continue to work on healthy lifestyle modifications and limit sugars and simple carbohydrates from diet. Continue diabetic medications. Still under goal at keeping A1c less than 7.0.

## 2018-12-17 NOTE — Progress Notes (Addendum)
Subjective:   Megan Shelton is a 78 y.o. female who presents for Medicare Annual (Subsequent) preventive examination.    This visit is being conducted through telemedicine due to the COVID-19 pandemic. This patient has given me verbal consent via doximity to conduct this visit, patient states they are participating from their home address. Some vital signs may be absent or patient reported.    Patient identification: identified by name, DOB, and current address  Review of Systems:  N/A  Cardiac Risk Factors include: advanced age (>39mn, >>49women);dyslipidemia;hypertension;diabetes mellitus     Objective:     Vitals: There were no vitals taken for this visit.  There is no height or weight on file to calculate BMI. Unable to obtain vitals due to visit being conducted via telephonically.   Advanced Directives 12/18/2018 12/13/2017 12/13/2017  Does Patient Have a Medical Advance Directive? Yes No Yes  Type of AParamed[place]of [place]ort [place] Does patient want to make changes to medical advance directive? - Yes (MAU/Ambulatory/Procedural Areas - Information given) -  Copy of HPutnamin Chart? No - copy requested - No - copy requested    Tobacco Social History   Tobacco Use  Smoking Status Former Smoker  . Types: Cigarettes  Smokeless Tobacco Never Used  Tobacco Comment   quit about 18 years ago; as of 2019     Counseling given: Not Answered Comment: quit about 18 years ago; as of 2019   Clinical Intake:  Pre-visit preparation completed: Yes  Pain : No/denies pain Pain Score: 0-No pain     Nutritional Risks: None Diabetes: Yes  How often do you need to have someone help you when you read instructions, pamphlets, or other written materials from your doctor or pharmacy?: 1 - Never   Diabetes:  Is the patient diabetic?  Yes type 2 If diabetic, was a CBG obtained today?  No  Did the  patient bring in their glucometer from home?  No  How often do you monitor your CBG's? A couple times a week.   Financial Strains and Diabetes Management:  Are you having any financial strains with the device, your supplies or your medication? No .  Does the patient want to be seen by Chronic Care Management for management of their diabetes?  No  Would the patient like to be referred to a Nutritionist or for Diabetic Management?  No   Diabetic Exams:  Diabetic Eye Exam: Completed 04/22/18. Repeat yearly.  Diabetic Foot Exam: Completed 11/21/18. Repeat yearly.    Interpreter Needed?: No  Information [facility] :: [facility]  Past Medical History:  Diagnosis Date  . Diabetes mellitus with neuropathy (HSan Patricio   . Hypertensive heart disease   . Implantable cardioverter-defibrillator (ICD)-[employer]   . Myocardial infarction, anterior wall ([place]   . Obesity, Class II, BMI 35-39.9    Past Surgical History:  Procedure Laterality Date  . CARPAL TUNNEL RELEASE    . EYE SURGERY     Family History  Problem Relation Age of Onset  . Rashes / Skin problems Mother   . Cancer Father 41      colon cancer  . Heart disease Brother        died of heart attack  . Heart disease Brother    Social History   Socioeconomic History  . Marital status: Widowed    Spouse name: Not on file  . Number of children: 3  .  Years of education: Not on file  . Highest education level: High school graduate  Occupational History  . Occupation: retired  Scientific laboratory technician  . Financial resource strain: Not hard at all  . Food insecurity    Worry: Never true    Inability: Never true  . Transportation needs    Medical: No    Non-medical: No  Tobacco Use  . Smoking status: Former Smoker    Types: Cigarettes  . Smokeless tobacco: Never Used  . Tobacco comment: quit about 18 years ago; as of 2019  Substance and Sexual Activity  . Alcohol use: No    Frequency: Never  . Drug use: No  . Sexual activity:  Not on file  Lifestyle  . Physical activity    Days per week: 0 days    Minutes per session: 0 min  . Stress: Not at all  Relationships  . Social Herbalist on phone: Patient refused    Gets together: Patient refused    Attends religious service: Patient refused    Active member of club or organization: Patient refused    Attends meetings of clubs or organizations: Patient refused    Relationship status: Patient refused  Other Topics Concern  . Not on file  Social History Narrative  . Not on file    Outpatient Encounter Medications as of 12/18/2018  Medication Sig  . aspirin 81 MG tablet Take 81 mg by mouth daily.  Marland Kitchen atorvastatin (LIPITOR) 40 MG tablet Take 1 tablet (40 mg total) by mouth daily.  . Blood Glucose Monitoring Suppl (ACCU-CHEK COMPACT CARE KIT) KIT To check blood sugar daily.  . clonazePAM (KLONOPIN) 1 MG tablet TAKE 1 TABLET BY MOUTH 3 TIMES A DAY AS NEEDED FOR ANXIETY  . Coenzyme Q10 (COQ10 PO) Take 1 capsule by mouth daily.  . furosemide (LASIX) 40 MG tablet TAKE 1 TABLET BY MOUTH EVERY DAY AS NEEDED  . glimepiride (AMARYL) 1 MG tablet TAKE 1 TABLET BY MOUTH TWO  TIMES DAILY  . glucose blood (ACCU-CHEK COMPACT PLUS) test strip To check blood sugar daily.  . Lancets (ACCU-CHEK SOFT TOUCH) lancets To check blood sugar daily.  Marland Kitchen levothyroxine (SYNTHROID, LEVOTHROID) 112 MCG tablet TAKE 1 TABLET BY MOUTH  DAILY BEFORE BREAKFAST  . lisinopril (PRINIVIL,ZESTRIL) 10 MG tablet TAKE 1 TABLET BY MOUTH  DAILY  . metFORMIN (GLUCOPHAGE-XR) 500 MG 24 hr tablet TAKE 1 TABLET BY MOUTH IN  THE MORNING AND 2 TABLETS  IN THE EVENING (Patient taking differently: Take 500 mg by mouth 2 (two) times daily. )  . metoprolol tartrate (LOPRESSOR) 25 MG tablet TAKE 1 TABLET BY MOUTH TWO  TIMES DAILY  . ONETOUCH DELICA LANCETS 01U MISC CHECK BLOOD SUGAR ONCE  DAILY  . pyridOXINE (VITAMIN B-6) 100 MG tablet Take 100 mg by mouth daily.  . sitaGLIPtin (JANUVIA) 100 MG tablet Take 100 mg  by mouth daily.  . vitamin B-12 (CYANOCOBALAMIN) 100 MCG tablet Take 100 mcg by mouth daily.   No facility-administered encounter medications on file as of 12/18/2018.     Activities of Daily Living In your present state of health, do you have any difficulty performing the following activities: 12/18/2018  Hearing? N  Vision? N  Comment Wears eye glasses.  Difficulty concentrating or making decisions? N  Walking or climbing stairs? N  Dressing or bathing? N  Doing errands, shopping? N  Preparing Food and eating ? N  Using the Toilet? N  In the past six  months, have you accidently leaked urine? N  Do you have problems with loss of bowel control? N  Managing your Medications? N  Managing your Finances? N  Housekeeping or managing your Housekeeping? N  Some recent data might be hidden    Patient Care Team: Rubye Beach as PCP - General (Family Medicine) Deboraha Sprang, MD as PCP - Cardiology (Cardiology)    Assessment:   This is a routine wellness examination for Babbie.  Exercise Activities and Dietary recommendations Current Exercise Habits: Structured exercise class, Type of exercise: Other - see comments(water aerobics), Time (Minutes): 60, Frequency (Times/Week): 3, Weekly Exercise (Minutes/Week): 180, Intensity: Mild, Exercise limited by: orthopedic condition(s)  Goals    . DIET - INCREASE WATER INTAKE     Recommend increasing water intake to 4-6 glasses a day.    . Prevent falls     Recommend to remove any items from the home that may cause slips or trips.       Fall Risk: Fall Risk  12/18/2018 12/13/2017 05/03/2017  Falls in the past year? 1 No No  Number falls in past yr: 0 - -  Injury with Fall? 0 - -  Follow up Falls prevention discussed - -    FALL RISK PREVENTION PERTAINING TO THE HOME:  Any stairs in or around the home? Yes  If so, are there any without handrails? Yes  Home free of loose throw rugs in walkways, pet beds, electrical  cords, etc? Yes  Adequate lighting in your home to reduce risk of falls? Yes   ASSISTIVE DEVICES UTILIZED TO PREVENT FALLS:  Life alert? No  Use of a cane, walker or w/c? No  Grab bars in the bathroom? No  Shower chair or bench in shower? No  Elevated toilet seat or a handicapped toilet? No   TIMED UP AND GO:  Was the test performed? No .    Depression Screen PHQ 2/9 Scores 12/18/2018 12/13/2017 05/03/2017  PHQ - 2 Score 0 0 0  PHQ- 9 Score - - 6     Cognitive Function     6CIT Screen 12/18/2018 12/13/2017  What Year? 0 points 0 points  What month? 0 points 0 points  What time? 0 points 0 points  Count back from 20 0 points 0 points  Months in reverse 0 points 0 points  Repeat phrase 0 points 2 points  Total Score 0 2    Immunization History  Administered Date(s) Administered  . Influenza, High Dose Seasonal PF 12/13/2017    Qualifies for Shingles Vaccine? Yes . Due for Shingrix. Education has been provided regarding the importance of this vaccine. Pt has been advised to call insurance company to determine out of pocket expense. Advised may also receive vaccine at local pharmacy or Health Dept. Verbalized acceptance and understanding.  Tdap: Although this vaccine is not a covered service during a Wellness Exam, does the patient still wish to receive this vaccine today?  No .   Flu Vaccine: Due for Flu vaccine. Does the patient want to receive this vaccine today?  No .   Pneumococcal Vaccine: Due for Pneumococcal vaccine. Does the patient want to receive this vaccine today?  No .   Screening Tests Health Maintenance  Topic Date Due  . INFLUENZA VACCINE  11/08/2018  . DEXA SCAN  11/21/2019 (Originally 12/11/2006)  . TETANUS/TDAP  11/21/2019 (Originally 12/10/1959)  . PNA vac Low Risk Adult (1 of 2 - PCV13) 11/21/2019 (Originally 12/11/2006)  .  OPHTHALMOLOGY EXAM  04/23/2019  . HEMOGLOBIN A1C  05/24/2019  . FOOT EXAM  11/21/2019    Cancer Screenings:  Colorectal  Screening: No longer required.   Mammogram: No longer required.   Bone Density: Unsure when last DEXA was. Pt thinks it was normal. Requested records. Declines a referral at this time.   Lung Cancer Screening: (Low Dose CT Chest recommended if Age 47-80 years, 30 pack-year currently smoking OR have quit w/in 15years.) does not qualify.   Additional Screening:  Dental Screening: Recommended annual dental exams for proper oral hygiene   Community Resource Referral:  CRR required this visit?  No       Plan:  I have personally reviewed and addressed the Medicare Annual Wellness questionnaire and have noted the following in the patient's chart:  A. Medical and social history B. Use of alcohol, tobacco or illicit drugs  C. Current medications and supplements D. Functional ability and status E.  Nutritional status F.  Physical activity G. Advance directives H. List of other physicians I.  Hospitalizations, surgeries, and ER visits in previous 12 months J.  [place] such as hearing and vision if needed, cognitive and depression L. Referrals and appointments   In addition, I have reviewed and discussed with patient certain preventive protocols, quality metrics, and best practice recommendations. A written personalized care plan for preventive services as well as general preventive health recommendations were provided to patient.   [place] Score, [place]  [id number] Nurse Health Advisor   Nurse Notes: Pt declined a DEXA referral, future order for a tetanus vaccine and pneumonia vaccine, today. Pt to receive influenza vaccine at next in office visit.

## 2018-12-18 ENCOUNTER — Other Ambulatory Visit: Payer: Self-pay

## 2018-12-18 ENCOUNTER — Ambulatory Visit (INDEPENDENT_AMBULATORY_CARE_PROVIDER_SITE_OTHER): Payer: Medicare Other

## 2018-12-18 DIAGNOSIS — Z0000 Encounter for general adult medical examination without abnormal findings: Secondary | ICD-10-CM

## 2018-12-18 NOTE — Patient Instructions (Signed)
Megan Shelton , Thank you for taking time to come for your Medicare Wellness Visit. I appreciate your ongoing commitment to your health goals. Please review the following plan we discussed and let me know if I can assist you in the future.   Screening recommendations/referrals: Colonoscopy: No longer required.  Mammogram: No longer required.  Bone Density: Pt declines order today.  Recommended yearly ophthalmology/optometry visit for glaucoma screening and checkup Recommended yearly dental visit for hygiene and checkup  Vaccinations: Influenza vaccine: Currently due Pneumococcal vaccine: Pt declines today.  Tdap vaccine: Pt declines today.  Shingles vaccine: Pt declines today.     Advanced directives: Please bring a copy of your POA (Power of Attorney) and/or Living Will to your next appointment.   Conditions/risks identified: Fall risk prevention. Recommend to drink at least 6-8 8oz glasses of water per day.  Next appointment: None. Declined scheduling a CPE or AWV for next year.    Preventive Care 77 Years and Older, Female Preventive care refers to lifestyle choices and visits with your health care provider that can promote health and wellness. What does preventive care include?  A yearly physical exam. This is also called an annual well check.  Dental exams once or twice a year.  Routine eye exams. Ask your health care provider how often you should have your eyes checked.  Personal lifestyle choices, including:  Daily care of your teeth and gums.  Regular physical activity.  Eating a healthy diet.  Avoiding tobacco and drug use.  Limiting alcohol use.  Practicing safe sex.  Taking low-dose aspirin every day.  Taking vitamin and mineral supplements as recommended by your health care provider. What happens during an annual well check? The services and screenings done by your health care provider during your annual well check will depend on your age, overall health,  lifestyle risk factors, and family history of disease. Counseling  Your health care provider may ask you questions about your:  Alcohol use.  Tobacco use.  Drug use.  Emotional well-being.  Home and relationship well-being.  Sexual activity.  Eating habits.  History of falls.  Memory and ability to understand (cognition).  Work and work Statistician.  Reproductive health. Screening  You may have the following tests or measurements:  Height, weight, and BMI.  Blood pressure.  Lipid and cholesterol levels. These may be checked every 5 years, or more frequently if you are over 25 years old.  Skin check.  Lung cancer screening. You may have this screening every year starting at age 3 if you have a 30-pack-year history of smoking and currently smoke or have quit within the past 15 years.  Fecal occult blood test (FOBT) of the stool. You may have this test every year starting at age 66.  Flexible sigmoidoscopy or colonoscopy. You may have a sigmoidoscopy every 5 years or a colonoscopy every 10 years starting at age 51.  Hepatitis C blood test.  Hepatitis B blood test.  Sexually transmitted disease (STD) testing.  Diabetes screening. This is done by checking your blood sugar (glucose) after you have not eaten for a while (fasting). You may have this done every 1-3 years.  Bone density scan. This is done to screen for osteoporosis. You may have this done starting at age 29.  Mammogram. This may be done every 1-2 years. Talk to your health care provider about how often you should have regular mammograms. Talk with your health care provider about your test results, treatment options, and if  necessary, the need for more tests. Vaccines  Your health care provider may recommend certain vaccines, such as:  Influenza vaccine. This is recommended every year.  Tetanus, diphtheria, and acellular pertussis (Tdap, Td) vaccine. You may need a Td booster every 10 years.  Zoster  vaccine. You may need this after age 9.  Pneumococcal 13-valent conjugate (PCV13) vaccine. One dose is recommended after age 48.  Pneumococcal polysaccharide (PPSV23) vaccine. One dose is recommended after age 80. Talk to your health care provider about which screenings and vaccines you need and how often you need them. This information is not intended to replace advice given to you by your health care provider. Make sure you discuss any questions you have with your health care provider. Document Released: 04/22/2015 Document Revised: 12/14/2015 Document Reviewed: 01/25/2015 Elsevier Interactive Patient Education  2017 [place] Prevention in the Home Falls can cause injuries. They can happen to people of all ages. There are many things you can do to make your home safe and to help prevent falls. What can I do on the outside of my home?  Regularly fix the edges of walkways and driveways and fix any cracks.  Remove anything that might make you trip as you walk through a door, such as a raised step or threshold.  [place] any bushes or trees on the path to your home.  Use bright outdoor lighting.  Clear any walking paths of anything that might make someone trip, such as rocks or tools.  Regularly check to see if handrails are loose or broken. Make sure that both sides of any steps have handrails.  Any raised decks and porches should have guardrails on the edges.  Have any leaves, snow, or ice cleared regularly.  Use sand or salt on walking paths during winter.  Clean up any spills in your garage right away. This includes oil or grease spills. What can I do in the bathroom?  Use night lights.  Install grab bars by the toilet and in the tub and shower. Do not use towel bars as grab bars.  Use non-skid mats or decals in the tub or shower.  If you need to sit down in the shower, use a plastic, non-slip stool.  Keep the floor dry. Clean up any water that spills on the  floor as soon as it happens.  Remove soap buildup in the tub or shower regularly.  Attach bath mats securely with double-sided non-slip rug tape.  Do not have throw rugs and other things on the floor that can make you trip. What can I do in the bedroom?  Use night lights.  Make sure that you have a light by your bed that is easy to reach.  Do not use any sheets or blankets that are too big for your bed. They should not hang down onto the floor.  Have a firm chair that has side arms. You can use this for support while you get dressed.  Do not have throw rugs and other things on the floor that can make you trip. What can I do in the kitchen?  Clean up any spills right away.  Avoid walking on wet floors.  Keep items that you use a lot in easy-to-reach places.  If you need to reach something above you, use a strong step stool that has a grab bar.  Keep electrical cords out of the way.  Do not use floor polish or wax that makes floors slippery. If you must  use wax, use non-skid floor wax.  Do not have throw rugs and other things on the floor that can make you trip. What can I do with my stairs?  Do not leave any items on the stairs.  Make sure that there are handrails on both sides of the stairs and use them. Fix handrails that are broken or loose. Make sure that handrails are as long as the stairways.  Check any carpeting to make sure that it is firmly attached to the stairs. Fix any carpet that is loose or worn.  Avoid having throw rugs at the top or bottom of the stairs. If you do have throw rugs, attach them to the floor with carpet tape.  Make sure that you have a light switch at the top of the stairs and the bottom of the stairs. If you do not have them, ask someone to add them for you. What else can I do to help prevent falls?  Wear shoes that:  Do not have high heels.  Have rubber bottoms.  Are comfortable and fit you well.  Are closed at the toe. Do not wear  sandals.  If you use a stepladder:  Make sure that it is fully opened. Do not climb a closed stepladder.  Make sure that both sides of the stepladder are locked into place.  Ask someone to hold it for you, if possible.  Clearly mark and make sure that you can see:  Any grab bars or handrails.  First and last steps.  Where the edge of each step is.  Use tools that help you move around (mobility aids) if they are needed. These include:  Canes.  Walkers.  Scooters.  Crutches.  Turn on the lights when you go into a dark area. Replace any light bulbs as soon as they burn out.  Set up your furniture so you have a clear path. Avoid moving your furniture around.  If any of your floors are uneven, fix them.  If there are any pets around you, be aware of where they are.  Review your medicines with your doctor. Some medicines can make you feel dizzy. This can increase your chance of falling. Ask your doctor what other things that you can do to help prevent falls. This information is not intended to replace advice given to you by your health care provider. Make sure you discuss any questions you have with your health care provider. Document Released: 01/20/2009 Document Revised: 09/01/2015 Document Reviewed: 04/30/2014 Elsevier Interactive Patient Education  2017 [employer].

## 2018-12-22 ENCOUNTER — Ambulatory Visit (INDEPENDENT_AMBULATORY_CARE_PROVIDER_SITE_OTHER): Payer: Medicare Other | Admitting: *Deleted

## 2018-12-22 DIAGNOSIS — I509 Heart failure, unspecified: Secondary | ICD-10-CM | POA: Diagnosis not present

## 2018-12-22 LAB — CUP PACEART REMOTE DEVICE CHECK
Battery Remaining Longevity: 89 mo
Battery Voltage: 3 V
Brady Statistic RV Percent Paced: 0.01 %
Date Time Interrogation Session: 20200914083623
HighPow Impedance: 86 Ohm
Implantable Lead Implant Date: 20080121
Implantable Lead Location: 753860
Implantable Lead Model: 180
Implantable Lead Serial Number: 107986
Implantable Pulse Generator Implant Date: 20160415
Lead Channel Impedance Value: 456 Ohm
Lead Channel Impedance Value: 494 Ohm
Lead Channel Pacing Threshold Amplitude: 1.125 V
Lead Channel Pacing Threshold Pulse Width: 0.4 ms
Lead Channel Sensing Intrinsic Amplitude: 21.125 mV
Lead Channel Sensing Intrinsic Amplitude: 21.125 mV
Lead Channel Setting Pacing Amplitude: 2.25 V
Lead Channel Setting Pacing Pulse Width: 0.4 ms
Lead Channel Setting Sensing Sensitivity: 0.3 mV

## 2018-12-23 ENCOUNTER — Ambulatory Visit (INDEPENDENT_AMBULATORY_CARE_PROVIDER_SITE_OTHER): Payer: Medicare Other

## 2018-12-23 ENCOUNTER — Telehealth: Payer: Self-pay

## 2018-12-23 DIAGNOSIS — Z95810 Presence of automatic (implantable) cardiac defibrillator: Secondary | ICD-10-CM

## 2018-12-23 DIAGNOSIS — I255 Ischemic cardiomyopathy: Secondary | ICD-10-CM

## 2018-12-23 NOTE — Telephone Encounter (Signed)
Remote ICM transmission received.  Attempted call to patient regarding ICM remote transmission and no answer.  

## 2018-12-23 NOTE — Progress Notes (Signed)
EPIC Encounter for ICM Monitoring  Patient Name: Megan Shelton is a 78 y.o. female Date: 12/23/2018 Primary Care Physican: Mar Daring, PA-C Primary Cardiologist:Klein Electrophysiologist:Klein Last BZMCEY:223 lbs(does not weigh athome)   Attempted call to patient and unable to reach.   Transmission reviewed.   OptivolThoracic impedancenormal.  Prescribed:Furosemide40 mg take 1 tablet by mouth as needed  Labs: 11/21/2018 Creatinine 0.74, BUN 12, Potassium 4.3, Sodiums 140, GFR 79-91 05/16/2018 Creatinine 0.74, BUN 24, Potassium 4.4, Sodium 140, GFR 79-91  Recommendations:Unable to reach.     Follow-up plan: ICM clinic phone appointment on 02/02/2019.   91 day device clinic remote transmission 03/23/2019.      Copy of ICM check sent to Dr. Caryl Comes.   3 month ICM trend: 12/22/2018    1 Year ICM trend:       Rosalene Billings, RN 12/23/2018 1:07 PM

## 2019-01-02 NOTE — Progress Notes (Signed)
Remote ICD transmission.   

## 2019-01-12 ENCOUNTER — Other Ambulatory Visit: Payer: Self-pay | Admitting: Physician Assistant

## 2019-01-12 DIAGNOSIS — E039 Hypothyroidism, unspecified: Secondary | ICD-10-CM

## 2019-01-12 DIAGNOSIS — E119 Type 2 diabetes mellitus without complications: Secondary | ICD-10-CM

## 2019-01-12 DIAGNOSIS — I10 Essential (primary) hypertension: Secondary | ICD-10-CM

## 2019-01-13 NOTE — Progress Notes (Signed)
New Outpatient Visit Date: 01/14/2019  Referring Provider: [address] [place],  [place] [address]  Chief Complaint: Coronary artery disease and heart failure  HPI:  Megan Shelton is a 78 y.o. female who is being seen today for the evaluation of cardiomyopathy.  She was seen once in our office in 06/2017 by Dr. Caryl Comes (EP) and has continued with remote monitoring since then. She has a history of coronary artery disease with remote anterior MI, ischemic cardiomyopathy with normalization of LVEF status post ICD, hypertension, and diabetes mellitus.   Today, Megan Shelton reports that she is feeling well.  She has chronic exertional dyspnea when walking up hills, stable for years.  She denies chest pain, palpitations, lightheadedness, orthopnea, and edema.  She reports that her heart disease began while she was living in [place] in 2008.  She had acute onset of shortness of breath and was diagnosed with heart failure.  She was transferred to [facility] and underwent cardiac catheterization that showed severe disease of the proximal LAD and OM1 (suspect CTO of proximal LAD as intervention was not attempted at that time).  She underwent successful PCI to OM1 and subsequent ICD placement.  Megan Shelton moved to [place] in 04/2017.  She has been relatively active but notes that she put on about 10 pounds over the last 6 months secondary to dietary indiscretion in the setting of the COVID-19 pandemic.  She also reports that she does not take aspirin on a regular basis.  --------------------------------------------------------------------------------------------------  Cardiovascular History & Procedures: Cardiovascular Problems:  Coronary artery disease  Ischemic cardiomyopathy with normalization of LVEF  Risk Factors:  No coronary artery disease, hypertension, diabetes mellitus, and age greater than 67  Cath/PCI:  LHC/PCI (04/2006,  [place]): Severe disease involving the proximal LAD (question CTO) and 90% OM1 stenosis.  Successful PCI to OM1.  CV Surgery:  None available  EP Procedures and Devices:  ICD implantation  Non-Invasive Evaluation(s):  Outside echocardiogram (10/08/2016): Normal LV size with mild LVH.  LVEF greater than 55% with grade 1 diastolic dysfunction.  Normal RV size and function.  Mild left atrial enlargement.  Aortic sclerosis.  Mild to moderate tricuspid regurgitation.  Recent CV Pertinent Labs: Lab Results  Component Value Date   CHOL 156 11/21/2018   HDL 55 11/21/2018   LDLCALC 69 11/21/2018   TRIG 158 (H) 11/21/2018   CHOLHDL 2.8 11/21/2018   K 4.3 11/21/2018   BUN 12 11/21/2018   CREATININE 0.74 11/21/2018    --------------------------------------------------------------------------------------------------  Past Medical History:  Diagnosis Date  . Diabetes mellitus with neuropathy ([place])   . Hypertensive heart disease   . Implantable cardioverter-defibrillator (ICD)-[employer]   . Myocardial infarction, anterior wall ([place])   . Obesity, Class II, BMI 35-39.9     Past Surgical History:  Procedure Laterality Date  . CARPAL TUNNEL RELEASE    . EYE SURGERY      Current Meds  Medication Sig  . aspirin 81 MG tablet Take 81 mg by mouth daily.  Marland Kitchen atorvastatin (LIPITOR) 40 MG tablet TAKE 1 TABLET BY MOUTH  DAILY  . Blood Glucose Monitoring Suppl (ACCU-CHEK COMPACT CARE KIT) KIT To check blood sugar daily.  . clonazePAM (KLONOPIN) 1 MG tablet TAKE 1 TABLET BY MOUTH 3 TIMES A DAY AS NEEDED FOR ANXIETY  . Coenzyme Q10 (COQ10 PO) Take 1 capsule by mouth daily.  . furosemide (LASIX) 40 MG tablet TAKE 1 TABLET BY MOUTH  EVERY DAY AS NEEDED  . glimepiride (AMARYL) 1 MG tablet TAKE 1 TABLET BY MOUTH TWO  TIMES DAILY  . glucose blood (ACCU-CHEK COMPACT PLUS) test strip To check blood sugar daily.  . Lancets (ACCU-CHEK SOFT TOUCH) lancets To check blood sugar daily.  Marland Kitchen  levothyroxine (SYNTHROID) 112 MCG tablet TAKE 1 TABLET BY MOUTH  DAILY BEFORE BREAKFAST  . lisinopril (ZESTRIL) 10 MG tablet TAKE 1 TABLET BY MOUTH  DAILY  . metFORMIN (GLUCOPHAGE-XR) 500 MG 24 hr tablet TAKE 1 TABLET BY MOUTH IN  THE MORNING AND 2 TABLETS  IN THE EVENING (Patient taking differently: Take 500 mg by mouth 2 (two) times daily. )  . metoprolol tartrate (LOPRESSOR) 25 MG tablet TAKE 1 TABLET BY MOUTH TWO  TIMES DAILY  . ONETOUCH DELICA LANCETS 55E MISC CHECK BLOOD SUGAR ONCE  DAILY  . pyridOXINE (VITAMIN B-6) 100 MG tablet Take 100 mg by mouth daily.  . sitaGLIPtin (JANUVIA) 100 MG tablet Take 100 mg by mouth daily.  . vitamin B-12 (CYANOCOBALAMIN) 100 MCG tablet Take 100 mcg by mouth daily.    Allergies: Latex and Nickel  Social History   Tobacco Use  . Smoking status: Former Smoker    Types: Cigarettes  . Smokeless tobacco: Never Used  . Tobacco comment: quit about 18 years ago; as of 2019  Substance Use Topics  . Alcohol use: No    Frequency: Never  . Drug use: No    Family History  Problem Relation Age of Onset  . Rashes / Skin problems Mother   . Cancer Father 28       colon cancer  . Heart disease Brother        died of heart attack  . Heart disease Brother     Review of Systems: A 12-system review of systems was performed and was negative except as noted in the HPI.  --------------------------------------------------------------------------------------------------  Physical Exam: BP 138/70 (BP Location: Left Arm, Patient Position: Sitting, Cuff Size: Normal)   Temp (!) 96.4 F (35.8 C)   Ht 5' (1.524 m)   Wt 166 lb (75.3 kg)   BMI 32.42 kg/m   General: NAD. HEENT: No conjunctival pallor or scleral icterus.  Facemask in place. Neck: Supple without lymphadenopathy, thyromegaly, JVD, or HJR. No carotid bruit. Lungs: Normal work of breathing. Clear to auscultation bilaterally without wheezes or crackles. Heart: Regular rate and rhythm without  murmurs, rubs, or gallops. Non-displaced PMI. Abd: Bowel sounds present. Soft, NT/ND without hepatosplenomegaly Ext: No lower extremity edema. Radial, PT, and DP pulses are 2+ bilaterally Skin: Warm and dry without rash. Neuro: CNIII-XII intact. Strength and fine-touch sensation intact in upper and lower extremities bilaterally. Psych: Normal mood and affect.  EKG: Normal sinus rhythm with incomplete right bundle branch block, poor R wave progression, and anterolateral T wave inversions.  No significant change from prior tracing on 06/18/2017.  Lab Results  Component Value Date   WBC 8.9 11/21/2018   HGB 15.3 11/21/2018   HCT 46.2 11/21/2018   MCV 86 11/21/2018   PLT 245 11/21/2018    Lab Results  Component Value Date   NA 140 11/21/2018   K 4.3 11/21/2018   CL 96 11/21/2018   CO2 26 11/21/2018   BUN 12 11/21/2018   CREATININE 0.74 11/21/2018   GLUCOSE 78 11/21/2018   ALT 18 11/21/2018    Lab Results  Component Value Date   CHOL 156 11/21/2018   HDL 55 11/21/2018   LDLCALC 69 11/21/2018  TRIG 158 (H) 11/21/2018   CHOLHDL 2.8 11/21/2018     --------------------------------------------------------------------------------------------------  ASSESSMENT AND PLAN: Coronary artery disease without angina: No chest pain to suggest worsening coronary insufficiency.  Though complete catheterization report from 2008 is not available, I suspect that proximal LAD disease likely represents a CTO.  Megan Shelton should continue her current regimen of aspirin, atorvastatin, and metoprolol for secondary prevention.  I stressed the importance of compliance, including daily low-dose aspirin use.  Chronic heart failure with recovered ejection fraction secondary to ischemic cardiomyopathy: Megan Shelton appears euvolemic with stable NYHA class II heart failure symptoms.  I recommend continuation of current doses of metoprolol, furosemide, and lisinopril.  If blood pressure remains elevated in the  future, I would advocate for escalation of lisinopril.  Continue device clinic follow-up of ICD.  Hypertension: Blood pressure mildly elevated today (goal less than 130/80).  Megan Shelton attributes some of this to having to rush to the office visit today and notes that her blood pressure is typically lower at home.  If remains persistently above goal, I would recommend further escalation of lisinopril.  I stressed the importance of lifestyle modifications, including weight loss through diet and exercise, as well as sodium restriction.  Hyperlipidemia: LDL at goal (69) on last check in August.  Continue atorvastatin 40 mg daily.  Follow-up: Return to clinic in 6 months.  Nelva Bush, MD 01/15/2019 3:53 PM

## 2019-01-14 ENCOUNTER — Encounter: Payer: Self-pay | Admitting: Internal Medicine

## 2019-01-14 ENCOUNTER — Other Ambulatory Visit: Payer: Self-pay

## 2019-01-14 ENCOUNTER — Ambulatory Visit (INDEPENDENT_AMBULATORY_CARE_PROVIDER_SITE_OTHER): Payer: Medicare Other | Admitting: Internal Medicine

## 2019-01-14 VITALS — BP 138/70 | Temp 96.4°F | Ht 60.0 in | Wt 166.0 lb

## 2019-01-14 DIAGNOSIS — I2510 Atherosclerotic heart disease of native coronary artery without angina pectoris: Secondary | ICD-10-CM | POA: Diagnosis not present

## 2019-01-14 DIAGNOSIS — I10 Essential (primary) hypertension: Secondary | ICD-10-CM

## 2019-01-14 DIAGNOSIS — I5022 Chronic systolic (congestive) heart failure: Secondary | ICD-10-CM

## 2019-01-14 DIAGNOSIS — E785 Hyperlipidemia, unspecified: Secondary | ICD-10-CM

## 2019-01-14 DIAGNOSIS — I255 Ischemic cardiomyopathy: Secondary | ICD-10-CM

## 2019-01-14 NOTE — Patient Instructions (Signed)
Medication Instructions:  Your physician recommends that you continue on your current medications as directed. Please refer to the Current Medication list given to you today.  If you need a refill on your cardiac medications before your next appointment, please call your pharmacy.   Lab work: NONE If you have labs (blood work) drawn today and your tests are completely normal, you will receive your results only by: Marland Kitchen MyChart Message (if you have MyChart) OR . A paper copy in the mail If you have any lab test that is abnormal or we need to change your treatment, we will call you to review the results.  Testing/Procedures: NONE  Follow-Up: At [facility], you and your health needs are our priority.  As part of our continuing mission to provide you with exceptional heart care, we have created designated Provider Care Teams.  These Care Teams include your primary Cardiologist (physician) and Advanced Practice Providers (APPs -  Physician Assistants and Nurse Practitioners) who all work together to provide you with the care you need, when you need it. You will need a follow up appointment in 6 months.   Please call our office 2 months in advance to schedule this appointment.  You may see DR Harrell Gave END or one of the following Advanced Practice Providers on your designated Care Team:   Murray Hodgkins, NP Christell Faith, PA-C . Marrianne Mood, PA-C

## 2019-01-15 ENCOUNTER — Encounter: Payer: Self-pay | Admitting: Internal Medicine

## 2019-01-15 DIAGNOSIS — I5022 Chronic systolic (congestive) heart failure: Secondary | ICD-10-CM | POA: Insufficient documentation

## 2019-01-15 DIAGNOSIS — I2510 Atherosclerotic heart disease of native coronary artery without angina pectoris: Secondary | ICD-10-CM | POA: Insufficient documentation

## 2019-01-15 DIAGNOSIS — E785 Hyperlipidemia, unspecified: Secondary | ICD-10-CM | POA: Insufficient documentation

## 2019-01-15 DIAGNOSIS — I255 Ischemic cardiomyopathy: Secondary | ICD-10-CM | POA: Insufficient documentation

## 2019-02-02 ENCOUNTER — Ambulatory Visit (INDEPENDENT_AMBULATORY_CARE_PROVIDER_SITE_OTHER): Payer: Medicare Other

## 2019-02-02 DIAGNOSIS — I509 Heart failure, unspecified: Secondary | ICD-10-CM

## 2019-02-02 DIAGNOSIS — Z95810 Presence of automatic (implantable) cardiac defibrillator: Secondary | ICD-10-CM

## 2019-02-02 NOTE — Progress Notes (Signed)
EPIC Encounter for ICM Monitoring  Patient Name: Megan Shelton is a 78 y.o. female Date: 02/02/2019 Primary Care Physican: Mar Daring, PA-C Primary Cardiologist:Klein Electrophysiologist:Klein 02/02/2019 Weight:162 lbs   Spoke with patient.  She is asymptomatic for fluid accumulation.  She has gained weight over the last 6 months due eating more calories than usual.  OptivolThoracic impedancesuggesting possible ongoing fluid accumulation since 01/19/2019.  Prescribed:Furosemide40 mg take 1 tablet by mouth as needed  Labs: 11/21/2018 Creatinine 0.74, BUN 12, Potassium 4.3, Sodiums 140, GFR 79-91 05/16/2018 Creatinine 0.74, BUN 24, Potassium 4.4, Sodium 140, GFR 79-91  Recommendations:  She plans to take PRN Lasix 1 tablet daily x 1-2 days.   Follow-up plan: ICM clinic phone appointment on 02/09/2019 to recheck fluid levels.   91 day device clinic remote transmission 03/23/2019.    Copy of ICM check sent to Dr. Caryl Comes.   3 month ICM trend: 02/02/2019    1 Year ICM trend:       Rosalene Billings, RN 02/02/2019 10:24 AM

## 2019-02-09 ENCOUNTER — Ambulatory Visit (INDEPENDENT_AMBULATORY_CARE_PROVIDER_SITE_OTHER): Payer: Medicare Other

## 2019-02-09 DIAGNOSIS — I509 Heart failure, unspecified: Secondary | ICD-10-CM

## 2019-02-09 DIAGNOSIS — Z95810 Presence of automatic (implantable) cardiac defibrillator: Secondary | ICD-10-CM

## 2019-02-09 NOTE — Progress Notes (Signed)
EPIC Encounter for ICM Monitoring  Patient Name: Megan Shelton is a 78 y.o. female Date: 02/09/2019 Primary Care Physican: Mar Daring, PA-C Primary Cardiologist:Klein Electrophysiologist:Klein 02/02/2019 Weight:162 lbs   Spoke with patient and she is asymptomatic for fluid accumulation.  She did take PRN Furosemide as discussed on 10/26 which returned impedance back to normal.  Did advise she is starting to show possible fluid accumulation again on 11/2 and she will take PRN Furosemide tomorrow.   OptivolThoracic impedance returned to normal after taking PRN Furosemide but has returned to trending below impedance suggesting fluid accumulation again.  Prescribed:Furosemide40 mg take 1 tablet by mouth as needed  Labs: 11/21/2018 Creatinine 0.74, BUN 12, Potassium 4.3, Sodiums 140, GFR 79-91 05/16/2018 Creatinine 0.74, BUN 24, Potassium 4.4, Sodium 140, GFR 79-91  Recommendations:  She plans to take PRN Lasix 1 tablet x 1 day.  Follow-up plan: ICM clinic phone appointment on 02/23/2019 to recheck fluid levels.   91 day device clinic remote transmission 03/23/2019.    Copy of ICM check sent to Dr. Caryl Comes.   3 month ICM trend: 02/09/2019    1 Year ICM trend:       Rosalene Billings, RN 02/09/2019 12:47 PM

## 2019-02-10 NOTE — Patient Instructions (Signed)
[id number]

## 2019-02-14 ENCOUNTER — Other Ambulatory Visit: Payer: Self-pay

## 2019-02-14 ENCOUNTER — Emergency Department: Payer: Medicare Other

## 2019-02-14 ENCOUNTER — Encounter: Payer: Self-pay | Admitting: Emergency Medicine

## 2019-02-14 ENCOUNTER — Emergency Department
Admission: EM | Admit: 2019-02-14 | Discharge: 2019-02-14 | Disposition: A | Discharge status: Home / Self Care | Payer: Medicare Other | Attending: Emergency Medicine | Admitting: Emergency Medicine

## 2019-02-14 DIAGNOSIS — Z79899 Other long term (current) drug therapy: Secondary | ICD-10-CM | POA: Diagnosis not present

## 2019-02-14 DIAGNOSIS — S4992XA Unspecified injury of left shoulder and upper arm, initial encounter: Secondary | ICD-10-CM | POA: Diagnosis present

## 2019-02-14 DIAGNOSIS — S42352A Displaced comminuted fracture of shaft of humerus, left arm, initial encounter for closed fracture: Secondary | ICD-10-CM | POA: Insufficient documentation

## 2019-02-14 DIAGNOSIS — T1490XA Injury, unspecified, initial encounter: Secondary | ICD-10-CM

## 2019-02-14 DIAGNOSIS — E119 Type 2 diabetes mellitus without complications: Secondary | ICD-10-CM | POA: Insufficient documentation

## 2019-02-14 DIAGNOSIS — Y999 Unspecified external cause status: Secondary | ICD-10-CM | POA: Diagnosis not present

## 2019-02-14 DIAGNOSIS — I2510 Atherosclerotic heart disease of native coronary artery without angina pectoris: Secondary | ICD-10-CM | POA: Insufficient documentation

## 2019-02-14 DIAGNOSIS — Z7982 Long term (current) use of aspirin: Secondary | ICD-10-CM | POA: Insufficient documentation

## 2019-02-14 DIAGNOSIS — Z87891 Personal history of nicotine dependence: Secondary | ICD-10-CM | POA: Insufficient documentation

## 2019-02-14 DIAGNOSIS — Y9389 Activity, other specified: Secondary | ICD-10-CM | POA: Diagnosis not present

## 2019-02-14 DIAGNOSIS — I110 Hypertensive heart disease with heart failure: Secondary | ICD-10-CM | POA: Diagnosis not present

## 2019-02-14 DIAGNOSIS — I509 Heart failure, unspecified: Secondary | ICD-10-CM | POA: Insufficient documentation

## 2019-02-14 DIAGNOSIS — Y92009 Unspecified place in unspecified non-institutional (private) residence as the place of occurrence of the external cause: Secondary | ICD-10-CM | POA: Diagnosis not present

## 2019-02-14 DIAGNOSIS — Z7984 Long term (current) use of oral hypoglycemic drugs: Secondary | ICD-10-CM | POA: Insufficient documentation

## 2019-02-14 DIAGNOSIS — X500XXA Overexertion from strenuous movement or load, initial encounter: Secondary | ICD-10-CM | POA: Diagnosis not present

## 2019-02-14 MED ORDER — ONDANSETRON 4 MG PO TBDP: 4.0000 mg | ORAL_TABLET | Freq: Three times a day (TID) | ORAL | 0 refills | Status: DC | PRN

## 2019-02-14 MED ORDER — OXYCODONE-ACETAMINOPHEN 5-325 MG PO TABS: 1.0000 | ORAL_TABLET | Freq: Four times a day (QID) | ORAL | 0 refills | Status: DC | PRN

## 2019-02-14 MED ORDER — MORPHINE SULFATE (PF) 4 MG/ML IV SOLN
4.0000 mg | Freq: Once | INTRAVENOUS | Status: AC
  Administered 2019-02-14: 13:00:00 4 mg via INTRAMUSCULAR
  Filled 2019-02-14: qty 1

## 2019-02-14 MED ORDER — MORPHINE SULFATE (PF) 4 MG/ML IV SOLN: 4.0000 mg | Freq: Once | INTRAVENOUS | Status: DC

## 2019-02-14 NOTE — ED Notes (Signed)
EDP at bedside, pt in agreement with discharge at this time.

## 2019-02-14 NOTE — ED Notes (Signed)
Pt son Everest Hacking in the car. [phone]

## 2019-02-14 NOTE — ED Notes (Signed)
This RN, Yetta Flock, RN, and Dr. Joni Fears at beside while splint was being applied. Dr. Joni Fears able to answer all questions at that time. This RN asked Edison Nasuti, EDT to wheel patient out to lobby. This RN notified by Edison Nasuti, EDT that patient doesn't want to leave due to "having too many questions" per Edison Nasuti, EDT pt also stated to him that she feels like she should have a cast and not an immobilizer. Dr. Joni Fears made aware, states will go speak with the patient at this time.

## 2019-02-14 NOTE — ED Triage Notes (Signed)
Pt reports was flipping her mattress this am and thinks she dislocated her left shoulder.

## 2019-02-14 NOTE — ED Notes (Signed)
Pt states that she was trying to put on a new comforter set this morning and she tried to push her mattress and boxspring when she heard a crack. States that she had immediate pain and screamed. Her left arm appears red with limited swelling.

## 2019-02-14 NOTE — Discharge Instructions (Signed)
Keep the velcro sling arm immobilizer in place at all times to help protect your broken upper arm.  Our orthopedist recommends calling Dr. Marcelino Scot or Dr. Doreatha Martin on Monday for follow up due to scheduling constraints with our [facility] clinic.    If you have worsening severe pain the arm, or if your left lower arm and hand become weak, cold, pale, numb, or severely painful, please return to the ER right away for repeat evaluation.

## 2019-02-14 NOTE — ED Provider Notes (Signed)
[facility] Emergency Department Provider Note  ____________________________________________  Time seen: Approximately 12:28 PM  I have reviewed the triage vital signs and the nursing notes.   HISTORY  Chief Complaint Shoulder Injury    HPI Day Greb is a 78 y.o. female with a history of hypertension, diabetes, CAD, implanted ICD who comes to the ED complaining of severe onset of left arm pain after trying to move her mattress at home .  During this time she heard a crack and had severe onset of pain which is nonradiating, worse with movement, no alleviating factors.  Denies any other trauma.  She does report some chronic paresthesia in the left lower arm related to prior carpal tunnel surgery.  Denies any new weakness coldness paresthesia numbness or pain in the left lower arm or hand.     Past Medical History:  Diagnosis Date  . Diabetes mellitus with neuropathy ([place])   . Hypertensive heart disease   . Implantable cardioverter-defibrillator (ICD)-[employer]   . Myocardial infarction, anterior wall ([place])   . Obesity, Class II, BMI 35-39.9      Patient Active Problem List   Diagnosis Date Noted  . Coronary artery disease involving native coronary artery of native heart without angina pectoris 01/15/2019  . Chronic HFrEF (heart failure with reduced ejection fraction) ([place]) 01/15/2019  . Ischemic cardiomyopathy 01/15/2019  . Hyperlipidemia LDL goal <70 01/15/2019  . GAD (generalized anxiety disorder) 05/16/2018  . Allergic rhinitis 05/03/2017  . Arthritis 05/03/2017  . Cataract 05/03/2017  . Congestive heart failure ([place]) 05/03/2017  . Diabetes mellitus with neuropathy ([place]) 05/03/2017  . Essential hypertension 05/03/2017  . History of heart attack 05/03/2017  . H/O thyroidectomy 05/03/2017  . H/O total knee replacement 05/03/2017     Past Surgical History:  Procedure Laterality Date  . CARPAL TUNNEL RELEASE    . EYE SURGERY        Prior to Admission medications   Medication Sig Start Date End Date Taking? Authorizing Provider  aspirin 81 MG tablet Take 81 mg by mouth daily.    [provider]  atorvastatin (LIPITOR) 40 MG tablet TAKE 1 TABLET BY MOUTH  DAILY 01/12/19   Mar Daring, PA-C  Blood Glucose Monitoring Suppl (ACCU-CHEK COMPACT CARE KIT) KIT To check blood sugar daily. 11/24/18   Mar Daring, PA-C  clonazePAM (KLONOPIN) 1 MG tablet TAKE 1 TABLET BY MOUTH 3 TIMES A DAY AS NEEDED FOR ANXIETY 11/21/18   Mar Daring, PA-C  Coenzyme Q10 (COQ10 PO) Take 1 capsule by mouth daily.    [provider]  furosemide (LASIX) 40 MG tablet TAKE 1 TABLET BY MOUTH EVERY DAY AS NEEDED 05/12/18   Mar Daring, PA-C  glimepiride (AMARYL) 1 MG tablet TAKE 1 TABLET BY MOUTH TWO  TIMES DAILY 01/12/19   Mar Daring, PA-C  glucose blood (ACCU-CHEK COMPACT PLUS) test strip To check blood sugar daily. 11/24/18   Mar Daring, PA-C  Lancets (ACCU-CHEK SOFT TOUCH) lancets To check blood sugar daily. 11/24/18   Mar Daring, PA-C  levothyroxine (SYNTHROID) 112 MCG tablet TAKE 1 TABLET BY MOUTH  DAILY BEFORE BREAKFAST 01/12/19   Fenton Malling M, PA-C  lisinopril (ZESTRIL) 10 MG tablet TAKE 1 TABLET BY MOUTH  DAILY 01/12/19   Fenton Malling M, PA-C  metFORMIN (GLUCOPHAGE-XR) 500 MG 24 hr tablet TAKE 1 TABLET BY MOUTH IN  THE MORNING AND 2 TABLETS  IN THE EVENING Patient taking differently: Take 500 mg by  mouth 2 (two) times daily.  12/23/17   Mar Daring, PA-C  metoprolol tartrate (LOPRESSOR) 25 MG tablet TAKE 1 TABLET BY MOUTH TWO  TIMES DAILY 06/16/18   Mar Daring, PA-C  ondansetron (ZOFRAN ODT) 4 MG disintegrating tablet Take 1 tablet (4 mg total) by mouth every 8 (eight) hours as needed for nausea or vomiting. 02/14/19   Carrie Mew, MD  [facility] DELICA LANCETS 00Q MISC CHECK BLOOD SUGAR ONCE  DAILY 10/11/17   Mar Daring, PA-C   oxyCODONE-acetaminophen (PERCOCET) 5-325 MG tablet Take 1 tablet by mouth every 6 (six) hours as needed for severe pain. 02/14/19 02/14/20  Carrie Mew, MD  pyridOXINE (VITAMIN B-6) 100 MG tablet Take 100 mg by mouth daily.    [provider]  sitaGLIPtin (JANUVIA) 100 MG tablet Take 100 mg by mouth daily.    [provider]  vitamin B-12 (CYANOCOBALAMIN) 100 MCG tablet Take 100 mcg by mouth daily.    [provider]     Allergies Latex and Nickel   Family History  Problem Relation Age of Onset  . Rashes / Skin problems Mother   . Cancer Father 49       colon cancer  . Heart disease Brother        died of heart attack  . Heart disease Brother     Social History Social History   Tobacco Use  . Smoking status: Former Smoker    Types: Cigarettes  . Smokeless tobacco: Never Used  . Tobacco comment: quit about 18 years ago; as of 2019  Substance Use Topics  . Alcohol use: No    Frequency: Never  . Drug use: No    Review of Systems  Constitutional:   No fever or chills.  ENT:   No sore throat. No rhinorrhea. Cardiovascular:   No chest pain or syncope. Respiratory:   No dyspnea or cough. Gastrointestinal:   Negative for abdominal pain, vomiting and diarrhea.  Musculoskeletal:   Left arm pain as above All other systems reviewed and are negative except as documented above in ROS and HPI.  ____________________________________________   PHYSICAL EXAM:  VITAL SIGNS: ED Triage Vitals  Enc Vitals Group     BP 02/14/19 1023 (!) 150/54     Pulse Rate 02/14/19 1023 70     Resp 02/14/19 1023 16     Temp 02/14/19 1023 98 F (36.7 C)     Temp Source 02/14/19 1023 Oral     SpO2 02/14/19 1023 94 %     Weight 02/14/19 1020 158 lb (71.7 kg)     Height 02/14/19 1020 5' (1.524 m)     Head Circumference --      Peak Flow --      Pain Score 02/14/19 1019 7     Pain Loc --      Pain Edu? --      Excl. in [place]? --     Vital signs reviewed,  nursing assessments reviewed.   Constitutional:   Alert and oriented. Non-toxic appearance. Eyes:   Conjunctivae are normal. EOMI. ENT      Head:   Normocephalic and atraumatic.      Mouth/Throat:   MMM      Neck:   No meningismus. Full ROM. Hematological/Lymphatic/Immunilogical:   No cervical lymphadenopathy. Cardiovascular:   RRR. Symmetric bilateral radial  pulses.   Cap refill less than 2 seconds. Respiratory:   Normal respiratory effort without tachypnea/retractions.   Musculoskeletal: Left arm  severely painful with any movement.  Tender over the mid humerus.  No pronounced swelling. Neurologic:   Normal speech and language.  Motor grossly intact. No acute focal neurologic deficits are appreciated.  Skin:    Skin is warm, dry and intact. No rash noted.  No wounds.  No pallor or poikilothermia ____________________________________________    LABS (pertinent positives/negatives) (all labs ordered are listed, but only abnormal results are displayed) Labs Reviewed - No data to display ____________________________________________   EKG  ____________________________________________    RADIOLOGY  Dg Humerus Left  Result Date: 02/14/2019 CLINICAL DATA:  Pain after physical exertion EXAM: LEFT HUMERUS - 2+ VIEW COMPARISON:  None. FINDINGS: Transverse minimally comminuted fracture through the midshaft of the humerus with nearly shaft with displacement of the major fracture fragments, mild posteromedial angulation of distal fracture fragment. The margins of fracture are somewhat indistinct in regions and cannot entirely exclude underlying lytic lesion. Patient does appear diffusely osteopenic. No other discrete bone lesions are identified. Left subclavian pacemaker partially visualized. Aortic Atherosclerosis (ICD10-170.0). IMPRESSION: 1. Comminuted, possibly pathologic mid shaft humeral fracture with mild posteromedial angulation. 2. Osteopenia. Electronically Signed   By: Lucrezia Europe  M.D.   On: 02/14/2019 11:14    ____________________________________________   PROCEDURES Procedures  ____________________________________________  CLINICAL IMPRESSION / ASSESSMENT AND PLAN / ED COURSE  Pertinent labs & imaging results that were available during my care of the patient were reviewed by me and considered in my medical decision making (see chart for details).  Lorna Strother was evaluated in Emergency Department on 02/14/2019 for the symptoms described in the history of present illness. She was evaluated in the context of the global COVID-19 pandemic, which necessitated consideration that the patient might be at risk for infection with the SARS-CoV-2 virus that causes COVID-19. Institutional protocols and algorithms that pertain to the evaluation of patients at risk for COVID-19 are in a state of rapid change based on information released by regulatory bodies including the CDC and federal and state organizations. These policies and algorithms were followed during the patient's care in the ED.   Patient presents with sudden left arm pain after moving her mattress.  X-ray shows a comminuted displaced fracture of the mid humerus.  Discussed with orthopedics Dr. Roland Rack who advises sling immobilizer, follow-up in clinic.  Due to scheduling constraints in his own clinic he recommends referring to Dr. Marcelino Scot or Dr. Doreatha Martin in [place].  Patient is not having any other acute symptoms or weight loss, she has been in her usual state of health before this happened.      ____________________________________________   FINAL CLINICAL IMPRESSION(S) / ED DIAGNOSES    Final diagnoses:  Displaced comminuted fracture of shaft of humerus, left arm, initial encounter for closed fracture     ED Discharge Orders         Ordered    oxyCODONE-acetaminophen (PERCOCET) 5-325 MG tablet  Every 6 hours PRN     02/14/19 1226    ondansetron (ZOFRAN ODT) 4 MG disintegrating tablet  Every 8 hours PRN      02/14/19 1226          Portions of this note were generated with dragon dictation software. Dictation errors may occur despite best attempts at proofreading.   Carrie Mew, MD 02/14/19 1234

## 2019-02-20 DIAGNOSIS — S42322A Displaced transverse fracture of shaft of humerus, left arm, initial encounter for closed fracture: Secondary | ICD-10-CM | POA: Insufficient documentation

## 2019-02-24 ENCOUNTER — Telehealth: Payer: Self-pay

## 2019-02-24 NOTE — Telephone Encounter (Signed)
Left message for patient to remind of missed remote transmission.  

## 2019-02-25 ENCOUNTER — Encounter: Payer: Self-pay | Admitting: Adult Health

## 2019-02-25 ENCOUNTER — Ambulatory Visit (INDEPENDENT_AMBULATORY_CARE_PROVIDER_SITE_OTHER): Payer: Medicare Other | Admitting: Adult Health

## 2019-02-25 ENCOUNTER — Other Ambulatory Visit: Payer: Self-pay

## 2019-02-25 DIAGNOSIS — H01119 Allergic dermatitis of unspecified eye, unspecified eyelid: Secondary | ICD-10-CM | POA: Insufficient documentation

## 2019-02-25 MED ORDER — HYDROCORTISONE 1 % EX OINT: 1.0000 "application " | TOPICAL_OINTMENT | Freq: Two times a day (BID) | CUTANEOUS | 0 refills | Status: DC

## 2019-02-25 MED ORDER — ERYTHROMYCIN 5 MG/GM OP OINT: 1.0000 "application " | TOPICAL_OINTMENT | Freq: Four times a day (QID) | OPHTHALMIC | 0 refills | Status: DC

## 2019-02-25 NOTE — Progress Notes (Addendum)
Virtual Visit via Telephone Note  I connected with Megan Shelton on 02/25/19 at 11:00 AM EST by telephone and verified that I am speaking with the correct person using two identifiers.   I discussed the limitations, risks, security and privacy concerns of performing an evaluation and management service by telephone and the availability of in person appointments. I also discussed with the patient that there may be a patient responsible charge related to this service. The patient expressed understanding and agreed to proceed.   History of Present Illness: Allergies  Allergen Reactions  . Latex   . Nickel      Patient is a 78 year old female who complains of irritation she developed after using old eye make up.  She reports she has eyelid irritation bilateral eyes for the last 5 days.  She was scheduled for a video visit - however technology was not working - it was recommended she come into the office for and in person visit - however she declined due to a broken arm and prefers to stay at home. She was advised of the risks oaf visit and the immediate need to be seen in person if any symptoms worsen or change.   Denies any vision changes.  Denies any swelling in eyes.  Onset  5 days ago after using old makeup on bilateral eyes.  Describes as mild redness on bilateral eyelids only without any swelling.  Denies any new medications.  She tried a ointment that was meant for a stye without any relief.  Denies any pain  denies red eyes. Denies any vision changes.  She denies any bumps or lumps on her eyes. Denies any redness on her face or any spreading of redness.  Patient  denies any fever, body aches,chills,chest pain, shortness of breath, nausea, vomiting, or diarrhea.  Reports pain 0/10.  Denies any other rash.   New medical history : Closed displaced transverse fracture of shaft of left humerus Being followed. Denies any change.  Observations/Objective:   Patient is alert and  oriented and responsive to questions Engages in conversation with provider. Speaks in full sentences without any pauses without any shortness of breath or distress.   Assessment and Plan: 1. Eyelid dermatitis, allergic/contact- bilateral   Meds ordered this encounter  Medications  . erythromycin ophthalmic ointment    Sig: Place 1 application into both eyes 4 (four) times daily. ( 0.5 % erythromycin ointment).Apply to both affected eyelids.    Dispense:  3.5 g    Refill:  0  . hydrocortisone 1 % ointment    Sig: Apply 1 application topically 2 (two) times daily. May use for skin irritation on bilateral eyelids. Do not get inside of eyes.    Dispense:  30 g    Refill:  0     Follow Up Instructions:  Avoid rubbing and scratching. Only touch eyelids with clean, rinsed hands. Wash eyelids with plain water or use a cream cleanser designed for sensitive skin. Cool compress TID.  Avoid all contact with allergens, Avoid eyelid cosmetics while the dermatitis is active. Short courses of mild topical corticosteroids (ie hydrocortisone 1% cream or ointment) sent to pharmacy but can get over the counter if cheaper.    Discussed RED FLAGS and when to seek treatment or see eye doctor are vision changes, fever red painful eye or swelling, spreading redness and not limited to these.   Return in about 3 days (around 02/28/2019), or if symptoms worsen or fail to improve, for at  any time for any worsening symptoms, Go to Emergency room/ urgent care if worse.  Advised patient call the office or your primary care doctor for an appointment if no improvement within 72 hours or if any symptoms change or worsen at any time  Advised ER or urgent Care if after hours or on weekend. Call 911 for emergency symptoms at any time.Patinet verbalized understanding of all instructions given/reviewed and treatment plan and has no further questions or concerns at this time.     I discussed the assessment and treatment  plan with the patient. The patient was provided an opportunity to ask questions and all were answered. The patient agreed with the plan and demonstrated an understanding of the instructions.   The patient was advised to call back or seek an in-person evaluation if the symptoms worsen or if the condition fails to improve as anticipated.  I provided  15 minutes of non-face-to-face time during this encounter.   Marcille Buffy, FNP

## 2019-02-25 NOTE — Patient Instructions (Signed)
Blepharitis Blepharitis is swelling of the eyelids. Symptoms may include:  Reddish, scaly skin around the scalp and eyebrows.  Burning or itching of the eyelids.  Fluid coming from the eye at night. This causes the eyelashes to stick together in the morning.  Eyelashes that fall out.  Being sensitive to light. Follow these instructions at home: Pay attention to any changes in how you look or feel. Tell your health care provider about any changes. Follow these instructions to help with your condition: Keeping clean   Wash your hands often.  Wash your eyelids with warm water, or wash them with warm water that is mixed with little bit of baby shampoo. Do this 2 or more times per day.  Wash your face and eyebrows at least once a day.  Use a clean towel each time you dry your eyelids. Do not use the towel to clean or dry other areas of your body. Do not share your towel with anyone. General instructions  Avoid wearing makeup until you get better. Do not share makeup with anyone.  Avoid rubbing your eyes.  Put a warm compress on your eyes 2 times per day for 10 minutes at a time, or as told by your doctor.  If you were given antibiotics in the form of creams or eye drops, use the medicine as told by your doctor. Do not stop using the medicine even if you feel better.  Keep all follow-up visits as told by your doctor. This is important. Contact a doctor if:  Your eyelids feel hot.  You have blisters on your eyelids.  You have a rash on your eyelids.  The swelling does not go away in 2-4 days.  The swelling gets worse. Get help right away if:  You have pain that gets worse.  You have pain that spreads to other parts of your face.  You have redness that gets worse.  You have redness that spreads to other parts of your face.  Your vision changes.  You have pain when you look at lights or things that move.  You have a fever. Summary  Blepharitis is swelling of the  eyelids.  Pay attention to any changes in how your eyes look or feel. Tell your doctor about any changes.  Follow home care instructions as told by your doctor. Wash your hands often. Avoid wearing makeup. Do not rub your eyes.  Use warm compresses, creams, or eye drops as told by your doctor.  Let your doctor know if you have changes in vision, blisters or rash on eyelids, pain that spreads to your face, or warmth on your eyelids. This information is not intended to replace advice given to you by your health care provider. Make sure you discuss any questions you have with your health care provider. Document Released: 01/03/2008 Document Revised: 09/23/2017 Document Reviewed: 09/23/2017 Elsevier Patient Education  [place]. Hydrocortisone skin cream, ointment, lotion, or solution What is this medicine? HYDROCORTISONE (hye droe KOR ti sone) is a corticosteroid. It is used on the skin to reduce swelling, redness, itching, and allergic reactions. This medicine may be used for other purposes; ask your health care provider or pharmacist if you have questions. COMMON BRAND NAME(S): Ala-Cort, Ala-Scalp, Anusol HC, Aqua Glycolic HC, Balneol for Her, Caldecort, Cetacort, Cortaid, Cortaid Advanced, Cortaid Intensive Therapy, Cortaid Sensitive Skin, CortAlo, Corticaine, Corticool, Cortizone, Cortizone-10, Cortizone-10 Cooling Relief, Cortizone-10 Intensive Healing, Cortizone-10 Plus, Dermarest Dricort, Dermarest Eczema, DERMASORB HC Complete, Gly-Cort, Hycort, Hydro Skin, Hydrocortisone in  Absorbase, Hydroskin, Hytone, Instacort, Lacticare HC, Locoid, Locoid Lipocream, MiCort-HC, Monistat Complete Care Instant Itch Relief Cream, Neosporin Eczema, NuCort, Nutracort, NuZon, Pandel, Pediaderm HC, Penecort, Preparation H Hydrocortisone, Procto-Kit, Procto-Med HC, Procto-Pak, Proctocort, Proctocream-HC, Proctosol-HC, Proctozone-HC, Rederm, Sarnol-HC, Nurse, adult, Engineer, site, [place], Tucks HC,  Vagisil Anti-Itch, [facility] Intensive Healing, Human resources officer What should I tell my health care provider before I take this medicine? They need to know if you have any of these conditions:  any active infection  large areas of burned or damaged skin  skin wasting or thinning  an unusual or allergic reaction to hydrocortisone, corticosteroids, sulfites, other medicines, foods, dyes, or preservatives  pregnant or trying to get pregnant  breast-feeding How should I use this medicine? This medicine is for external use only. Do not take by mouth. Follow the directions on the prescription label. Wash your hands before and after use. Apply a thin film of medicine to the affected area. Do not cover with a bandage or dressing unless your doctor or health care professional tells you to. Do not use on healthy skin or over large areas of skin. Do not get this medicine in your eyes. If you do, rinse out with plenty of cool tap water. Do not to use more medicine than prescribed. Do not use your medicine more often than directed or for more than 14 days. Talk to your pediatrician regarding the use of this medicine in children. Special care may be needed. While this drug may be prescribed for children as young as 30 years of age for selected conditions, precautions do apply. Do not use this medicine for the treatment of diaper rash unless directed to do so by your doctor or health care professional. If applying this medicine to the diaper area of a child, do not cover with tight-fitting diapers or plastic pants. This may increase the amount of medicine that passes through the skin and increase the risk of serious side effects. Elderly patients are more likely to have damaged skin through aging, and this may increase side effects. This medicine should only be used for brief periods and infrequently in older patients. Overdosage: If you think you have taken too much of this medicine contact a poison control center or  emergency room at once. NOTE: This medicine is only for you. Do not share this medicine with others. What if I miss a dose? If you miss a dose, use it as soon as you can. If it is almost time for your next dose, use only that dose. Do not use double or extra doses. What may interact with this medicine? Interactions are not expected. Do not use any other skin products on the affected area without asking your doctor or health care professional. This list may not describe all possible interactions. Give your health care provider a list of all the medicines, herbs, non-prescription drugs, or dietary supplements you use. Also tell them if you smoke, drink alcohol, or use illegal drugs. Some items may interact with your medicine. What should I watch for while using this medicine? Tell your doctor or health care professional if your symptoms do not start to get better within 7 days or if they get worse. Tell your doctor or health care professional if you are exposed to anyone with measles or chickenpox, or if you develop sores or blisters that do not heal properly. What side effects may I notice from receiving this medicine? Side effects that you should report to your doctor or health care professional as  soon as possible:  allergic reactions like skin rash, itching or hives, swelling of the face, lips, or tongue  burning feeling on the skin  dark red spots on the skin  infection  lack of healing of skin condition  painful, red, pus filled blisters in hair follicles  thinning of the skin Side effects that usually do not require medical attention (report to your doctor or health care professional if they continue or are bothersome):  dry skin, irritation  unusual increased growth of hair on the face or body This list may not describe all possible side effects. Call your doctor for medical advice about side effects. You may report side effects to FDA at 1-800-FDA-1088. Where should I keep my  medicine? Keep out of the reach of children. Store at room temperature between 15 and 30 degrees C (59 and 86 degrees F). Do not freeze. Throw away any unused medicine after the expiration date. NOTE: This sheet is a summary. It may not cover all possible information. If you have questions about this medicine, talk to your doctor, pharmacist, or health care provider.  2020 Elsevier/Gold Standard (2017-12-25 12:12:55) Erythromycin eye ointment What is this medicine? ERYTHROMYCIN (er ith roe MYE sin) is a macrolide antibiotic. It is used to treat bacterial eye infections. It also prevents a certain type of eye infection that can occur in some babies. This medicine may be used for other purposes; ask your health care provider or pharmacist if you have questions. COMMON BRAND NAME(S): Ilotycin, Romycin What should I tell my health care provider before I take this medicine?  if you have an unusual or allergic reaction to erythromycin, foods, dyes, or preservatives  pregnant or trying to get pregnant  breast-feeding How should I use this medicine? This medicine is only for use in the eye. Follow the directions on the prescription label. Wash hands before and after use. Tilt your head back slightly and pull your lower eyelid down with your index finger to form a pouch. Try not to touch the tip of the tube, to your eye, fingertips, or any other surface. Squeeze the end of the tube to apply a thin layer of the ointment to the inside of the lower eyelid. Close the eye gently to spread the ointment. Your vision may blur for a few minutes. Use your doses at regular intervals. Do not use your medicine more often than directed. Finish the full course prescribed by your doctor or health care professional even if you think your condition is better. Do not stop using except on the advice of your doctor or health care professional. Talk to your pediatrician regarding the use of this medicine in children. Special  care may be needed. Overdosage: If you think you have taken too much of this medicine contact a poison control center or emergency room at once. NOTE: This medicine is only for you. Do not share this medicine with others. What if I miss a dose? If you miss a dose, use it as soon as you can. If it is almost time for your next dose, use only that dose. Do not use double or extra doses. What may interact with this medicine? Interactions are not expected. Do not use any other eye products without telling your doctor or health care professional. This list may not describe all possible interactions. Give your health care provider a list of all the medicines, herbs, non-prescription drugs, or dietary supplements you use. Also tell them if you smoke, drink alcohol,  or use illegal drugs. Some items may interact with your medicine. What should I watch for while using this medicine? Tell your doctor or health care professional if your symptoms do not improve in 2 to 3 days. What side effects may I notice from receiving this medicine? Side effects that you should report to your doctor or health care professional as soon as possible:  allergic reactions like skin rash, itching or hives, swelling of the face, lips, or tongue  burning, stinging, or itching of the eyes or eyelids  changes in vision  redness, swelling, or pain This list may not describe all possible side effects. Call your doctor for medical advice about side effects. You may report side effects to FDA at 1-800-FDA-1088. Where should I keep my medicine? Keep out of the reach of children. Store at room temperature between 15 and 30 degrees C (59 and 86 degrees F). Do not freeze. Throw away any unused ointment after the expiration date. NOTE: This sheet is a summary. It may not cover all possible information. If you have questions about this medicine, talk to your doctor, pharmacist, or health care provider.  2020 Elsevier/Gold Standard  (2015-04-28 12:24:24) Contact Dermatitis Dermatitis is redness, soreness, and swelling (inflammation) of the skin. Contact dermatitis is a reaction to something that touches the skin. There are two types of contact dermatitis:  Irritant contact dermatitis. This happens when something bothers (irritates) your skin, like soap.  Allergic contact dermatitis. This is caused when you are exposed to something that you are allergic to, such as poison ivy. What are the causes?  Common causes of irritant contact dermatitis include: ? Makeup. ? Soaps. ? Detergents. ? Bleaches. ? Acids. ? Metals, such as nickel.  Common causes of allergic contact dermatitis include: ? Plants. ? Chemicals. ? Jewelry. ? Latex. ? Medicines. ? Preservatives in products, such as clothing. What increases the risk?  Having a job that exposes you to things that bother your skin.  Having asthma or eczema. What are the signs or symptoms? Symptoms may happen anywhere the irritant has touched your skin. Symptoms include:  Dry or flaky skin.  Redness.  Cracks.  Itching.  Pain or a burning feeling.  Blisters.  Blood or clear fluid draining from skin cracks. With allergic contact dermatitis, swelling may occur. This may happen in places such as the eyelids, mouth, or genitals. How is this treated?  This condition is treated by checking for the cause of the reaction and protecting your skin. Treatment may also include: ? Steroid creams, ointments, or medicines. ? Antibiotic medicines or other ointments, if you have a skin infection. ? Lotion or medicines to help with itching. ? A bandage (dressing). Follow these instructions at home: Skin care  Moisturize your skin as needed.  Put cool cloths on your skin.  Put a baking soda paste on your skin. Stir water into baking soda until it looks like a paste.  Do not scratch your skin.  Avoid having things rub up against your skin.  Avoid the use of  soaps, perfumes, and dyes. Medicines  Take or apply over-the-counter and prescription medicines only as told by your doctor.  If you were prescribed an antibiotic medicine, take or apply it as told by your doctor. Do not stop using it even if your condition starts to get better. Bathing  Take a bath with: ? Epsom salts. ? Baking soda. ? Colloidal oatmeal.  Bathe less often.  Bathe in warm water. Avoid using hot  water. Bandage care  If you were given a bandage, change it as told by your health care provider.  Wash your hands with soap and water before and after you change your bandage. If soap and water are not available, use hand sanitizer. General instructions  Avoid the things that caused your reaction. If you do not know what caused it, keep a journal. Write down: ? What you eat. ? What skin products you use. ? What you drink. ? What you wear in the area that has symptoms. This includes jewelry.  Check the affected areas every day for signs of infection. Check for: ? More redness, swelling, or pain. ? More fluid or blood. ? Warmth. ? Pus or a bad smell.  Keep all follow-up visits as told by your doctor. This is important. Contact a doctor if:  You do not get better with treatment.  Your condition gets worse.  You have signs of infection, such as: ? More swelling. ? Tenderness. ? More redness. ? Soreness. ? Warmth.  You have a fever.  You have new symptoms. Get help right away if:  You have a very bad headache.  You have neck pain.  Your neck is stiff.  You throw up (vomit).  You feel very sleepy.  You see red streaks coming from the area.  Your bone or joint near the area hurts after the skin has healed.  The area turns darker.  You have trouble breathing. Summary  Dermatitis is redness, soreness, and swelling of the skin.  Symptoms may occur where the irritant has touched you.  Treatment may include medicines and skin care.  If you do  not know what caused your reaction, keep a journal.  Contact a doctor if your condition gets worse or you have signs of infection. This information is not intended to replace advice given to you by your health care provider. Make sure you discuss any questions you have with your health care provider. Document Released: 01/21/2009 Document Revised: 07/16/2018 Document Reviewed: 10/09/2017 Elsevier Patient Education  2020 [employer].

## 2019-02-26 ENCOUNTER — Other Ambulatory Visit: Payer: Self-pay | Admitting: Orthopedic Surgery

## 2019-02-26 DIAGNOSIS — S42202A Unspecified fracture of upper end of left humerus, initial encounter for closed fracture: Secondary | ICD-10-CM

## 2019-02-28 ENCOUNTER — Other Ambulatory Visit: Payer: Self-pay | Admitting: Adult Health

## 2019-03-01 NOTE — Telephone Encounter (Signed)
Requested medication (s) are due for refill today:   Provider's decision  Requested medication (s) are on the active medication list:   Yes  Future visit scheduled:   No   Last ordered: 02/25/2019 3.5 g  0 refills   No protocol for this medication   Requested Prescriptions  Pending Prescriptions Disp Refills   erythromycin ophthalmic ointment [Pharmacy Med Name: ERYTHROMYCIN 0.5% EYE OINTMENT] 3.5 g 0    Sig: PLACE 1 APPLICATION INTO BOTH EYES 4 (FOUR) TIMES DAILY. APPLY TO BOTH AFFECTED EYELIDS.     Off-Protocol Failed - 02/28/2019  9:40 PM      Failed - Medication not assigned to a protocol, review manually.      Passed - Valid encounter within last 12 months    Recent Outpatient Visits          4 days ago Eyelid dermatitis, allergic/contact- bilateral    [place], FNP   3 months ago Annual physical exam   [facility] Fenton Malling M, [place]   9 months ago Essential hypertension   [place], Clearnce Sorrel, [place]   1 year ago Annual physical exam   [place], Clearnce Sorrel, [place]   1 year ago Establishing care with new doctor, encounter for   [facility] Mar Daring, [place]

## 2019-03-04 ENCOUNTER — Other Ambulatory Visit: Payer: Self-pay | Admitting: Physician Assistant

## 2019-03-04 MED ORDER — ERYTHROMYCIN 5 MG/GM OP OINT: 1.0000 "application " | TOPICAL_OINTMENT | Freq: Four times a day (QID) | OPHTHALMIC | 0 refills | Status: DC

## 2019-03-04 MED ORDER — HYDROCORTISONE 1 % EX OINT: 1.0000 "application " | TOPICAL_OINTMENT | Freq: Two times a day (BID) | CUTANEOUS | 0 refills | Status: DC

## 2019-03-04 NOTE — Telephone Encounter (Signed)
Please advise refill for erythromycin? Patient stated she was a little heavy handed with the small tube she got on 02/25/2019 and has used all of it.

## 2019-03-04 NOTE — Telephone Encounter (Signed)
Pt called and states that she would like a refill of medication erythromycin ophthalmic ointment [[id number]. Pt states that she think that her make wipes are affecting her eyes. She had to start using them because she broke her arm. Pt would like to know if a small tube could be called in. Please advise

## 2019-03-04 NOTE — Progress Notes (Signed)
No ICM remote transmission received for 02/23/2019 and next ICM transmission scheduled for 03/23/2020.

## 2019-03-11 ENCOUNTER — Ambulatory Visit
Admission: RE | Admit: 2019-03-11 | Discharge: 2019-03-11 | Disposition: A | Discharge status: Home / Self Care | Payer: Medicare Other | Source: Ambulatory Visit | Attending: Orthopedic Surgery | Admitting: Orthopedic Surgery

## 2019-03-11 ENCOUNTER — Other Ambulatory Visit: Payer: Self-pay

## 2019-03-11 DIAGNOSIS — S42202A Unspecified fracture of upper end of left humerus, initial encounter for closed fracture: Secondary | ICD-10-CM | POA: Insufficient documentation

## 2019-03-23 ENCOUNTER — Ambulatory Visit (INDEPENDENT_AMBULATORY_CARE_PROVIDER_SITE_OTHER): Payer: Medicare Other | Admitting: *Deleted

## 2019-03-23 DIAGNOSIS — Z95810 Presence of automatic (implantable) cardiac defibrillator: Secondary | ICD-10-CM

## 2019-03-23 LAB — CUP PACEART REMOTE DEVICE CHECK
Battery Remaining Longevity: 83 mo
Battery Voltage: 2.99 V
Brady Statistic RV Percent Paced: 0.01 %
Date Time Interrogation Session: 20201214063526
HighPow Impedance: 100 Ohm
Implantable Lead Implant Date: 20080121
Implantable Lead Location: 753860
Implantable Lead Model: 180
Implantable Lead Serial Number: 107986
Implantable Pulse Generator Implant Date: 20160415
Lead Channel Impedance Value: 456 Ohm
Lead Channel Impedance Value: 494 Ohm
Lead Channel Pacing Threshold Amplitude: 1.125 V
Lead Channel Pacing Threshold Pulse Width: 0.4 ms
Lead Channel Sensing Intrinsic Amplitude: 20.875 mV
Lead Channel Sensing Intrinsic Amplitude: 20.875 mV
Lead Channel Setting Pacing Amplitude: 2.25 V
Lead Channel Setting Pacing Pulse Width: 0.4 ms
Lead Channel Setting Sensing Sensitivity: 0.3 mV

## 2019-03-24 ENCOUNTER — Ambulatory Visit (INDEPENDENT_AMBULATORY_CARE_PROVIDER_SITE_OTHER): Payer: Medicare Other

## 2019-03-24 DIAGNOSIS — I509 Heart failure, unspecified: Secondary | ICD-10-CM | POA: Diagnosis not present

## 2019-03-24 DIAGNOSIS — Z95810 Presence of automatic (implantable) cardiac defibrillator: Secondary | ICD-10-CM | POA: Diagnosis not present

## 2019-03-25 ENCOUNTER — Other Ambulatory Visit: Payer: Self-pay | Admitting: Orthopedic Surgery

## 2019-03-25 DIAGNOSIS — M84422D Pathological fracture, left humerus, subsequent encounter for fracture with routine healing: Secondary | ICD-10-CM

## 2019-03-25 NOTE — Progress Notes (Signed)
EPIC Encounter for ICM Monitoring  Patient Name: Megan Shelton is a 78 y.o. female Date: 03/25/2019 Primary Care Physican: Mar Daring, PA-C Primary [place] 10/26/2020Weight:162lbs   Spoke with patient.  Discussed diet and encouraged to read food labels so she can add salt intake for the day and to limit to 2000 mg.  She does use salt shaker at times and encouraged to avoid the salt shaker if possible. She does not voice any fluid symptoms at this time.  She is trying to recover from broken humerus.   OptivolThoracic impedance suggesting possible ongoing fluid accumulation since 02/16/2019 which correlates with ER visit for broken humerus but impedance is improving.   Prescribed:Furosemide40 mg take 1 tablet by mouth as needed  Labs: 11/21/2018 Creatinine 0.74, BUN 12, Potassium 4.3, Sodiums 140, GFR 79-91 05/16/2018 Creatinine 0.74, BUN 24, Potassium 4.4, Sodium 140, GFR 79-91  Recommendations: Advised to take 1 PRN table of Furosemide and to decrease salt intake.  Follow-up plan: ICM clinic phone appointment on 03/30/2019 to recheck fluid levels.   91 day device clinic remote transmission 06/22/2019.    Copy of ICM check sent to Dr. Caryl Comes.   3 month ICM trend: 03/23/2019    1 Year ICM trend:       Rosalene Billings, RN 03/25/2019 11:32 AM

## 2019-03-30 ENCOUNTER — Telehealth: Payer: Self-pay | Admitting: Physician Assistant

## 2019-03-30 ENCOUNTER — Ambulatory Visit (INDEPENDENT_AMBULATORY_CARE_PROVIDER_SITE_OTHER): Payer: Medicare Other

## 2019-03-30 DIAGNOSIS — I509 Heart failure, unspecified: Secondary | ICD-10-CM

## 2019-03-30 DIAGNOSIS — Z95810 Presence of automatic (implantable) cardiac defibrillator: Secondary | ICD-10-CM

## 2019-03-30 NOTE — Telephone Encounter (Signed)
Pt has a cat scan scheduled for Wed. (ordered from [facility]) Pt saw her daughter last Wednesday and now her daughter is covid positive. Pt would like to have a covid test before this cat scan.  Advised pt she may need to reschedule cat scan, but pt did not think so and declined to call.  Pt wants to get covid test, and hopes if it is negative, she can proceed with cat scan..  No covid testing available today, unless her dr, Anderson Malta, will put in an order.    Please advise pt.

## 2019-03-30 NOTE — Telephone Encounter (Signed)
Please advise 

## 2019-03-30 NOTE — Telephone Encounter (Signed)
Per patient she is scheduled for COVID testing tomorrow at 9am and that she is going to cancel the CT scan

## 2019-03-30 NOTE — Telephone Encounter (Signed)
She may benefit more from a rapid test from urgent care as I cannot promise even if she was tested today that we would have the results by Weds.

## 2019-03-30 NOTE — Progress Notes (Signed)
EPIC Encounter for ICM Monitoring  Patient Name: Megan Shelton is a 78 y.o. female Date: 03/30/2019 Primary Care Physican: Mar Daring, PA-C Primary [place] 10/26/2020Weight:162lbs   Spoke with patient and she is doing well today.  No fluid symptoms.  OptivolThoracic impedance returned to normal after taking PRN Furosemide.    Prescribed:Furosemide40 mg take 1 tablet by mouth as needed  Labs: 11/21/2018 Creatinine 0.74, BUN 12, Potassium 4.3, Sodiums 140, GFR 79-91 05/16/2018 Creatinine 0.74, BUN 24, Potassium 4.4, Sodium 140, GFR 79-91  Recommendations:  No changes and encouraged to call if experiencing any fluid symptoms.   Follow-up plan: ICM clinic phone appointment on 05/04/2019.   91 day device clinic remote transmission 06/22/2019.    Copy of ICM check sent to Dr. Caryl Comes.    3 month ICM trend: 03/30/2019    1 Year ICM trend:       Rosalene Billings, RN 03/30/2019 4:15 PM

## 2019-03-31 ENCOUNTER — Ambulatory Visit: Payer: Medicare Other | Attending: Internal Medicine

## 2019-03-31 DIAGNOSIS — Z20822 Contact with and (suspected) exposure to covid-19: Secondary | ICD-10-CM

## 2019-04-01 ENCOUNTER — Ambulatory Visit: Payer: Medicare Other

## 2019-04-02 LAB — NOVEL CORONAVIRUS, NAA: SARS-CoV-2, NAA: NOT DETECTED

## 2019-04-14 ENCOUNTER — Other Ambulatory Visit: Payer: Self-pay

## 2019-04-14 ENCOUNTER — Encounter
Admission: RE | Admit: 2019-04-14 | Discharge: 2019-04-14 | Disposition: A | Discharge status: Home / Self Care | Payer: Medicare Other | Source: Ambulatory Visit | Attending: Orthopedic Surgery | Admitting: Orthopedic Surgery

## 2019-04-14 DIAGNOSIS — M84422D Pathological fracture, left humerus, subsequent encounter for fracture with routine healing: Secondary | ICD-10-CM | POA: Insufficient documentation

## 2019-04-14 DIAGNOSIS — M84422A Pathological fracture, left humerus, initial encounter for fracture: Secondary | ICD-10-CM | POA: Diagnosis not present

## 2019-04-14 MED ORDER — TECHNETIUM TC 99M MEDRONATE IV KIT
22.0050 | PACK | Freq: Once | INTRAVENOUS | Status: AC | PRN
  Administered 2019-04-14: 22.005 via INTRAVENOUS

## 2019-04-16 ENCOUNTER — Other Ambulatory Visit: Payer: Self-pay | Admitting: Physician Assistant

## 2019-04-16 DIAGNOSIS — C7951 Secondary malignant neoplasm of bone: Secondary | ICD-10-CM

## 2019-04-16 NOTE — Progress Notes (Signed)
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

## 2019-04-20 ENCOUNTER — Encounter: Payer: Self-pay | Admitting: Oncology

## 2019-04-20 ENCOUNTER — Encounter: Payer: Self-pay | Admitting: Radiation Oncology

## 2019-04-20 ENCOUNTER — Other Ambulatory Visit: Payer: Self-pay

## 2019-04-20 ENCOUNTER — Ambulatory Visit
Admission: RE | Admit: 2019-04-20 | Discharge: 2019-04-20 | Disposition: A | Discharge status: Home / Self Care | Payer: Medicare Other | Source: Ambulatory Visit | Attending: Oncology | Admitting: Oncology

## 2019-04-20 ENCOUNTER — Inpatient Hospital Stay: Payer: Medicare Other

## 2019-04-20 ENCOUNTER — Inpatient Hospital Stay: Payer: Medicare Other | Attending: Oncology | Admitting: Oncology

## 2019-04-20 VITALS — BP 146/65 | HR 81 | Temp 98.3°F | Wt 163.5 lb

## 2019-04-20 DIAGNOSIS — Z7982 Long term (current) use of aspirin: Secondary | ICD-10-CM | POA: Diagnosis not present

## 2019-04-20 DIAGNOSIS — C3490 Malignant neoplasm of unspecified part of unspecified bronchus or lung: Secondary | ICD-10-CM | POA: Insufficient documentation

## 2019-04-20 DIAGNOSIS — I472 Ventricular tachycardia: Secondary | ICD-10-CM | POA: Diagnosis not present

## 2019-04-20 DIAGNOSIS — Z7984 Long term (current) use of oral hypoglycemic drugs: Secondary | ICD-10-CM | POA: Diagnosis not present

## 2019-04-20 DIAGNOSIS — M1611 Unilateral primary osteoarthritis, right hip: Secondary | ICD-10-CM | POA: Insufficient documentation

## 2019-04-20 DIAGNOSIS — I2510 Atherosclerotic heart disease of native coronary artery without angina pectoris: Secondary | ICD-10-CM | POA: Insufficient documentation

## 2019-04-20 DIAGNOSIS — Z791 Long term (current) use of non-steroidal anti-inflammatories (NSAID): Secondary | ICD-10-CM | POA: Diagnosis not present

## 2019-04-20 DIAGNOSIS — I255 Ischemic cardiomyopathy: Secondary | ICD-10-CM | POA: Insufficient documentation

## 2019-04-20 DIAGNOSIS — Z79899 Other long term (current) drug therapy: Secondary | ICD-10-CM | POA: Insufficient documentation

## 2019-04-20 DIAGNOSIS — C7951 Secondary malignant neoplasm of bone: Secondary | ICD-10-CM

## 2019-04-20 DIAGNOSIS — C801 Malignant (primary) neoplasm, unspecified: Secondary | ICD-10-CM | POA: Diagnosis not present

## 2019-04-20 DIAGNOSIS — I5022 Chronic systolic (congestive) heart failure: Secondary | ICD-10-CM | POA: Diagnosis not present

## 2019-04-20 DIAGNOSIS — E785 Hyperlipidemia, unspecified: Secondary | ICD-10-CM | POA: Diagnosis not present

## 2019-04-20 DIAGNOSIS — Z7189 Other specified counseling: Secondary | ICD-10-CM

## 2019-04-20 DIAGNOSIS — Z96652 Presence of left artificial knee joint: Secondary | ICD-10-CM | POA: Insufficient documentation

## 2019-04-20 DIAGNOSIS — E669 Obesity, unspecified: Secondary | ICD-10-CM | POA: Insufficient documentation

## 2019-04-20 DIAGNOSIS — I110 Hypertensive heart disease with heart failure: Secondary | ICD-10-CM | POA: Diagnosis not present

## 2019-04-20 DIAGNOSIS — E1136 Type 2 diabetes mellitus with diabetic cataract: Secondary | ICD-10-CM | POA: Insufficient documentation

## 2019-04-20 DIAGNOSIS — G893 Neoplasm related pain (acute) (chronic): Secondary | ICD-10-CM

## 2019-04-20 DIAGNOSIS — I252 Old myocardial infarction: Secondary | ICD-10-CM | POA: Diagnosis not present

## 2019-04-20 DIAGNOSIS — M84422D Pathological fracture, left humerus, subsequent encounter for fracture with routine healing: Secondary | ICD-10-CM | POA: Diagnosis not present

## 2019-04-20 DIAGNOSIS — Z85850 Personal history of malignant neoplasm of thyroid: Secondary | ICD-10-CM | POA: Insufficient documentation

## 2019-04-20 DIAGNOSIS — Z950 Presence of cardiac pacemaker: Secondary | ICD-10-CM | POA: Insufficient documentation

## 2019-04-20 DIAGNOSIS — Z95810 Presence of automatic (implantable) cardiac defibrillator: Secondary | ICD-10-CM | POA: Insufficient documentation

## 2019-04-20 DIAGNOSIS — Z87891 Personal history of nicotine dependence: Secondary | ICD-10-CM | POA: Diagnosis not present

## 2019-04-20 LAB — CBC WITH DIFFERENTIAL/PLATELET
Abs Immature Granulocytes: 0.04 10*3/uL (ref 0.00–0.07)
Basophils Absolute: 0 10*3/uL (ref 0.0–0.1)
Basophils Relative: 0 %
Eosinophils Absolute: 0.2 10*3/uL (ref 0.0–0.5)
Eosinophils Relative: 2 %
HCT: 46.1 % — ABNORMAL HIGH (ref 36.0–46.0)
Hemoglobin: 14.6 g/dL (ref 12.0–15.0)
Immature Granulocytes: 0 %
Lymphocytes Relative: 15 %
Lymphs Abs: 1.4 10*3/uL (ref 0.7–4.0)
MCH: 26.9 pg (ref 26.0–34.0)
MCHC: 31.7 g/dL (ref 30.0–36.0)
MCV: 85.1 fL (ref 80.0–100.0)
Monocytes Absolute: 0.7 10*3/uL (ref 0.1–1.0)
Monocytes Relative: 8 %
Neutro Abs: 7.2 10*3/uL (ref 1.7–7.7)
Neutrophils Relative %: 75 %
Platelets: 249 10*3/uL (ref 150–400)
RBC: 5.42 MIL/uL — ABNORMAL HIGH (ref 3.87–5.11)
RDW: 14.5 % (ref 11.5–15.5)
WBC: 9.6 10*3/uL (ref 4.0–10.5)
nRBC: 0 % (ref 0.0–0.2)

## 2019-04-20 LAB — LACTATE DEHYDROGENASE: LDH: 182 U/L (ref 98–192)

## 2019-04-20 LAB — COMPREHENSIVE METABOLIC PANEL WITH GFR
ALT: 16 U/L (ref 0–44)
AST: 18 U/L (ref 15–41)
Albumin: 4.1 g/dL (ref 3.5–5.0)
Alkaline Phosphatase: 184 U/L — ABNORMAL HIGH (ref 38–126)
Anion gap: 12 (ref 5–15)
BUN: 13 mg/dL (ref 8–23)
CO2: 23 mmol/L (ref 22–32)
Calcium: 9.1 mg/dL (ref 8.9–10.3)
Chloride: 102 mmol/L (ref 98–111)
Creatinine, Ser: 0.4 mg/dL — ABNORMAL LOW (ref 0.44–1.00)
GFR calc Af Amer: >60 mL/min
GFR calc non Af Amer: >60 mL/min
Glucose, Bld: 167 mg/dL — ABNORMAL HIGH (ref 70–99)
Potassium: 3.6 mmol/L (ref 3.5–5.1)
Sodium: 137 mmol/L (ref 135–145)
Total Bilirubin: 0.6 mg/dL (ref 0.3–1.2)
Total Protein: 7.4 g/dL (ref 6.5–8.1)

## 2019-04-20 MED ORDER — OXYCODONE HCL 5 MG PO TABS: 5.0000 mg | ORAL_TABLET | ORAL | 0 refills | Status: DC | PRN

## 2019-04-20 NOTE — Progress Notes (Signed)
Patient is here today to establish care for bone cancer.

## 2019-04-20 NOTE — Progress Notes (Signed)
Hematology/Oncology Consult note [facility] [phone] [phone]  Patient Care Team: Rubye Beach as PCP - General (Family Medicine) Deboraha Sprang, MD as PCP - Cardiology (Cardiology)   Name of the patient: Megan Shelton  [id number]  11-17-1940    Reason for referral-bone metastases   Referring physician- Fenton Malling PA   Date of visit: 04/20/19   History of presenting illness-patient is a 79 year old female with a past medical history significant for congestive heart failure with AICD placement, history of thyroid cancer s/p thyroidectomy.  She was also a chronic smoker but quit smoking in 2006. More recently patient presented with pain in her left arm which led to an x-ray.  X-ray showed comminuted possibly pathologic midshaft humeral fracture.  This was followed by a whole-body bone scan which showed multiple sites of abnormal tracer uptake involving left scapula, bilateral posterior ribs, thoracic and lumbar spine, pelvis and multiple sites in the proximal and distal left femur as well as left humeral diaphysis.  Patient has been referred to [place] for further management  Patient has a cast in place for the last 6 weeks but continues to have pain in that area.  She lives with her son and is independent of her ADLs and IADLs.  ECOG PS- 1  Pain scale- 3   Review of systems- Review of Systems  Constitutional: Negative for chills, fever, malaise/fatigue and weight loss.  HENT: Negative for congestion, ear discharge and nosebleeds.   Eyes: Negative for blurred vision.  Respiratory: Negative for cough, hemoptysis, sputum production, shortness of breath and wheezing.   Cardiovascular: Negative for chest pain, palpitations, orthopnea and claudication.  Gastrointestinal: Negative for abdominal pain, blood in stool, constipation, diarrhea, heartburn, melena, nausea and vomiting.  Genitourinary: Negative for dysuria, flank  pain, frequency, hematuria and urgency.  Musculoskeletal: Negative for back pain, joint pain and myalgias.       Left arm pain  Skin: Negative for rash.  Neurological: Negative for dizziness, tingling, focal weakness, seizures, weakness and headaches.  Endo/Heme/Allergies: Does not bruise/bleed easily.  Psychiatric/Behavioral: Negative for depression and suicidal ideas. The patient does not have insomnia.     Allergies  Allergen Reactions  . Latex   . Nickel     Patient Active Problem List   Diagnosis Date Noted  . Cardiac defibrillator in place 04/20/2019  . Eyelid dermatitis, allergic/contact- bilateral  02/25/2019  . Closed displaced transverse fracture of shaft of left humerus 02/20/2019  . Coronary artery disease involving native coronary artery of native heart without angina pectoris 01/15/2019  . Chronic HFrEF (heart failure with reduced ejection fraction) ([place]) 01/15/2019  . Ischemic cardiomyopathy 01/15/2019  . Hyperlipidemia LDL goal <70 01/15/2019  . GAD (generalized anxiety disorder) 05/16/2018  . Allergic rhinitis 05/03/2017  . Arthritis 05/03/2017  . Cataract 05/03/2017  . Congestive heart failure ([place]) 05/03/2017  . Diabetes mellitus with neuropathy ([place]) 05/03/2017  . Essential hypertension 05/03/2017  . History of heart attack 05/03/2017  . H/O thyroidectomy 05/03/2017  . H/O total knee replacement 05/03/2017     Past Medical History:  Diagnosis Date  . Diabetes mellitus with neuropathy ([place])   . Hypertensive heart disease   . Implantable cardioverter-defibrillator (ICD)-[employer]   . Myocardial infarction, anterior wall ([place])   . Obesity, Class II, BMI 35-39.9      Past Surgical History:  Procedure Laterality Date  . CARPAL TUNNEL RELEASE    . EYE SURGERY      Social History  Socioeconomic History  . Marital status: Widowed    Spouse name: Not on file  . Number of children: 3  . Years of education: Not on file  . Highest education level:  High school graduate  Occupational History  . Occupation: retired  Tobacco Use  . Smoking status: Former Smoker    Packs/day: 1.00    Years: 55.00    Pack years: 55.00    Types: Cigarettes    Quit date: 2006    Years since quitting: 15.0  . Smokeless tobacco: Never Used  . Tobacco comment: quit about 18 years ago; as of 2019  Substance and Sexual Activity  . Alcohol use: No  . Drug use: No  . Sexual activity: Not on file  Other Topics Concern  . Not on file  Social History Narrative  . Not on file   Social Determinants of Health   Financial Resource Strain:   . Difficulty of Paying Living Expenses: Not on file  Food Insecurity:   . Worried About Charity fundraiser in the Last Year: Not on file  . Ran Out of Food in the Last Year: Not on file  Transportation Needs:   . Lack of Transportation (Medical): Not on file  . Lack of Transportation (Non-Medical): Not on file  Physical Activity: Inactive  . Days of Exercise per Week: 0 days  . Minutes of Exercise per Session: 0 min  Stress: No Stress Concern Present  . Feeling of Stress : Not at all  Social Connections: Unknown  . Frequency of Communication with Friends and Family: Patient refused  . Frequency of Social Gatherings with Friends and Family: Patient refused  . Attends Religious Services: Patient refused  . Active Member of Clubs or Organizations: Patient refused  . Attends Archivist Meetings: Patient refused  . Marital Status: Patient refused  Intimate Partner Violence: Unknown  . Fear of Current or Ex-Partner: Patient refused  . Emotionally Abused: Patient refused  . Physically Abused: Patient refused  . Sexually Abused: Patient refused     Family History  Problem Relation Age of Onset  . Rashes / Skin problems Mother   . Cancer Father 67       colon cancer  . Heart disease Brother        died of heart attack  . Heart disease Brother      Current Outpatient Medications:  .  aspirin 81 MG  tablet, Take 81 mg by mouth daily., Disp: , Rfl:  .  atorvastatin (LIPITOR) 40 MG tablet, TAKE 1 TABLET BY MOUTH  DAILY, Disp: 90 tablet, Rfl: 3 .  Blood Glucose Monitoring Suppl (ACCU-CHEK COMPACT CARE KIT) KIT, To check blood sugar daily., Disp: 1 kit, Rfl: 0 .  Blood Glucose Monitoring Suppl (ACCU-CHEK GUIDE) w/Device KIT, , Disp: , Rfl:  .  carvedilol (COREG) 25 MG tablet, Take 1 tablet by mouth 2 (two) times daily with a meal., Disp: , Rfl:  .  Choline Fenofibrate 45 MG capsule, Take 1 capsule by mouth 1 day or 1 dose., Disp: , Rfl:  .  clonazePAM (KLONOPIN) 1 MG tablet, TAKE 1 TABLET BY MOUTH 3 TIMES A DAY AS NEEDED FOR ANXIETY, Disp: 90 tablet, Rfl: 5 .  Coenzyme Q10 (COQ10 PO), Take 1 capsule by mouth daily., Disp: , Rfl:  .  erythromycin ophthalmic ointment, Place 1 application into both eyes 4 (four) times daily. ( 0.5 % erythromycin ointment).Apply to both affected eyelids., Disp: 3.5 g, Rfl: 0 .  fluticasone (FLONASE) 50 MCG/ACT nasal spray, Place 1 spray into the nose 2 (two) times daily., Disp: , Rfl:  .  furosemide (LASIX) 40 MG tablet, TAKE 1 TABLET BY MOUTH EVERY DAY AS NEEDED, Disp: 90 tablet, Rfl: 1 .  glimepiride (AMARYL) 1 MG tablet, TAKE 1 TABLET BY MOUTH TWO  TIMES DAILY, Disp: 180 tablet, Rfl: 3 .  glucose blood (ACCU-CHEK COMPACT PLUS) test strip, To check blood sugar daily., Disp: 100 each, Rfl: 12 .  hydrocortisone 1 % ointment, Apply 1 application topically 2 (two) times daily. May use for skin irritation on bilateral eyelids. Do not get inside of eyes., Disp: 30 g, Rfl: 0 .  ibuprofen (ADVIL) 800 MG tablet, Take 1 tablet by mouth as needed., Disp: , Rfl:  .  Lancets (ACCU-CHEK SOFT TOUCH) lancets, To check blood sugar daily., Disp: 100 each, Rfl: 12 .  levothyroxine (SYNTHROID) 112 MCG tablet, TAKE 1 TABLET BY MOUTH  DAILY BEFORE BREAKFAST, Disp: 90 tablet, Rfl: 3 .  lisinopril (ZESTRIL) 10 MG tablet, TAKE 1 TABLET BY MOUTH  DAILY, Disp: 90 tablet, Rfl: 3 .  metFORMIN  (GLUCOPHAGE-XR) 500 MG 24 hr tablet, TAKE 1 TABLET BY MOUTH IN  THE MORNING AND 2 TABLETS  IN THE EVENING (Patient taking differently: Take 500 mg by mouth 2 (two) times daily. ), Disp: 270 tablet, Rfl: 3 .  metoprolol tartrate (LOPRESSOR) 25 MG tablet, TAKE 1 TABLET BY MOUTH TWO  TIMES DAILY, Disp: 180 tablet, Rfl: 3 .  ondansetron (ZOFRAN ODT) 4 MG disintegrating tablet, Take 1 tablet (4 mg total) by mouth every 8 (eight) hours as needed for nausea or vomiting., Disp: 20 tablet, Rfl: 0 .  ONETOUCH DELICA LANCETS 94T MISC, CHECK BLOOD SUGAR ONCE  DAILY, Disp: 100 each, Rfl: 11 .  oxyCODONE (OXY IR/ROXICODONE) 5 MG immediate release tablet, Take 5 mg by mouth every 4 (four) hours as needed., Disp: , Rfl:  .  oxyCODONE-acetaminophen (PERCOCET) 5-325 MG tablet, Take 1 tablet by mouth every 6 (six) hours as needed for severe pain., Disp: 15 tablet, Rfl: 0 .  pyridOXINE (VITAMIN B-6) 100 MG tablet, Take 100 mg by mouth daily., Disp: , Rfl:  .  sitaGLIPtin (JANUVIA) 100 MG tablet, Take 100 mg by mouth daily., Disp: , Rfl:  .  vitamin B-12 (CYANOCOBALAMIN) 100 MCG tablet, Take 100 mcg by mouth daily., Disp: , Rfl:  .  oxyCODONE (OXY IR/ROXICODONE) 5 MG immediate release tablet, Take 1 tablet (5 mg total) by mouth every 4 (four) hours as needed for severe pain., Disp: 60 tablet, Rfl: 0   Physical exam:  Vitals:   04/20/19 0957  BP: (!) 146/65  Pulse: 81  Temp: 98.3 F (36.8 C)  TempSrc: Tympanic  SpO2: 98%  Weight: 163 lb 8 oz (74.2 kg)   Physical Exam Constitutional:      General: She is not in acute distress. HENT:     Head: Normocephalic and atraumatic.  Eyes:     Pupils: Pupils are equal, round, and reactive to light.  Cardiovascular:     Rate and Rhythm: Normal rate and regular rhythm.     Heart sounds: Normal heart sounds.  Pulmonary:     Effort: Pulmonary effort is normal.     Breath sounds: Normal breath sounds.  Abdominal:     General: Bowel sounds are normal.     Palpations:  Abdomen is soft.  Musculoskeletal:     Cervical back: Normal range of motion.     Comments: Cast  in place over the left arm  Skin:    General: Skin is warm and dry.  Neurological:     Mental Status: She is alert and oriented to person, place, and time.        CMP Latest Ref Rng & Units 04/20/2019  Glucose 70 - 99 mg/dL 167(H)  BUN 8 - 23 mg/dL 13  Creatinine 0.44 - 1.00 mg/dL 0.40(L)  Sodium 135 - 145 mmol/L 137  Potassium 3.5 - 5.1 mmol/L 3.6  Chloride 98 - 111 mmol/L 102  CO2 22 - 32 mmol/L 23  Calcium 8.9 - 10.3 mg/dL 9.1  Total Protein 6.5 - 8.1 g/dL 7.4  Total Bilirubin 0.3 - 1.2 mg/dL 0.6  Alkaline Phos 38 - 126 U/L 184(H)  AST 15 - 41 U/L 18  ALT 0 - 44 U/L 16   CBC Latest Ref Rng & Units 04/20/2019  WBC 4.0 - 10.5 K/uL 9.6  Hemoglobin 12.0 - 15.0 g/dL 14.6  Hematocrit 36.0 - 46.0 % 46.1(H)  Platelets 150 - 400 K/uL 249    No images are attached to the encounter.  NM Bone Scan Whole Body  Result Date: 04/15/2019 CLINICAL DATA:  Pathologic fracture LEFT humerus, follow-up EXAM: NUCLEAR MEDICINE WHOLE BODY BONE SCAN TECHNIQUE: Whole body anterior and posterior images were obtained approximately 3 hours after intravenous injection of radiopharmaceutical. RADIOPHARMACEUTICALS:  22.005 mCi Technetium-28mMDP IV COMPARISON:  None Radiographic correlation: LEFT humeral radiographs 02/14/2019 FINDINGS: Multiple sites of abnormal osseous tracer uptake are identified consistent with osseous metastatic disease. These include proximal humeri, tip of LEFT scapula, BILATERAL posterior ribs, thoracic and lumbar spine, pelvis, and multiple sites in the proximal and distal LEFT femur. Intense uptake at mid LEFT humeral diaphysis corresponding to known pathologic fracture. Degenerative type uptake at RIGHT knee with note of a LEFT knee prosthesis. Photopenic region at the upper anterior LEFT chest corresponding to pacemaker generator. Expected urinary tract and soft tissue distribution of  tracer. IMPRESSION: Multiple sites of abnormal tracer uptake consistent with osseous metastatic disease. These include sites within the humeri bilaterally and within the LEFT femur; radiographs of the LEFT femur and RIGHT humerus recommended to exclude metastatic lesions at risk for pathologic fracture. Electronically Signed   By: MLavonia DanaM.D.   On: 04/15/2019 08:34   DG Humerus Right  Result Date: 04/20/2019 CLINICAL DATA:  Osseous metastatic disease. EXAM: RIGHT HUMERUS - 2+ VIEW COMPARISON:  Bone scan dated April 14, 2019. FINDINGS: 1.7 cm lucent lesion in the proximal humeral diaphysis. No additional focal bone lesion. No acute fracture or dislocation. Mild degenerative changes of the shoulder and elbow. IMPRESSION: 1. 1.7 cm lucent lesion in the proximal humeral diaphysis corresponding to the area of abnormal uptake on recent bone scan, consistent with osseous metastatic disease. Electronically Signed   By: WTitus DubinM.D.   On: 04/20/2019 15:54   CUP PACEART REMOTE DEVICE CHECK  Result Date: 03/23/2019 Scheduled remote reviewed.  Normal device function.  1 NSVT episode Activity level down, optivol elevated Next remote 91 days.  DG HIP UNILAT WITH PELVIS 2-3 VIEWS LEFT  Result Date: 04/20/2019 CLINICAL DATA:  Osseous metastatic disease. EXAM: LEFT FEMUR 2 VIEWS; DG HIP (WITH OR WITHOUT PELVIS) 2-3V LEFT COMPARISON:  Bone scan dated April 14, 2019. FINDINGS: Ill-defined 2.9 cm lucent lesion in the left superior acetabulum. 1.5 cm lucent lesion in the distal left femoral metadiaphysis. No discrete lesion identified in the left proximal femur. Mild right hip osteoarthritis. Prior left total knee arthroplasty.  Calcified fibroid in the pelvis. Vascular calcifications. IMPRESSION: 1. Lucent lesions in the left superior acetabulum and distal left femoral metadiaphysis corresponding to areas of abnormal uptake on recent bone scan, consistent with osseous metastatic disease. 2. No discrete lesion  identified in the left proximal femur at the site of abnormal uptake seen on recent both scan. Electronically Signed   By: Titus Dubin M.D.   On: 04/20/2019 15:59   DG FEMUR MIN 2 VIEWS LEFT  Result Date: 04/20/2019 CLINICAL DATA:  Osseous metastatic disease. EXAM: LEFT FEMUR 2 VIEWS; DG HIP (WITH OR WITHOUT PELVIS) 2-3V LEFT COMPARISON:  Bone scan dated April 14, 2019. FINDINGS: Ill-defined 2.9 cm lucent lesion in the left superior acetabulum. 1.5 cm lucent lesion in the distal left femoral metadiaphysis. No discrete lesion identified in the left proximal femur. Mild right hip osteoarthritis. Prior left total knee arthroplasty. Calcified fibroid in the pelvis. Vascular calcifications. IMPRESSION: 1. Lucent lesions in the left superior acetabulum and distal left femoral metadiaphysis corresponding to areas of abnormal uptake on recent bone scan, consistent with osseous metastatic disease. 2. No discrete lesion identified in the left proximal femur at the site of abnormal uptake seen on recent both scan. Electronically Signed   By: Titus Dubin M.D.   On: 04/20/2019 15:59    Assessment and plan- Patient is a 79 y.o. 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  Primary unclear  I have personally reviewed bone scan images and CT images independently and discussed findings with the patient.  She does have extensive bony metastatic disease and the primary is presently unclear.  She does have a significant history of smoking in the past and therefore lung cancer remains high on the differential.  She also has a prior history of thyroid cancer.  Labs are not clearly suggestive of myeloma but I would like to rule out that as well and obtain an myeloma panel as well as serum free light chains today.  I would like to get a PET CT scan to get a complete staging work-up to a certain primary and that would facilitate further biopsy as well.  I would like to hold off on a bone biopsy at this  time.  We will obtain MRI brain with and without contrast to complete staging work-up.  I will obtain plain radiographs of the right humerus as well as left pelvis and left femur to see if there are any other potential areas that would be at risk for fracture.  Patient would benefit from palliative radiation treatment to her left humerus and I will refer her to radiation oncology for the same.  Patient is meeting with orthopedics today and it remains to be seen if there would be a role for surgical fixation if the fracture has not healed adequately.  Also discussed the role for Xgeva to reduce the chances of further skeletal metastases.  Discussed risks and benefits of Xgeva including all but not limited to fatigue, hypocalcemia and possible risk of osteonecrosis of the jaw.  We will get in touch with her dentist to obtain dental clearance prior to starting Xgeva.  I will tentatively see the patient for a video visit after PET CT scan to discuss biopsy plans and further management.  Discussed that bone scan findings are suggestive of stage IV disease and therefore treatment would be palliative and not curative.  What type of treatment she needs will depend on the source of primary and biopsy results.  Neoplasm related pain: Patient  seems to be in significant pain in her left arm and I will therefore start her on oxycodone 5 mg every 4 hours as needed for pain.  She will need to take MiraLAX and senna to avoid opioid-induced constipation   Total face to face encounter time for this patient visit was 40 min. >50% of the time was  spent in counseling and coordination of care.    Thank you for this kind referral and the opportunity to participate in the care of this patient   Visit Diagnosis 1. Bone metastases ([place])   2. Goals of care, counseling/discussion   3. Neoplasm related pain     Dr. Randa Evens, MD, MPH [facility] [id number] 04/20/2019  4:14  PM

## 2019-04-21 ENCOUNTER — Other Ambulatory Visit: Payer: Self-pay

## 2019-04-21 ENCOUNTER — Encounter: Payer: Self-pay | Admitting: Radiation Oncology

## 2019-04-21 ENCOUNTER — Ambulatory Visit
Admission: RE | Admit: 2019-04-21 | Discharge: 2019-04-21 | Disposition: A | Discharge status: Home / Self Care | Payer: Medicare Other | Source: Ambulatory Visit | Attending: Radiation Oncology | Admitting: Radiation Oncology

## 2019-04-21 VITALS — BP 131/62 | HR 84 | Resp 18 | Wt 161.6 lb

## 2019-04-21 DIAGNOSIS — I252 Old myocardial infarction: Secondary | ICD-10-CM | POA: Insufficient documentation

## 2019-04-21 DIAGNOSIS — Z800 Family history of malignant neoplasm of digestive organs: Secondary | ICD-10-CM | POA: Insufficient documentation

## 2019-04-21 DIAGNOSIS — E119 Type 2 diabetes mellitus without complications: Secondary | ICD-10-CM | POA: Diagnosis not present

## 2019-04-21 DIAGNOSIS — Z7982 Long term (current) use of aspirin: Secondary | ICD-10-CM | POA: Diagnosis not present

## 2019-04-21 DIAGNOSIS — E669 Obesity, unspecified: Secondary | ICD-10-CM | POA: Diagnosis not present

## 2019-04-21 DIAGNOSIS — C7951 Secondary malignant neoplasm of bone: Secondary | ICD-10-CM | POA: Insufficient documentation

## 2019-04-21 DIAGNOSIS — Z79899 Other long term (current) drug therapy: Secondary | ICD-10-CM | POA: Diagnosis not present

## 2019-04-21 DIAGNOSIS — I129 Hypertensive chronic kidney disease with stage 1 through stage 4 chronic kidney disease, or unspecified chronic kidney disease: Secondary | ICD-10-CM | POA: Diagnosis not present

## 2019-04-21 DIAGNOSIS — C801 Malignant (primary) neoplasm, unspecified: Secondary | ICD-10-CM | POA: Insufficient documentation

## 2019-04-21 DIAGNOSIS — Z87891 Personal history of nicotine dependence: Secondary | ICD-10-CM | POA: Insufficient documentation

## 2019-04-21 LAB — KAPPA/LAMBDA LIGHT CHAINS
Kappa free light chain: 15.4 mg/L (ref 3.3–19.4)
Kappa, lambda light chain ratio: 1.27 (ref 0.26–1.65)
Lambda free light chains: 12.1 mg/L (ref 5.7–26.3)

## 2019-04-21 NOTE — Consult Note (Signed)
NEW PATIENT EVALUATION  Name: Megan Shelton  MRN: [id number]  Date:   04/21/2019     DOB: 05/21/1940   This 79 y.o. 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  REFERRING PHYSICIAN: Mar Daring, [place]*  CHIEF COMPLAINT:  Chief Complaint  Patient presents with  . Cancer    Initial consultation     DIAGNOSIS: The encounter diagnosis was Bone metastasis ([place]).   PREVIOUS INVESTIGATIONS:  Bone scan and plain films reviewed Clinical notes reviewed  HPI: Patient is a 79 year old female who presented with a pathologic fracture of her left humerus in the midshaft.  She had a bone scan which showed widespread area of increased uptake consistent with widespread metastasis involving her left scapula bilateral posterior ribs thoracic lumbar spine pelvis as well as bilateral hips.  She states her real pain is in her left humerus.  It did have a cast on it which has been removed repeat films were of the opposite humerus which did not show any evidence of fracture.  She is scheduled for a PET CT scan as well as area for possible biopsy to identify primary tumor.  She is seen today for consideration of palliative radiation therapy to her left upper extremity.  She does have limited movement in the arm is narcotic analgesics for pain.  She states her left hip is somewhat tender although not significant Lee painful  PLANNED TREATMENT REGIMEN: Palliative radiation therapy to her left humerus  PAST MEDICAL HISTORY:  has a past medical history of Diabetes mellitus with neuropathy ([place]), Hypertensive heart disease, Implantable cardioverter-defibrillator (ICD)-[employer], Myocardial infarction, anterior wall ([place]), and Obesity, Class II, BMI 35-39.9.    PAST SURGICAL HISTORY:  Past Surgical History:  Procedure Laterality Date  . CARPAL TUNNEL RELEASE    .  EYE SURGERY      FAMILY HISTORY: family history includes Cancer (age of onset: 49) in her father; Heart disease in her brother and brother; Rashes / Skin problems in her mother.  SOCIAL HISTORY:  reports that she quit smoking about 15 years ago. Her smoking use included cigarettes. She has a 55.00 pack-year smoking history. She has never used smokeless tobacco. She reports that she does not drink alcohol or use drugs.  ALLERGIES: Latex and Nickel  MEDICATIONS:  Current Outpatient Medications  Medication Sig Dispense Refill  . aspirin 81 MG tablet Take 81 mg by mouth daily.    Marland Kitchen atorvastatin (LIPITOR) 40 MG tablet TAKE 1 TABLET BY MOUTH  DAILY 90 tablet 3  . Blood Glucose Monitoring Suppl (ACCU-CHEK COMPACT CARE KIT) KIT To check blood sugar daily. 1 kit 0  . Blood Glucose Monitoring Suppl (ACCU-CHEK GUIDE) w/Device KIT     . carvedilol (COREG) 25 MG tablet Take 1 tablet by mouth 2 (two) times daily with a meal.    . Choline Fenofibrate 45 MG capsule Take 1 capsule by mouth 1 day or 1 dose.    . clonazePAM (KLONOPIN) 1 MG tablet TAKE 1 TABLET BY MOUTH 3 TIMES A DAY AS NEEDED FOR ANXIETY 90 tablet 5  . Coenzyme Q10 (COQ10 PO) Take 1 capsule by mouth daily.    Marland Kitchen erythromycin ophthalmic ointment Place 1 application into both eyes 4 (four) times daily. ( 0.5 % erythromycin ointment).Apply to both affected eyelids. 3.5 g 0  . fluticasone (FLONASE) 50 MCG/ACT nasal spray Place 1 spray into the  nose 2 (two) times daily.    . furosemide (LASIX) 40 MG tablet TAKE 1 TABLET BY MOUTH EVERY DAY AS NEEDED 90 tablet 1  . glimepiride (AMARYL) 1 MG tablet TAKE 1 TABLET BY MOUTH TWO  TIMES DAILY 180 tablet 3  . glucose blood (ACCU-CHEK COMPACT PLUS) test strip To check blood sugar daily. 100 each 12  . hydrocortisone 1 % ointment Apply 1 application topically 2 (two) times daily. May use for skin irritation on bilateral eyelids. Do not get inside of eyes. 30 g 0  . ibuprofen (ADVIL) 800 MG tablet Take 1  tablet by mouth as needed.    . Lancets (ACCU-CHEK SOFT TOUCH) lancets To check blood sugar daily. 100 each 12  . levothyroxine (SYNTHROID) 112 MCG tablet TAKE 1 TABLET BY MOUTH  DAILY BEFORE BREAKFAST 90 tablet 3  . lisinopril (ZESTRIL) 10 MG tablet TAKE 1 TABLET BY MOUTH  DAILY 90 tablet 3  . metFORMIN (GLUCOPHAGE-XR) 500 MG 24 hr tablet TAKE 1 TABLET BY MOUTH IN  THE MORNING AND 2 TABLETS  IN THE EVENING (Patient taking differently: Take 500 mg by mouth 2 (two) times daily. ) 270 tablet 3  . metoprolol tartrate (LOPRESSOR) 25 MG tablet TAKE 1 TABLET BY MOUTH TWO  TIMES DAILY 180 tablet 3  . ondansetron (ZOFRAN ODT) 4 MG disintegrating tablet Take 1 tablet (4 mg total) by mouth every 8 (eight) hours as needed for nausea or vomiting. 20 tablet 0  . ONETOUCH DELICA LANCETS 17P MISC CHECK BLOOD SUGAR ONCE  DAILY 100 each 11  . oxyCODONE (OXY IR/ROXICODONE) 5 MG immediate release tablet Take 5 mg by mouth every 4 (four) hours as needed.    Marland Kitchen oxyCODONE (OXY IR/ROXICODONE) 5 MG immediate release tablet Take 1 tablet (5 mg total) by mouth every 4 (four) hours as needed for severe pain. 60 tablet 0  . oxyCODONE-acetaminophen (PERCOCET) 5-325 MG tablet Take 1 tablet by mouth every 6 (six) hours as needed for severe pain. 15 tablet 0  . pyridOXINE (VITAMIN B-6) 100 MG tablet Take 100 mg by mouth daily.    . sitaGLIPtin (JANUVIA) 100 MG tablet Take 100 mg by mouth daily.    . vitamin B-12 (CYANOCOBALAMIN) 100 MCG tablet Take 100 mcg by mouth daily.     No current facility-administered medications for this encounter.    ECOG PERFORMANCE STATUS:  1 - Symptomatic but completely ambulatory  REVIEW OF SYSTEMS: Patient denies any weight loss, fatigue, weakness, fever, chills or night sweats. Patient denies any loss of vision, blurred vision. Patient denies any ringing  of the ears or hearing loss. No irregular heartbeat. Patient denies heart murmur or history of fainting. Patient denies any chest pain or pain  radiating to her upper extremities. Patient denies any shortness of breath, difficulty breathing at night, cough or hemoptysis. Patient denies any swelling in the lower legs. Patient denies any nausea vomiting, vomiting of blood, or coffee ground material in the vomitus. Patient denies any stomach pain. Patient states has had normal bowel movements no significant constipation or diarrhea. Patient denies any dysuria, hematuria or significant nocturia. Patient denies any problems walking, swelling in the joints or loss of balance. Patient denies any skin changes, loss of hair or loss of weight. Patient denies any excessive worrying or anxiety or significant depression. Patient denies any problems with insomnia. Patient denies excessive thirst, polyuria, polydipsia. Patient denies any swollen glands, patient denies easy bruising or easy bleeding. Patient denies any recent infections, allergies or  URI. Patient "s visual fields have not changed significantly in recent time.   PHYSICAL EXAM: BP 131/62 (BP Location: Left Arm, Patient Position: Sitting)   Pulse 84   Resp 18   Wt 161 lb 9.6 oz (73.3 kg)   BMI 31.56 kg/m  Range of motion her lower extremities does not elicit pain.  Motor or sensory and DTR levels are equal symmetric in upper lower extremities.  Well-developed well-nourished patient in NAD. HEENT reveals PERLA, EOMI, discs not visualized.  Oral cavity is clear. No oral mucosal lesions are identified. Neck is clear without evidence of cervical or supraclavicular adenopathy. Lungs are clear to A&P. Cardiac examination is essentially unremarkable with regular rate and rhythm without murmur rub or thrill. Abdomen is benign with no organomegaly or masses noted. Motor sensory and DTR levels are equal and symmetric in the upper and lower extremities. Cranial nerves II through XII are grossly intact. Proprioception is intact. No peripheral adenopathy or edema is identified. No motor or sensory levels are  noted. Crude visual fields are within normal range.  LABORATORY DATA: Labs reviewed    RADIOLOGY RESULTS: Bone scan and plain films reviewed compatible with above-stated findings   IMPRESSION: Widespread osseous metastatic disease in patient of unknown primary and 79 year old female  PLAN: At this time of [place] and go ahead with palliative radiation therapy treatment planning.  I will plan on delivering 3000 cGy in 10 fractions to her left humerus.  Risks and benefits of treatment occluding fatigue skin reaction were discussed in detail with the patient and her son.  I will await her PET scan as well as identification of primary tumor before commencing with treatment hopefully that we accomplished before the end of next week when we would plan on starting her treatments.  Both patient and her son both seem to comprehend my treatment plan well.  I would like to take this opportunity to thank you for allowing me to participate in the care of your patient.Noreene Filbert, MD

## 2019-04-22 ENCOUNTER — Other Ambulatory Visit: Payer: Self-pay

## 2019-04-23 ENCOUNTER — Ambulatory Visit
Admission: RE | Admit: 2019-04-23 | Discharge: 2019-04-23 | Disposition: A | Discharge status: Home / Self Care | Payer: Medicare Other | Source: Ambulatory Visit | Attending: Radiation Oncology | Admitting: Radiation Oncology

## 2019-04-23 ENCOUNTER — Telehealth: Payer: Self-pay | Admitting: *Deleted

## 2019-04-23 ENCOUNTER — Other Ambulatory Visit: Payer: Self-pay

## 2019-04-23 ENCOUNTER — Other Ambulatory Visit: Payer: Self-pay | Admitting: *Deleted

## 2019-04-23 DIAGNOSIS — C7951 Secondary malignant neoplasm of bone: Secondary | ICD-10-CM | POA: Diagnosis not present

## 2019-04-23 DIAGNOSIS — C801 Malignant (primary) neoplasm, unspecified: Secondary | ICD-10-CM | POA: Diagnosis not present

## 2019-04-23 DIAGNOSIS — Z510 Encounter for antineoplastic radiation therapy: Secondary | ICD-10-CM | POA: Diagnosis not present

## 2019-04-23 LAB — MULTIPLE MYELOMA PANEL, SERUM
Albumin SerPl Elph-Mcnc: 3.8 g/dL (ref 2.9–4.4)
Albumin/Glob SerPl: 1.5 (ref 0.7–1.7)
Alpha 1: 0.2 g/dL (ref 0.0–0.4)
Alpha2 Glob SerPl Elph-Mcnc: 0.8 g/dL (ref 0.4–1.0)
B-Globulin SerPl Elph-Mcnc: 1 g/dL (ref 0.7–1.3)
Gamma Glob SerPl Elph-Mcnc: 0.7 g/dL (ref 0.4–1.8)
Globulin, Total: 2.7 g/dL (ref 2.2–3.9)
IgA: 111 mg/dL (ref 64–422)
IgG (Immunoglobin G), Serum: 813 mg/dL (ref 586–1602)
IgM (Immunoglobulin M), Srm: 87 mg/dL (ref 26–217)
Total Protein ELP: 6.5 g/dL (ref 6.0–8.5)

## 2019-04-23 NOTE — Telephone Encounter (Signed)
Patient's daughter law called to ask how her mother in law tolerated her procedure. She would like a call back.

## 2019-04-25 NOTE — Progress Notes (Signed)
ICD remote 

## 2019-04-27 ENCOUNTER — Ambulatory Visit
Admission: RE | Admit: 2019-04-27 | Discharge: 2019-04-27 | Disposition: A | Discharge status: Home / Self Care | Payer: Medicare Other | Source: Ambulatory Visit | Attending: Oncology | Admitting: Oncology

## 2019-04-27 ENCOUNTER — Other Ambulatory Visit: Payer: Self-pay | Admitting: *Deleted

## 2019-04-27 ENCOUNTER — Other Ambulatory Visit: Payer: Self-pay

## 2019-04-27 DIAGNOSIS — Z950 Presence of cardiac pacemaker: Secondary | ICD-10-CM | POA: Insufficient documentation

## 2019-04-27 DIAGNOSIS — I700 Atherosclerosis of aorta: Secondary | ICD-10-CM | POA: Diagnosis not present

## 2019-04-27 DIAGNOSIS — D259 Leiomyoma of uterus, unspecified: Secondary | ICD-10-CM | POA: Insufficient documentation

## 2019-04-27 DIAGNOSIS — C7951 Secondary malignant neoplasm of bone: Secondary | ICD-10-CM

## 2019-04-27 DIAGNOSIS — K5730 Diverticulosis of large intestine without perforation or abscess without bleeding: Secondary | ICD-10-CM | POA: Insufficient documentation

## 2019-04-27 DIAGNOSIS — R942 Abnormal results of pulmonary function studies: Secondary | ICD-10-CM

## 2019-04-27 DIAGNOSIS — Z8673 Personal history of transient ischemic attack (TIA), and cerebral infarction without residual deficits: Secondary | ICD-10-CM | POA: Diagnosis not present

## 2019-04-27 DIAGNOSIS — R918 Other nonspecific abnormal finding of lung field: Secondary | ICD-10-CM | POA: Diagnosis not present

## 2019-04-27 DIAGNOSIS — I2510 Atherosclerotic heart disease of native coronary artery without angina pectoris: Secondary | ICD-10-CM | POA: Insufficient documentation

## 2019-04-27 DIAGNOSIS — M84422A Pathological fracture, left humerus, initial encounter for fracture: Secondary | ICD-10-CM | POA: Diagnosis not present

## 2019-04-27 DIAGNOSIS — C801 Malignant (primary) neoplasm, unspecified: Secondary | ICD-10-CM | POA: Diagnosis not present

## 2019-04-27 LAB — GLUCOSE, CAPILLARY: Glucose-Capillary: 164 mg/dL — ABNORMAL HIGH (ref 70–99)

## 2019-04-27 MED ORDER — FLUDEOXYGLUCOSE F - 18 (FDG) INJECTION
8.3000 | Freq: Once | INTRAVENOUS | Status: AC | PRN
  Administered 2019-04-27: 7.72 via INTRAVENOUS

## 2019-04-28 ENCOUNTER — Telehealth: Payer: Self-pay | Admitting: Pulmonary Disease

## 2019-04-28 ENCOUNTER — Encounter: Payer: Self-pay | Admitting: Oncology

## 2019-04-28 ENCOUNTER — Ambulatory Visit
Admission: RE | Admit: 2019-04-28 | Discharge: 2019-04-28 | Disposition: A | Discharge status: Home / Self Care | Payer: Medicare Other | Source: Ambulatory Visit | Attending: Radiation Oncology | Admitting: Radiation Oncology

## 2019-04-28 ENCOUNTER — Other Ambulatory Visit: Payer: Self-pay

## 2019-04-28 ENCOUNTER — Inpatient Hospital Stay: Payer: Medicare Other | Admitting: Oncology

## 2019-04-28 VITALS — BP 168/74 | HR 73 | Temp 96.3°F | Resp 20 | Wt 161.0 lb

## 2019-04-28 DIAGNOSIS — Z7189 Other specified counseling: Secondary | ICD-10-CM

## 2019-04-28 DIAGNOSIS — Z791 Long term (current) use of non-steroidal anti-inflammatories (NSAID): Secondary | ICD-10-CM | POA: Diagnosis not present

## 2019-04-28 DIAGNOSIS — Z96652 Presence of left artificial knee joint: Secondary | ICD-10-CM | POA: Diagnosis not present

## 2019-04-28 DIAGNOSIS — Z950 Presence of cardiac pacemaker: Secondary | ICD-10-CM | POA: Diagnosis not present

## 2019-04-28 DIAGNOSIS — G893 Neoplasm related pain (acute) (chronic): Secondary | ICD-10-CM

## 2019-04-28 DIAGNOSIS — E785 Hyperlipidemia, unspecified: Secondary | ICD-10-CM | POA: Diagnosis not present

## 2019-04-28 DIAGNOSIS — C7951 Secondary malignant neoplasm of bone: Secondary | ICD-10-CM

## 2019-04-28 DIAGNOSIS — Z7982 Long term (current) use of aspirin: Secondary | ICD-10-CM | POA: Diagnosis not present

## 2019-04-28 DIAGNOSIS — I255 Ischemic cardiomyopathy: Secondary | ICD-10-CM | POA: Diagnosis not present

## 2019-04-28 DIAGNOSIS — I110 Hypertensive heart disease with heart failure: Secondary | ICD-10-CM | POA: Diagnosis not present

## 2019-04-28 DIAGNOSIS — I5022 Chronic systolic (congestive) heart failure: Secondary | ICD-10-CM | POA: Diagnosis not present

## 2019-04-28 DIAGNOSIS — I472 Ventricular tachycardia: Secondary | ICD-10-CM | POA: Diagnosis not present

## 2019-04-28 DIAGNOSIS — Z79899 Other long term (current) drug therapy: Secondary | ICD-10-CM | POA: Diagnosis not present

## 2019-04-28 DIAGNOSIS — Z87891 Personal history of nicotine dependence: Secondary | ICD-10-CM | POA: Diagnosis not present

## 2019-04-28 DIAGNOSIS — R942 Abnormal results of pulmonary function studies: Secondary | ICD-10-CM | POA: Diagnosis not present

## 2019-04-28 DIAGNOSIS — E1136 Type 2 diabetes mellitus with diabetic cataract: Secondary | ICD-10-CM | POA: Diagnosis not present

## 2019-04-28 DIAGNOSIS — I2510 Atherosclerotic heart disease of native coronary artery without angina pectoris: Secondary | ICD-10-CM | POA: Diagnosis not present

## 2019-04-28 DIAGNOSIS — Z7984 Long term (current) use of oral hypoglycemic drugs: Secondary | ICD-10-CM | POA: Diagnosis not present

## 2019-04-28 DIAGNOSIS — C801 Malignant (primary) neoplasm, unspecified: Secondary | ICD-10-CM | POA: Diagnosis not present

## 2019-04-28 DIAGNOSIS — Z85850 Personal history of malignant neoplasm of thyroid: Secondary | ICD-10-CM | POA: Diagnosis not present

## 2019-04-28 DIAGNOSIS — I252 Old myocardial infarction: Secondary | ICD-10-CM | POA: Diagnosis not present

## 2019-04-28 DIAGNOSIS — M1611 Unilateral primary osteoarthritis, right hip: Secondary | ICD-10-CM | POA: Diagnosis not present

## 2019-04-28 DIAGNOSIS — C3490 Malignant neoplasm of unspecified part of unspecified bronchus or lung: Secondary | ICD-10-CM | POA: Diagnosis not present

## 2019-04-28 MED ORDER — ONDANSETRON 4 MG PO TBDP: 4.0000 mg | ORAL_TABLET | Freq: Three times a day (TID) | ORAL | 1 refills | Status: DC | PRN

## 2019-04-28 NOTE — Telephone Encounter (Signed)
Lm with pt's spouse requesting that pt contact our office to schedule an appt with LG on 1/21/ to discuss possible EBUS.

## 2019-04-28 NOTE — Telephone Encounter (Signed)
Pt has been scheduled with Dr. Patsey Berthold 04/30/2019 at 2:30.

## 2019-04-29 ENCOUNTER — Ambulatory Visit
Admission: RE | Admit: 2019-04-29 | Discharge: 2019-04-29 | Disposition: A | Discharge status: Home / Self Care | Payer: Medicare Other | Source: Ambulatory Visit | Attending: Radiation Oncology | Admitting: Radiation Oncology

## 2019-04-29 ENCOUNTER — Ambulatory Visit: Payer: Medicare Other

## 2019-04-29 ENCOUNTER — Other Ambulatory Visit: Payer: Self-pay

## 2019-04-29 DIAGNOSIS — Z510 Encounter for antineoplastic radiation therapy: Secondary | ICD-10-CM | POA: Diagnosis not present

## 2019-04-29 DIAGNOSIS — C7951 Secondary malignant neoplasm of bone: Secondary | ICD-10-CM | POA: Diagnosis not present

## 2019-04-30 ENCOUNTER — Ambulatory Visit
Admission: RE | Admit: 2019-04-30 | Discharge: 2019-04-30 | Disposition: A | Discharge status: Home / Self Care | Payer: Medicare Other | Source: Ambulatory Visit | Attending: Radiation Oncology | Admitting: Radiation Oncology

## 2019-04-30 ENCOUNTER — Encounter: Payer: Self-pay | Admitting: Oncology

## 2019-04-30 ENCOUNTER — Telehealth: Payer: Self-pay | Admitting: Pulmonary Disease

## 2019-04-30 ENCOUNTER — Encounter: Payer: Self-pay | Admitting: *Deleted

## 2019-04-30 ENCOUNTER — Other Ambulatory Visit: Payer: Self-pay

## 2019-04-30 ENCOUNTER — Encounter: Payer: Self-pay | Admitting: Pulmonary Disease

## 2019-04-30 ENCOUNTER — Ambulatory Visit: Payer: Medicare Other | Admitting: Pulmonary Disease

## 2019-04-30 VITALS — BP 142/80 | HR 89 | Temp 97.6°F | Ht 60.0 in | Wt 162.4 lb

## 2019-04-30 DIAGNOSIS — C7951 Secondary malignant neoplasm of bone: Secondary | ICD-10-CM | POA: Diagnosis not present

## 2019-04-30 DIAGNOSIS — Z7189 Other specified counseling: Secondary | ICD-10-CM | POA: Insufficient documentation

## 2019-04-30 DIAGNOSIS — R042 Hemoptysis: Secondary | ICD-10-CM

## 2019-04-30 DIAGNOSIS — I509 Heart failure, unspecified: Secondary | ICD-10-CM

## 2019-04-30 DIAGNOSIS — R918 Other nonspecific abnormal finding of lung field: Secondary | ICD-10-CM | POA: Diagnosis not present

## 2019-04-30 DIAGNOSIS — Z510 Encounter for antineoplastic radiation therapy: Secondary | ICD-10-CM | POA: Diagnosis not present

## 2019-04-30 NOTE — Telephone Encounter (Signed)
Bronch with EBUS is scheduled for 05/11/2019 at 1:00. Dx: mediastinal adenopathy and mass. CPT: [phone]  Suanne Marker, please see bronch info.

## 2019-04-30 NOTE — Progress Notes (Signed)
Hematology/Oncology Consult note [facility]  [phone] [phone]  Patient Care Team: Rubye Beach as PCP - General (Family Medicine) Deboraha Sprang, MD as PCP - Cardiology (Cardiology) Telford Nab, RN as Registered Nurse   Name of the patient: Megan Shelton  [id number]  04-Apr-1941   Date of visit: 04/30/19  Diagnosis-metastatic lung cancer  Chief complaint/ Reason for visit-discuss PET CT scan results and further management  Heme/Onc history: patient is a 79 year old female with a past medical history significant for congestive heart failure with AICD placement, history of thyroid cancer s/p thyroidectomy.  She was also a chronic smoker but quit smoking in 2006. More recently patient presented with pain in her left arm which led to an x-ray.  X-ray showed comminuted possibly pathologic midshaft humeral fracture.  This was followed by a whole-body bone scan which showed multiple sites of abnormal tracer uptake involving left scapula, bilateral posterior ribs, thoracic and lumbar spine, pelvis and multiple sites in the proximal and distal left femur as well as left humeral diaphysis.    PET CT scan shows 4 cm left retrohilar mass in the upper lobe but invading across the major fissure into the left lower lobe with an SUV of 9.1.  Scattered osseous metastasis with humeral pathological fracture.  Interval history-patient still reports having significant pain in her left arm and is starting palliative radiation.  She has been unable to lift up her left arm.  ECOG PS- 1 Pain scale- 4 Opioid associated constipation- no  Review of systems- Review of Systems  Constitutional: Positive for malaise/fatigue. Negative for chills, fever and weight loss.  HENT: Negative for congestion, ear discharge and nosebleeds.   Eyes: Negative for blurred vision.  Respiratory: Negative for cough, hemoptysis, sputum production, shortness of  breath and wheezing.   Cardiovascular: Negative for chest pain, palpitations, orthopnea and claudication.  Gastrointestinal: Negative for abdominal pain, blood in stool, constipation, diarrhea, heartburn, melena, nausea and vomiting.  Genitourinary: Negative for dysuria, flank pain, frequency, hematuria and urgency.  Musculoskeletal: Negative for back pain, joint pain and myalgias.       Left arm pain  Skin: Negative for rash.  Neurological: Negative for dizziness, tingling, focal weakness, seizures, weakness and headaches.  Endo/Heme/Allergies: Does not bruise/bleed easily.  Psychiatric/Behavioral: Negative for depression and suicidal ideas. The patient does not have insomnia.       Allergies  Allergen Reactions  . Latex   . Nickel      Past Medical History:  Diagnosis Date  . Diabetes mellitus with neuropathy ([place])   . Hypertensive heart disease   . Implantable cardioverter-defibrillator (ICD)-[employer]   . Myocardial infarction, anterior wall ([place])   . Obesity, Class II, BMI 35-39.9      Past Surgical History:  Procedure Laterality Date  . CARPAL TUNNEL RELEASE    . cataract surgery    . EYE SURGERY      Social History   Socioeconomic History  . Marital status: Widowed    Spouse name: Not on file  . Number of children: 3  . Years of education: Not on file  . Highest education level: High school graduate  Occupational History  . Occupation: retired  Tobacco Use  . Smoking status: Former Smoker    Packs/day: 1.00    Years: 55.00    Pack years: 55.00    Types: Cigarettes    Quit date: 2006    Years since quitting: 15.0  . Smokeless tobacco:  Never Used  . Tobacco comment: quit about 18 years ago; as of 2019  Substance and Sexual Activity  . Alcohol use: No  . Drug use: No  . Sexual activity: Not on file  Other Topics Concern  . Not on file  Social History Narrative  . Not on file   Social Determinants of Health   Financial Resource Strain:   .  Difficulty of Paying Living Expenses: Not on file  Food Insecurity:   . Worried About Charity fundraiser in the Last Year: Not on file  . Ran Out of Food in the Last Year: Not on file  Transportation Needs:   . Lack of Transportation (Medical): Not on file  . Lack of Transportation (Non-Medical): Not on file  Physical Activity: Inactive  . Days of Exercise per Week: 0 days  . Minutes of Exercise per Session: 0 min  Stress: No Stress Concern Present  . Feeling of Stress : Not at all  Social Connections: Unknown  . Frequency of Communication with Friends and Family: Patient refused  . Frequency of Social Gatherings with Friends and Family: Patient refused  . Attends Religious Services: Patient refused  . Active Member of Clubs or Organizations: Patient refused  . Attends Archivist Meetings: Patient refused  . Marital Status: Patient refused  Intimate Partner Violence: Unknown  . Fear of Current or Ex-Partner: Patient refused  . Emotionally Abused: Patient refused  . Physically Abused: Patient refused  . Sexually Abused: Patient refused    Family History  Problem Relation Age of Onset  . Scleroderma Mother   . Raynaud syndrome Mother   . Cancer Father 11       colon cancer  . Heart disease Brother        died of heart attack  . Diabetes Brother      Current Outpatient Medications:  .  atorvastatin (LIPITOR) 40 MG tablet, TAKE 1 TABLET BY MOUTH  DAILY, Disp: 90 tablet, Rfl: 3 .  Blood Glucose Monitoring Suppl (ACCU-CHEK COMPACT CARE KIT) KIT, To check blood sugar daily., Disp: 1 kit, Rfl: 0 .  Blood Glucose Monitoring Suppl (ACCU-CHEK GUIDE) w/Device KIT, , Disp: , Rfl:  .  clonazePAM (KLONOPIN) 1 MG tablet, TAKE 1 TABLET BY MOUTH 3 TIMES A DAY AS NEEDED FOR ANXIETY, Disp: 90 tablet, Rfl: 5 .  furosemide (LASIX) 40 MG tablet, TAKE 1 TABLET BY MOUTH EVERY DAY AS NEEDED, Disp: 90 tablet, Rfl: 1 .  glucose blood (ACCU-CHEK COMPACT PLUS) test strip, To check blood  sugar daily., Disp: 100 each, Rfl: 12 .  hydrocortisone 1 % ointment, Apply 1 application topically 2 (two) times daily. May use for skin irritation on bilateral eyelids. Do not get inside of eyes., Disp: 30 g, Rfl: 0 .  ibuprofen (ADVIL) 800 MG tablet, Take 1 tablet by mouth as needed., Disp: , Rfl:  .  Lancets (ACCU-CHEK SOFT TOUCH) lancets, To check blood sugar daily., Disp: 100 each, Rfl: 12 .  levothyroxine (SYNTHROID) 112 MCG tablet, TAKE 1 TABLET BY MOUTH  DAILY BEFORE BREAKFAST, Disp: 90 tablet, Rfl: 3 .  lisinopril (ZESTRIL) 10 MG tablet, TAKE 1 TABLET BY MOUTH  DAILY, Disp: 90 tablet, Rfl: 3 .  metFORMIN (GLUCOPHAGE-XR) 500 MG 24 hr tablet, TAKE 1 TABLET BY MOUTH IN  THE MORNING AND 2 TABLETS  IN THE EVENING (Patient taking differently: Take 500 mg by mouth 2 (two) times daily. ), Disp: 270 tablet, Rfl: 3 .  metoprolol tartrate (LOPRESSOR)  25 MG tablet, TAKE 1 TABLET BY MOUTH TWO  TIMES DAILY, Disp: 180 tablet, Rfl: 3 .  ONETOUCH DELICA LANCETS 55D MISC, CHECK BLOOD SUGAR ONCE  DAILY, Disp: 100 each, Rfl: 11 .  oxyCODONE (OXY IR/ROXICODONE) 5 MG immediate release tablet, Take 1 tablet (5 mg total) by mouth every 4 (four) hours as needed for severe pain., Disp: 60 tablet, Rfl: 0 .  pyridOXINE (VITAMIN B-6) 100 MG tablet, Take 100 mg by mouth daily., Disp: , Rfl:  .  sitaGLIPtin (JANUVIA) 100 MG tablet, Take 100 mg by mouth daily., Disp: , Rfl:  .  vitamin B-12 (CYANOCOBALAMIN) 100 MCG tablet, Take 100 mcg by mouth daily., Disp: , Rfl:  .  aspirin 81 MG tablet, Take 81 mg by mouth daily., Disp: , Rfl:  .  Coenzyme Q10 (COQ10 PO), Take 1 capsule by mouth daily., Disp: , Rfl:  .  glimepiride (AMARYL) 1 MG tablet, TAKE 1 TABLET BY MOUTH TWO  TIMES DAILY (Patient not taking: Reported on 04/28/2019), Disp: 180 tablet, Rfl: 3 .  ondansetron (ZOFRAN ODT) 4 MG disintegrating tablet, Take 1 tablet (4 mg total) by mouth every 8 (eight) hours as needed for nausea or vomiting., Disp: 60 tablet, Rfl:  1  Physical exam:  Vitals:   04/28/19 0933  BP: (!) 168/74  Pulse: 73  Resp: 20  Temp: (!) 96.3 F (35.7 C)  TempSrc: Tympanic  SpO2: 99%  Weight: 161 lb (73 kg)   Physical Exam Constitutional:      General: She is not in acute distress. HENT:     Head: Normocephalic and atraumatic.  Eyes:     Pupils: Pupils are equal, round, and reactive to light.  Cardiovascular:     Rate and Rhythm: Normal rate and regular rhythm.     Heart sounds: Normal heart sounds.  Pulmonary:     Effort: Pulmonary effort is normal.     Breath sounds: Normal breath sounds.  Abdominal:     General: Bowel sounds are normal.     Palpations: Abdomen is soft.  Musculoskeletal:     Cervical back: Normal range of motion.  Skin:    General: Skin is warm and dry.  Neurological:     Mental Status: She is alert and oriented to person, place, and time.      CMP Latest Ref Rng & Units 04/20/2019  Glucose 70 - 99 mg/dL 167(H)  BUN 8 - 23 mg/dL 13  Creatinine 0.44 - 1.00 mg/dL 0.40(L)  Sodium 135 - 145 mmol/L 137  Potassium 3.5 - 5.1 mmol/L 3.6  Chloride 98 - 111 mmol/L 102  CO2 22 - 32 mmol/L 23  Calcium 8.9 - 10.3 mg/dL 9.1  Total Protein 6.5 - 8.1 g/dL 7.4  Total Bilirubin 0.3 - 1.2 mg/dL 0.6  Alkaline Phos 38 - 126 U/L 184(H)  AST 15 - 41 U/L 18  ALT 0 - 44 U/L 16   CBC Latest Ref Rng & Units 04/20/2019  WBC 4.0 - 10.5 K/uL 9.6  Hemoglobin 12.0 - 15.0 g/dL 14.6  Hematocrit 36.0 - 46.0 % 46.1(H)  Platelets 150 - 400 K/uL 249    No images are attached to the encounter.  NM Bone Scan Whole Body  Result Date: 04/15/2019 CLINICAL DATA:  Pathologic fracture LEFT humerus, follow-up EXAM: NUCLEAR MEDICINE WHOLE BODY BONE SCAN TECHNIQUE: Whole body anterior and posterior images were obtained approximately 3 hours after intravenous injection of radiopharmaceutical. RADIOPHARMACEUTICALS:  22.005 mCi Technetium-65mMDP IV COMPARISON:  None Radiographic  correlation: LEFT humeral radiographs 02/14/2019  FINDINGS: Multiple sites of abnormal osseous tracer uptake are identified consistent with osseous metastatic disease. These include proximal humeri, tip of LEFT scapula, BILATERAL posterior ribs, thoracic and lumbar spine, pelvis, and multiple sites in the proximal and distal LEFT femur. Intense uptake at mid LEFT humeral diaphysis corresponding to known pathologic fracture. Degenerative type uptake at RIGHT knee with note of a LEFT knee prosthesis. Photopenic region at the upper anterior LEFT chest corresponding to pacemaker generator. Expected urinary tract and soft tissue distribution of tracer. IMPRESSION: Multiple sites of abnormal tracer uptake consistent with osseous metastatic disease. These include sites within the humeri bilaterally and within the LEFT femur; radiographs of the LEFT femur and RIGHT humerus recommended to exclude metastatic lesions at risk for pathologic fracture. Electronically Signed   By: Lavonia Dana M.D.   On: 04/15/2019 08:34   NM PET Image Initial (PI) Whole Body  Result Date: 04/27/2019 CLINICAL DATA:  Initial treatment strategy for pathologic fracture of left humerus and other sites in the skeleton, assessment for primary. EXAM: NUCLEAR MEDICINE PET WHOLE BODY TECHNIQUE: 7.7 mCi F-18 FDG was injected intravenously. Full-ring PET imaging was performed from the skull base to thigh after the radiotracer. CT data was obtained and used for attenuation correction and anatomic localization. Fasting blood glucose: 164 mg/dl COMPARISON:  Serve multiple whole-body bone scan from 04/14/2019 FINDINGS: Mediastinal blood pool activity: SUV max 3.1 HEAD/NECK: Reduced activity and some local hypodensity in the left parietal lobe could be from an old stroke or underlying lesion. There is also some asymmetric hypodensity in the left frontal white matter which could be from asymmetric chronic ischemic microvascular white matter disease or vasogenic edema. If the patient's pacer/AICD prevents MRI,  dedicated CT of the brain with and without contrast would be recommended. If the device is MRI compatible, then MRI brain with and without contrast would be preferred. Incidental CT findings: Bilateral common carotid atherosclerotic calcification. Acute right and chronic left sphenoid sinusitis with mild chronic ethmoid sinusitis. CHEST: Left upper lobe retro hilar mass measures approximately 4.0 by 3.0 cm on image 112/3, maximum SUV 9.1. This probably invades across the fissure into the superior segment left lower lobe. A variety of nodules are present bilaterally which are not appreciably hypermetabolic but are below sensitive PET-CT size thresholds. This includes a 7 mm ground-glass density nodule in the apical segment right upper lobe on image 96/3; a 4 mm right basilar nodule on image 140/3; a 4 mm lingular nodule on image 128/3; a 0.7 by 0.6 cm left lower lobe nodule on image 135/3; and a 0.4 cm left lower lobe nodule on image 140/3. Incidental CT findings: Coronary, aortic arch, and branch vessel atherosclerotic vascular disease. Pacer/AICD noted. Mild cardiomegaly. Subcoracoid bursitis on the right. ABDOMEN/PELVIS: Focal anal activity, maximum SUV 7.3, probably physiologic but technically nonspecific. Incidental CT findings: Aortoiliac atherosclerotic vascular disease. Probable 3 mm right mid kidney nonobstructive renal calculus. Sigmoid diverticulosis. Uterine fibroids. SKELETON: Scattered skeletal metastatic disease including the pathologic fracture in the left mid humerus, with maximum SUV 7.7. Metastatic lesions are identified in the thoracic vertebra (for example a T1 right eccentric lesion with maximum SUV 5.9) in the ribs (for example a medial right sixth rib lesion maximum SUV 5.1, lumbar spine, left upper acetabulum (a lytic lesion measuring 3.2 cm in diameter with maximum SUV 7.1) right acetabulum, left proximal femur (maximum SUV 6.0) distally in the left femur; likely in the left medial femoral  condyle posteriorly where  there is maximum SUV of 6.9; and in the right proximal humeral metaphysis (maximum SUV 6.0). Incidental CT findings: Incidental failure of fusion of the posterior arch of C1. Chronic bilateral pars defects at L5 with grade 2 anterolisthesis of L5 on S1. EXTREMITIES: No significant abnormal hypermetabolic activity in this region. Incidental CT findings: Incidental left iliopsoas lipoma. Left total knee prosthesis. Osteoarthritis of the right knee. SFA and popliteal artery atherosclerotic calcifications. IMPRESSION: 1. 4 cm left retro hilar mass primarily in the left upper lobe but probably invading across the major fissure into the superior segment left lower lobe, maximum SUV 9.1, compatible with malignancy and likely reflecting lung primary for the metastatic disease. 2. Scattered osseous metastatic lesions including the left mid humeral pathologic fracture, as detailed above. One notable lesion is the lytic left acetabular metastatic lesion measuring 3.2 cm in diameter with maximum SUV 7.1. 3. Two areas of hypodensity in the left cerebral hemisphere could be residua from prior stroke or chronic microvascular white matter disease, but intracranial metastatic disease is not totally excluded. MRI brain with and without contrast would be preferred but the patient has a pacer/AICD device; if this device is not MRI compatible, then consider CT of the head with and without contrast for further characterization. 4. Focal activity along the anus is probably physiologic but technically nonspecific. 5. Other imaging findings of potential clinical significance: Acute and chronic paranasal sinusitis. Aortic Atherosclerosis (ICD10-I70.0). Coronary atherosclerosis. Mild cardiomegaly. Right subcoracoid bursitis. Probable nonobstructive right nephrolithiasis. Sigmoid diverticulosis. Uterine fibroids. Chronic pars defects at L5 with grade 2 anterolisthesis. Electronically Signed   By: Van Clines  M.D.   On: 04/27/2019 13:44   DG Humerus Right  Result Date: 04/20/2019 CLINICAL DATA:  Osseous metastatic disease. EXAM: RIGHT HUMERUS - 2+ VIEW COMPARISON:  Bone scan dated April 14, 2019. FINDINGS: 1.7 cm lucent lesion in the proximal humeral diaphysis. No additional focal bone lesion. No acute fracture or dislocation. Mild degenerative changes of the shoulder and elbow. IMPRESSION: 1. 1.7 cm lucent lesion in the proximal humeral diaphysis corresponding to the area of abnormal uptake on recent bone scan, consistent with osseous metastatic disease. Electronically Signed   By: Titus Dubin M.D.   On: 04/20/2019 15:54   DG HIP UNILAT WITH PELVIS 2-3 VIEWS LEFT  Result Date: 04/20/2019 CLINICAL DATA:  Osseous metastatic disease. EXAM: LEFT FEMUR 2 VIEWS; DG HIP (WITH OR WITHOUT PELVIS) 2-3V LEFT COMPARISON:  Bone scan dated April 14, 2019. FINDINGS: Ill-defined 2.9 cm lucent lesion in the left superior acetabulum. 1.5 cm lucent lesion in the distal left femoral metadiaphysis. No discrete lesion identified in the left proximal femur. Mild right hip osteoarthritis. Prior left total knee arthroplasty. Calcified fibroid in the pelvis. Vascular calcifications. IMPRESSION: 1. Lucent lesions in the left superior acetabulum and distal left femoral metadiaphysis corresponding to areas of abnormal uptake on recent bone scan, consistent with osseous metastatic disease. 2. No discrete lesion identified in the left proximal femur at the site of abnormal uptake seen on recent both scan. Electronically Signed   By: Titus Dubin M.D.   On: 04/20/2019 15:59   DG FEMUR MIN 2 VIEWS LEFT  Result Date: 04/20/2019 CLINICAL DATA:  Osseous metastatic disease. EXAM: LEFT FEMUR 2 VIEWS; DG HIP (WITH OR WITHOUT PELVIS) 2-3V LEFT COMPARISON:  Bone scan dated April 14, 2019. FINDINGS: Ill-defined 2.9 cm lucent lesion in the left superior acetabulum. 1.5 cm lucent lesion in the distal left femoral metadiaphysis. No discrete  lesion identified in the  left proximal femur. Mild right hip osteoarthritis. Prior left total knee arthroplasty. Calcified fibroid in the pelvis. Vascular calcifications. IMPRESSION: 1. Lucent lesions in the left superior acetabulum and distal left femoral metadiaphysis corresponding to areas of abnormal uptake on recent bone scan, consistent with osseous metastatic disease. 2. No discrete lesion identified in the left proximal femur at the site of abnormal uptake seen on recent both scan. Electronically Signed   By: Titus Dubin M.D.   On: 04/20/2019 15:59     Assessment and plan- Patient is a 79 y.o. female with left lung mass and multiple bone metastases concerning for metastatic lung cancer here to discuss PET CT scan results  I have personally reviewed PET/CT scan images independently and discussed findings with the patient.I have also discussed her case with IR and pulmonary.  PET CT scan does show a primary lung mass which is likely metastatic lung cancer causing bone metastases.  Patient needs tissue biopsy and therefore lung biopsy.  This mass is not safely approachable through a CT-guided approach and therefore I will refer her to pulmonary for bronchoscopy guided biopsy.  Discussed with the patient that there are 2 types of lung cancer small and non-small cell lung cancer.  Depending on what type of lung cancer she has treatment will be based on that.  Unfortunately she does have stage IV disease and therefore treatment would be palliative and not curative.  Stage IV lung cancer without treatment carries a prognosis of less than 6 months.  With treatment it could be a year to a year and a half based on how she tolerates it in response.  We will also be sending NGS testing on the tumor specimen.  Discussed that targeted treatment is available for non-small cell lung cancer with actionable mutations.  I would like to start off with chemotherapy for her first cycle while we wait for the results of  the NGS testing to come back.  Patient is currently completing palliative radiation to her left arm and I will tentatively plan to start chemotherapy in 2 weeks time.  We will refer her to vascular surgery for port placement.  Chemo teach will be based on final pathology.  Neoplasm related pain: Continue as needed oxycodone.  Hopefully palliative radiation to the left arm will also help.  She has already met with orthopedics and there is no plan on surgical fixation at this time.  Also discussed the role of bisphosphonates to reduce the chances of skeletal related fractures and I recommend that she should get Xgeva every 4 weeks.  Discussed risks and benefits of Xgeva including all but not limited to fatigue, hypocalcemia and osteonecrosis of the jaw.  Need for dental clearance prior to starting Xgeva.  Patient will work on getting dental clearance.  She will also be getting CT brain with and without contrast to rule out brain metastases given that she cannot go through MRI with her ICD placement   Total face to face encounter time for this patient visit was 30 min.  Total non face to face encounter time for this patient on the day of the visit was 20 min. Time spent in reviewing outside records as well as discussing patients case with interventional radiology and pulmonary    Visit Diagnosis 1. Bone metastases ([place])   2. Abnormal PET scan, lung   3. Goals of care, counseling/discussion   4. Neoplasm related pain      Dr. Randa Evens, MD, MPH [place] [facility]  [facility] [phone] 04/30/2019 10:04 AM

## 2019-04-30 NOTE — Progress Notes (Signed)
  Oncology Nurse Navigator Documentation  Navigator Location: [facility] (04/30/19 1200)   )Navigator Encounter Type: Lobby (04/30/19 1200)   Abnormal Finding Date: 04/15/19 (04/30/19 1200)                 Patient Visit Type: RadOnc (04/30/19 1200)   Barriers/Navigation Needs: Coordination of Care (04/30/19 1200)   Interventions: Coordination of Care (04/30/19 1200)   Coordination of Care: Appts (04/30/19 1200)         met with patient after radiation treatment today to introduce to navigator services. All questions answered during visit. Contact info given and instructed to call with any further questions or needs. Pt verbalized understanding. Will follow up at next clinic visit.          Time Spent with Patient: 30 (04/30/19 1200)

## 2019-04-30 NOTE — Patient Instructions (Addendum)
1.  He will need clearance for cardiology to evaluate you for safety to undergo general anesthesia  2.  We will also review your CT scan of the head when it is done  3.  We are tentatively scheduling you for bronchoscopy with endobronchial ultrasound on 11 May 2019 at 1300 hrs.  4.  We will see you in follow-up 2 weeks after the procedure

## 2019-05-01 ENCOUNTER — Other Ambulatory Visit: Payer: Self-pay

## 2019-05-01 ENCOUNTER — Ambulatory Visit
Admission: RE | Admit: 2019-05-01 | Discharge: 2019-05-01 | Disposition: A | Discharge status: Home / Self Care | Payer: Medicare Other | Source: Ambulatory Visit | Attending: Radiation Oncology | Admitting: Radiation Oncology

## 2019-05-01 ENCOUNTER — Encounter: Payer: Self-pay | Admitting: Pulmonary Disease

## 2019-05-01 DIAGNOSIS — Z510 Encounter for antineoplastic radiation therapy: Secondary | ICD-10-CM | POA: Diagnosis not present

## 2019-05-01 DIAGNOSIS — C7951 Secondary malignant neoplasm of bone: Secondary | ICD-10-CM | POA: Diagnosis not present

## 2019-05-01 NOTE — Telephone Encounter (Signed)
Called and spoke with Shirlee Limerick at [facility] on 05/01/2019 at 11:26 am EST.  Procedure codes [phone], [phone] and [phone] are V/B and does not require PA. Call Ref # [id number]. Rhonda J Cobb

## 2019-05-01 NOTE — Progress Notes (Signed)
Subjective:    Patient ID: Megan Shelton, female    DOB: 1941-01-27, 79 y.o.   MRN: [id number]  HPI Megan Shelton is a former smoker (quit 2006) who presents for evaluation of a 4 cm retrohilar mass on the LEFT.  The patient is kindly referred by Dr. Randa Evens.  The patient states that in the early part of November 2020 she was trying to move a mattress in her home when she heard a "crack" and severe onset of pain in her LEFT arm.  She presented to the emergency room where imaging of her left humerus showed a fracture and concerns for a potential metastatic deposit.  Subsequent follow-up through orthopedics showed that she had indeed a metastatic deposit in the arm.  She subsequently had a nuclear medicine bone scan which showed numerous areas in the skeleton the work-up assistant with possible metastatic disease.  She was referred to oncology for evaluation of this issue.  Patient continues to have pain in her left arm despite having the arm immobilized.  She was referred to radiation oncology for radiation to palliate pain in this area.  She underwent a PET/CT on 18 January that has shown a 4 cm left retrohilar mass likely the source of primary tumor.  She has scattered osseous metastatic lesions and possible to cranial metastatic disease.  The patient does not describe any fevers, chills or sweats.  She has noted however streaky hemoptysis particularly in the mornings this has been worse over the last several days.  She is unsure of the onset of this symptom.  She seems somewhat anxious and befuddled today and at times tearful.  She has a history of cardiomyopathy and has had an implantable defibrillator for 3 years.  She does not endorse any weight loss, no orthopnea or paroxysmal nocturnal dyspnea and no lower extremity edema.  She has had no chest pain.  She has not noted any firings from her defibrillator.  She follows closely with cardiology in this regard.  She voices no other complaint  today.  She does admit to being anxious about the diagnosis of widely metastatic disease.  Past medical history, surgical history and family history have been reviewed.  As noted she is a former smoker she smoked 1 pack of cigarettes per day for 34 years she quit in 2006.  She has recided previously in [place] (native) and in [place] prior to relocating here approximately 2 to 3 years ago.  Review of Systems A 10 point review of systems was performed and it is as noted above otherwise negative.    Objective:   Physical Exam BP (!) 142/80 (BP Location: Left Arm, Patient Position: Sitting, Cuff Size: Large)   Pulse 89   Temp 97.6 F (36.4 C) (Temporal)   Ht 5' (1.524 m)   Wt 162 lb 6.4 oz (73.7 kg)   SpO2 94% Comment: ON RA  BMI 31.72 kg/m  GENERAL: Well-developed, overweight woman who looks somewhat younger than her stated age.  She is awake and alert, appears uncomfortable due to left arm immobilization and she is anxious and tearful. HEAD: Normocephalic, atraumatic.  EYES: Pupils equal, round, reactive to light.  No scleral icterus. MOUTH: Nose/mouth/throat not examined due to masking requirements for COVID 19. NECK: Supple. No thyromegaly. No nodules. No JVD. Trachea midline, no crepitus PULMONARY: Distant breath sounds, no wheezes or rhonchi noted.  Coarse breath sounds overall. CARDIOVASCULAR: S1 and S2. Regular rate and rhythm.  AICD noted left chest. GASTROINTESTINAL:  Protuberant abdomen, nondistended MUSCULOSKELETAL: Left upper extremity in immobilize, no clubbing, no lower extremity edema.  NEUROLOGIC: Awake and alert, no overt focal deficit, speech fluent. SKIN: Intact,warm,dry. PSYCH: Anxious, tearful.  Representative image from PET/CT performed 27 April 2019:      Assessment & Plan:   LEFT 4.4 cm retrohilar mass, FDG avid, carcinoma until proven otherwise  This appears to be primary site of malignancy  The best approach for biopsy would be bronchoscopy with  endobronchial ultrasound and TBNA  High likelihood of seeing endobronchial lesion as patient has had streaky hemoptysis daily  Patient however is high risk for general anesthesia and needs risk assessment preoperatively  Identified risks: Cardiomyopathy with AICD, potential brain metastasis  Recommend evaluation by cardiology for risk assessment preoperatively with regards to general anesthesia for the procedure  Await CT scan of the head with and without contrast to evaluate for potential mets and potential cerebral edema  Cerebral edema would be a high risk due to potential for increasing intracranial pressure during the procedure  Will schedule a procedure tentatively for 1 February at 1300 hrs.  Benefits, limitations and potential complications of the procedure were discussed with the patient/family  including, but not limited to bleeding, hemoptysis, respiratory failure requiring intubation and/or prolongued mechanical ventilation, infection, pneumothorax (collapse of lung) requiring chest tube placement, stroke from air embolism or even death  Patient agrees to proceed if evaluations above do not show prohibitive risk  Cough and hemoptysis This is likely due to the patient's lung mass Bronchoscopic evaluation as above  Metastatic disease to the bone We will proceed with biopsy as noted above if preoperative assessment is favorable  Potential intracranial metastatic disease Patient to undergo CT scan of the head with and without contrast on 29 January Patient cannot undergo MRI due to non MRI safe AICD If patient has metastatic disease with surrounding edema will need Decadron to decrease edema as edema would increase risk during general anesthesia of potential increased intracranial pressure   The above discussed with Dr. Randa Evens.  Patient's son was present during the discussion.  A total of 65 minutes of face-to-face time with greater than 50% time spent on discussion  of preoperative risk assessment, procedure risk and complications, and management of lung cancer were discussed with the patient and her son.  Renold Don, MD [facility]   *This note was dictated using voice recognition software/Dragon.  Despite best efforts to proofread, errors can occur which can change the meaning.  Any change was purely unintentional.

## 2019-05-01 NOTE — Telephone Encounter (Signed)
Phone visit for pre admit testing is scheduled for 05/06/2018 between 1-5 and covid test 05/07/2018. Pt is aware of dates/times listed above.  Nothing further is needed.

## 2019-05-01 NOTE — H&P (View-Only) (Signed)
Subjective:    Patient ID: Megan Shelton, female    DOB: 04/12/1940, 79 y.o.   MRN: [id number]  HPI [name] is a former smoker (quit 2006) who presents for evaluation of a 4 cm retrohilar mass on the LEFT.  The patient is kindly referred by Dr. Randa Shelton.  The patient states that in the early part of November 2020 she was trying to move a mattress in her home when she heard a "crack" and severe onset of pain in her LEFT arm.  She presented to the emergency room where imaging of her left humerus showed a fracture and concerns for a potential metastatic deposit.  Subsequent follow-up through orthopedics showed that she had indeed a metastatic deposit in the arm.  She subsequently had a nuclear medicine bone scan which showed numerous areas in the skeleton the work-up assistant with possible metastatic disease.  She was referred to oncology for evaluation of this issue.  Patient continues to have pain in her left arm despite having the arm immobilized.  She was referred to radiation oncology for radiation to palliate pain in this area.  She underwent a PET/CT on 18 January that has shown a 4 cm left retrohilar mass likely the source of primary tumor.  She has scattered osseous metastatic lesions and possible to cranial metastatic disease.  The patient does not describe any fevers, chills or sweats.  She has noted however streaky hemoptysis particularly in the mornings this has been worse over the last several days.  She is unsure of the onset of this symptom.  She seems somewhat anxious and befuddled today and at times tearful.  She has a history of cardiomyopathy and has had an implantable defibrillator for 3 years.  She does not endorse any weight loss, no orthopnea or paroxysmal nocturnal dyspnea and no lower extremity edema.  She has had no chest pain.  She has not noted any firings from her defibrillator.  She follows closely with cardiology in this regard.  She voices no other complaint  today.  She does admit to being anxious about the diagnosis of widely metastatic disease.  Past medical history, surgical history and family history have been reviewed.  As noted she is a former smoker she smoked 1 pack of cigarettes per day for 34 years she quit in 2006.  She has recided previously in [place] (native) and in [place] prior to relocating here approximately 2 to 3 years ago.  Review of Systems A 10 point review of systems was performed and it is as noted above otherwise negative.    Objective:   Physical Exam BP (!) 142/80 (BP Location: Left Arm, Patient Position: Sitting, Cuff Size: Large)   Pulse 89   Temp 97.6 F (36.4 C) (Temporal)   Ht 5' (1.524 m)   Wt 162 lb 6.4 oz (73.7 kg)   SpO2 94% Comment: ON RA  BMI 31.72 kg/m  GENERAL: Well-developed, overweight woman who looks somewhat younger than her stated age.  She is awake and alert, appears uncomfortable due to left arm immobilization and she is anxious and tearful. HEAD: Normocephalic, atraumatic.  EYES: Pupils equal, round, reactive to light.  No scleral icterus. MOUTH: Nose/mouth/throat not examined due to masking requirements for COVID 19. NECK: Supple. No thyromegaly. No nodules. No JVD. Trachea midline, no crepitus PULMONARY: Distant breath sounds, no wheezes or rhonchi noted.  Coarse breath sounds overall. CARDIOVASCULAR: S1 and S2. Regular rate and rhythm.  AICD noted left chest. GASTROINTESTINAL:  Protuberant abdomen, nondistended MUSCULOSKELETAL: Left upper extremity in immobilize, no clubbing, no lower extremity edema.  NEUROLOGIC: Awake and alert, no overt focal deficit, speech fluent. SKIN: Intact,warm,dry. PSYCH: Anxious, tearful.  Representative image from PET/CT performed 27 April 2019:      Assessment & Plan:   LEFT 4.4 cm retrohilar mass, FDG avid, carcinoma until proven otherwise  This appears to be primary site of malignancy  The best approach for biopsy would be bronchoscopy with  endobronchial ultrasound and TBNA  High likelihood of seeing endobronchial lesion as patient has had streaky hemoptysis daily  Patient however is high risk for general anesthesia and needs risk assessment preoperatively  Identified risks: Cardiomyopathy with AICD, potential brain metastasis  Recommend evaluation by cardiology for risk assessment preoperatively with regards to general anesthesia for the procedure  Await CT scan of the head with and without contrast to evaluate for potential mets and potential cerebral edema  Cerebral edema would be a high risk due to potential for increasing intracranial pressure during the procedure  Will schedule a procedure tentatively for 1 February at 1300 hrs.  Benefits, limitations and potential complications of the procedure were discussed with the patient/family  including, but not limited to bleeding, hemoptysis, respiratory failure requiring intubation and/or prolongued mechanical ventilation, infection, pneumothorax (collapse of lung) requiring chest tube placement, stroke from air embolism or even death  Patient agrees to proceed if evaluations above do not show prohibitive risk  Cough and hemoptysis This is likely due to the patient's lung mass Bronchoscopic evaluation as above  Metastatic disease to the bone We will proceed with biopsy as noted above if preoperative assessment is favorable  Potential intracranial metastatic disease Patient to undergo CT scan of the head with and without contrast on 29 January Patient cannot undergo MRI due to non MRI safe AICD If patient has metastatic disease with surrounding edema will need Decadron to decrease edema as edema would increase risk during general anesthesia of potential increased intracranial pressure   The above discussed with Dr. Randa Shelton.  Patient's son was present during the discussion.  A total of 65 minutes of face-to-face time with greater than 50% time spent on discussion  of preoperative risk assessment, procedure risk and complications, and management of lung cancer were discussed with the patient and her son.  Megan Don, MD [facility]   *This note was dictated using voice recognition software/Dragon.  Despite best efforts to proofread, errors can occur which can change the meaning.  Any change was purely unintentional.

## 2019-05-04 ENCOUNTER — Telehealth: Payer: Self-pay

## 2019-05-04 ENCOUNTER — Ambulatory Visit
Admission: RE | Admit: 2019-05-04 | Discharge: 2019-05-04 | Disposition: A | Discharge status: Home / Self Care | Payer: Medicare Other | Source: Ambulatory Visit | Attending: Radiation Oncology | Admitting: Radiation Oncology

## 2019-05-04 ENCOUNTER — Ambulatory Visit (INDEPENDENT_AMBULATORY_CARE_PROVIDER_SITE_OTHER): Payer: Medicare Other

## 2019-05-04 ENCOUNTER — Other Ambulatory Visit: Payer: Self-pay

## 2019-05-04 DIAGNOSIS — Z510 Encounter for antineoplastic radiation therapy: Secondary | ICD-10-CM | POA: Diagnosis not present

## 2019-05-04 DIAGNOSIS — Z95810 Presence of automatic (implantable) cardiac defibrillator: Secondary | ICD-10-CM | POA: Diagnosis not present

## 2019-05-04 DIAGNOSIS — I509 Heart failure, unspecified: Secondary | ICD-10-CM

## 2019-05-04 DIAGNOSIS — C801 Malignant (primary) neoplasm, unspecified: Secondary | ICD-10-CM | POA: Diagnosis not present

## 2019-05-04 DIAGNOSIS — C7951 Secondary malignant neoplasm of bone: Secondary | ICD-10-CM | POA: Diagnosis not present

## 2019-05-04 NOTE — Progress Notes (Signed)
EPIC Encounter for ICM Monitoring  Patient Name: Trezure Cronk is a 79 y.o. female Date: 05/04/2019 Primary Care Physican: Mar Daring, PA-C Primary Cardiologist:Klein Electrophysiologist:Klein LastWeight:162lbs   Attempted call to patient and unable to reach. Transmission reviewed. Marland Kitchen  OptivolThoracic impedance suggesting possible fluid accumulation since 04/25/2019.   Prescribed:Furosemide40 mg take 1 tablet by mouth as needed  Labs: 04/20/2019 Creatinine 0.40, BUN 13, Potassium 3.6, Sodium 137, GFR >60 11/21/2018 Creatinine 0.74, BUN 12, Potassium 4.3, Sodium 140, GFR 79-91 05/16/2018 Creatinine 0.74, BUN 24, Potassium 4.4, Sodium 140, GFR 79-91  Recommendations:Unable to reach.     Follow-up plan: ICM clinic phone appointment on 05/11/2019 to recheck fluid levels. 91 day device clinic remote transmission 06/22/2019. Office visit with Dr Caryl Comes 05/05/2019.  Copy of ICM check sent to [facility].    3 month ICM trend: 05/04/2019    1 Year ICM trend:       Rosalene Billings, RN 05/04/2019 2:30 PM

## 2019-05-04 NOTE — Telephone Encounter (Signed)
Remote ICM transmission received.  Attempted call to patient regarding ICM remote transmission and mail box is full. 

## 2019-05-05 ENCOUNTER — Telehealth (INDEPENDENT_AMBULATORY_CARE_PROVIDER_SITE_OTHER): Payer: Self-pay

## 2019-05-05 ENCOUNTER — Ambulatory Visit: Payer: Medicare Other | Admitting: Oncology

## 2019-05-05 ENCOUNTER — Ambulatory Visit
Admission: RE | Admit: 2019-05-05 | Discharge: 2019-05-05 | Disposition: A | Discharge status: Home / Self Care | Payer: Medicare Other | Source: Ambulatory Visit | Attending: Radiation Oncology | Admitting: Radiation Oncology

## 2019-05-05 ENCOUNTER — Other Ambulatory Visit: Payer: Medicare Other

## 2019-05-05 ENCOUNTER — Ambulatory Visit (INDEPENDENT_AMBULATORY_CARE_PROVIDER_SITE_OTHER): Payer: Medicare Other | Admitting: Internal Medicine

## 2019-05-05 ENCOUNTER — Other Ambulatory Visit: Payer: Self-pay

## 2019-05-05 ENCOUNTER — Encounter: Payer: Self-pay | Admitting: Internal Medicine

## 2019-05-05 VITALS — BP 140/80 | HR 78 | Ht 60.0 in | Wt 161.5 lb

## 2019-05-05 DIAGNOSIS — C7951 Secondary malignant neoplasm of bone: Secondary | ICD-10-CM | POA: Diagnosis not present

## 2019-05-05 DIAGNOSIS — I255 Ischemic cardiomyopathy: Secondary | ICD-10-CM | POA: Diagnosis not present

## 2019-05-05 DIAGNOSIS — C801 Malignant (primary) neoplasm, unspecified: Secondary | ICD-10-CM | POA: Diagnosis not present

## 2019-05-05 DIAGNOSIS — I509 Heart failure, unspecified: Secondary | ICD-10-CM | POA: Diagnosis not present

## 2019-05-05 DIAGNOSIS — Z95810 Presence of automatic (implantable) cardiac defibrillator: Secondary | ICD-10-CM

## 2019-05-05 DIAGNOSIS — I10 Essential (primary) hypertension: Secondary | ICD-10-CM

## 2019-05-05 DIAGNOSIS — Z510 Encounter for antineoplastic radiation therapy: Secondary | ICD-10-CM | POA: Diagnosis not present

## 2019-05-05 NOTE — Patient Instructions (Signed)
Medication Instructions:  - Your physician recommends that you continue on your current medications as directed. Please refer to the Current Medication list given to you today.  *If you need a refill on your cardiac medications before your next appointment, please call your pharmacy*  Lab Work: - none ordered  If you have labs (blood work) drawn today and your tests are completely normal, you will receive your results only by: . MyChart Message (if you have MyChart) OR . A paper copy in the mail If you have any lab test that is abnormal or we need to change your treatment, we will call you to review the results.  Testing/Procedures: - none ordered  Follow-Up: At [facility], you and your health needs are our priority.  As part of our continuing mission to provide you with exceptional heart care, we have created designated Provider Care Teams.  These Care Teams include your primary Cardiologist (physician) and Advanced Practice Providers (APPs -  Physician Assistants and Nurse Practitioners) who all work together to provide you with the care you need, when you need it.  Your next appointment:   1 year(s)  The format for your next appointment:   In Person  Provider:   Steven Klein, MD  Other Instructions - n/a  

## 2019-05-05 NOTE — Telephone Encounter (Signed)
Spoke with the patient's son and she is now scheduled for port placement on 05/14/19 with Dr. Lucky Cowboy with a 8:15 am arrival time to the MM. Patient is already scheduled for a covid test on 05/08/19 and the son will ask if she needs to do another before her procedure on 05/14/19. Pre-procedure instructions were discussed and will be mailed to the patient.

## 2019-05-05 NOTE — Progress Notes (Signed)
ELECTROPHYSIOLOGY OFFICE NOTE  Patient ID: Megan Shelton, MRN: [id number], DOB/AGE: [birth year] 79 y.o. Admit date: (Not on file) Date of Consult: 05/05/2019  Primary Physician: Mar Daring, PA-C Primary Cardiologist: nEW     Megan Shelton is a 79 y.o. female who is being seen today for the evaluation of previously implanted ICD    HPI Megan Shelton is a 79 y.o. female  Recently moved from [place].  She has an ICD implanted originally 2008 with generator replacement 2016 (Medtronic).  She developed congestive heart failure 2008 in the setting of a out of hospital MI.  She underwent stenting of a high-grade circumflex lesion with a total of the LAD.  Something happened while in hospital at the [facility] that prompted them to implant an ICD.  It has never delivered therapy.  She has hypertension and diabetes.  She denies sleep apnea.   She has had progressive shortness of breath.  She has intercurrently been diagnosed with stage IV lung cancer; a PET scan has raised the possibility of intracranial metastatic disease as well.  She is scheduled for a lung biopsy.  No peripheral edema.  No syncope.  DATE TEST EF   2008 Cath  20-25 %   9/17 Echo   35 %   7/18 Echo  55%    Date Cr K Hgb  1/21  0.4 3.6 14.6           Surgical History:  Past Surgical History:  Procedure Laterality Date  . CARPAL TUNNEL RELEASE    . cataract surgery    . EYE SURGERY       Home Meds: Prior to Admission medications   Medication Sig Start Date End Date Taking? Authorizing Provider  aspirin 81 MG tablet Take 81 mg by mouth daily.   Yes [provider]  atorvastatin (LIPITOR) 40 MG tablet Take 40 mg by mouth daily.   Yes [provider]  clonazePAM (KLONOPIN) 1 MG tablet Take 1 tablet (1 mg total) by mouth 3 (three) times daily as needed for anxiety. 05/16/17  Yes Mar Daring, PA-C  Coenzyme Q10 (COQ10 PO) Take 1 capsule by mouth  daily.   Yes [provider]  glimepiride (AMARYL) 1 MG tablet Take 1 mg by mouth 2 (two) times daily.   Yes [provider]  levothyroxine (SYNTHROID, LEVOTHROID) 112 MCG tablet Take 112 mcg by mouth daily before breakfast.   Yes [provider]  lisinopril (PRINIVIL,ZESTRIL) 10 MG tablet Take 10 mg by mouth daily.   Yes [provider]  metFORMIN (GLUCOPHAGE-XR) 500 MG 24 hr tablet Take 500 mg by mouth. Take 1 tablet by mouth in the mornings, and 2 tablets by mouth in the evenings.   Yes [provider]  metoprolol tartrate (LOPRESSOR) 25 MG tablet Take 1 tablet (25 mg total) by mouth 2 (two) times daily. 05/03/17  Yes Mar Daring, PA-C  sitaGLIPtin (JANUVIA) 100 MG tablet Take 100 mg by mouth daily.   Yes [provider]  vitamin B-12 (CYANOCOBALAMIN) 100 MCG tablet Take 100 mcg by mouth daily.   Yes [provider]    Allergies:  Allergies  Allergen Reactions  . Latex Rash  . Nickel Rash    Social History   Socioeconomic History  . Marital status: Widowed    Spouse name: Not on file  . Number of children: 3  . Years of education: Not on file  . Highest education  level: High school graduate  Occupational History  . Occupation: retired  Tobacco Use  . Smoking status: Former Smoker    Packs/day: 1.00    Years: 55.00    Pack years: 55.00    Types: Cigarettes    Quit date: 2006    Years since quitting: 15.0  . Smokeless tobacco: Never Used  . Tobacco comment: quit about 18 years ago; as of 2019  Substance and Sexual Activity  . Alcohol use: No  . Drug use: No  . Sexual activity: Not on file  Other Topics Concern  . Not on file  Social History Narrative  . Not on file   Social Determinants of Health   Financial Resource Strain:   . Difficulty of Paying Living Expenses: Not on file  Food Insecurity:   . Worried About Charity fundraiser in the Last Year: Not on file  . Ran Out of Food in the Last  Year: Not on file  Transportation Needs:   . Lack of Transportation (Medical): Not on file  . Lack of Transportation (Non-Medical): Not on file  Physical Activity: Inactive  . Days of Exercise per Week: 0 days  . Minutes of Exercise per Session: 0 min  Stress: No Stress Concern Present  . Feeling of Stress : Not at all  Social Connections: Unknown  . Frequency of Communication with Friends and Family: Patient refused  . Frequency of Social Gatherings with Friends and Family: Patient refused  . Attends Religious Services: Patient refused  . Active Member of Clubs or Organizations: Patient refused  . Attends Archivist Meetings: Patient refused  . Marital Status: Patient refused  Intimate Partner Violence: Unknown  . Fear of Current or Ex-Partner: Patient refused  . Emotionally Abused: Patient refused  . Physically Abused: Patient refused  . Sexually Abused: Patient refused     Family History  Problem Relation Age of Onset  . Scleroderma Mother   . Raynaud syndrome Mother   . Cancer Father 2       colon cancer  . Heart disease Brother        died of heart attack  . Diabetes Brother      ROS:  Please see the history of present illness.     All other systems reviewed and negative.   BP 140/80 (BP Location: Right Arm, Patient Position: Sitting, Cuff Size: Normal)   Pulse 78   Ht 5' (1.524 m)   Wt 161 lb 8 oz (73.3 kg)   SpO2 96%   BMI 31.54 kg/m   Well developed and well nourished in no acute distress HENT normal Neck supple with JVP 6-7 cm Clear Device pocket well healed; without hematoma or erythema.  There is no tethering  Regular rate and rhythm, no  gallop No  murmur Abd-soft with active BS No Clubbing cyanosis  edema Skin-warm and dry A & Oriented  Grossly normal sensory and motor function  ECG sinus rhythm at 78 Interval 16/10/37 RSR prime Septal infarct  Assessment and Plan: Ischemic cardiomyopathy with interval normalization of LV  function  Implantable defibrillator-primary prevention (presumed) Medtronic   Congestive heart failure-chronic-mixed class    Lung cancer stage IV   Device function is normal.  No symptoms of ischemia; there has been some shortness of breath.  In the absence of chest pain, I would presume that it is related to her lung cancer.  Lung biopsy is the next step.  I do not see any cardiac contraindications  to this.  We did have a lengthy discussion regarding end-of-life and the timing of turning off her defibrillator when she has decided that if her heart were to stop she is ready to die.  Her device is not MRI compatible   She had her son both articulate understanding  Discussed with Dr. Patsey Berthold  We spent more than 50% of our >25 min visit in face to face counseling regarding the above   Virl Axe

## 2019-05-06 ENCOUNTER — Ambulatory Visit
Admission: RE | Admit: 2019-05-06 | Discharge: 2019-05-06 | Disposition: A | Discharge status: Home / Self Care | Payer: Medicare Other | Source: Ambulatory Visit | Attending: Radiation Oncology | Admitting: Radiation Oncology

## 2019-05-06 ENCOUNTER — Other Ambulatory Visit: Payer: Self-pay

## 2019-05-06 DIAGNOSIS — Z510 Encounter for antineoplastic radiation therapy: Secondary | ICD-10-CM | POA: Diagnosis not present

## 2019-05-06 DIAGNOSIS — C7951 Secondary malignant neoplasm of bone: Secondary | ICD-10-CM | POA: Diagnosis not present

## 2019-05-07 ENCOUNTER — Encounter
Admission: RE | Admit: 2019-05-07 | Discharge: 2019-05-07 | Disposition: A | Discharge status: Home / Self Care | Payer: Medicare Other | Source: Ambulatory Visit | Attending: Pulmonary Disease | Admitting: Pulmonary Disease

## 2019-05-07 ENCOUNTER — Other Ambulatory Visit: Payer: Medicare Other

## 2019-05-07 ENCOUNTER — Ambulatory Visit
Admission: RE | Admit: 2019-05-07 | Discharge: 2019-05-07 | Disposition: A | Discharge status: Home / Self Care | Payer: Medicare Other | Source: Ambulatory Visit | Attending: Radiation Oncology | Admitting: Radiation Oncology

## 2019-05-07 ENCOUNTER — Other Ambulatory Visit: Payer: Self-pay

## 2019-05-07 DIAGNOSIS — Z510 Encounter for antineoplastic radiation therapy: Secondary | ICD-10-CM | POA: Diagnosis not present

## 2019-05-07 DIAGNOSIS — C7951 Secondary malignant neoplasm of bone: Secondary | ICD-10-CM | POA: Diagnosis not present

## 2019-05-07 HISTORY — DX: Dyspnea, unspecified: R06.00

## 2019-05-07 HISTORY — DX: Presence of automatic (implantable) cardiac defibrillator: Z95.810

## 2019-05-07 HISTORY — DX: Essential (primary) hypertension: I10

## 2019-05-07 HISTORY — DX: Heart failure, unspecified: I50.9

## 2019-05-07 HISTORY — DX: Anxiety disorder, unspecified: F41.9

## 2019-05-07 NOTE — Patient Instructions (Signed)
Your procedure is scheduled on: 05/11/19 Report to [place]. To find out your arrival time please call [phone] between 1PM - 3PM on 05/08/19.  Remember: Instructions that are not followed completely may result in serious medical risk, up to and including death, or upon the discretion of your surgeon and anesthesiologist your surgery may need to be rescheduled.     _X__ 1. Do not eat food after midnight the night before your procedure.                 No gum chewing or hard candies. You may drink clear liquids up to 2 hours                 before you are scheduled to arrive for your surgery- DO not drink clear                 liquids within 2 hours of the start of your surgery.                 Clear Liquids include:  water, apple juice without pulp, clear carbohydrate                 drink such as Clearfast or Gatorade, Black Coffee or Tea (Do not add                 anything to coffee or tea). Diabetics water only  __X__2.  On the morning of surgery brush your teeth with toothpaste and water, you                 may rinse your mouth with mouthwash if you wish.  Do not swallow any              toothpaste of mouthwash.     _X__ 3.  No Alcohol for 24 hours before or after surgery.   _X__ 4.  Do Not Smoke or use e-cigarettes For 24 Hours Prior to Your Surgery.                 Do not use any chewable tobacco products for at least 6 hours prior to                 surgery.  ____  5.  Bring all medications with you on the day of surgery if instructed.   __X__  6.  Notify your doctor if there is any change in your medical condition      (cold, fever, infections).     Do not wear jewelry, make-up, hairpins, clips or nail polish. Do not wear lotions, powders, or perfumes.  Do not shave 48 hours prior to surgery. Men may shave face and neck. Do not bring valuables to the hospital.    [facility] is not responsible for any belongings or  valuables.  Contacts, dentures/partials or body piercings may not be worn into surgery. Bring a case for your contacts, glasses or hearing aids, a denture cup will be supplied. Leave your suitcase in the car. After surgery it may be brought to your room. For patients admitted to the hospital, discharge time is determined by your treatment team.   Patients discharged the day of surgery will not be allowed to drive home.   Please read over the following fact sheets that you were given:   MRSA Information  __X__ Take these medicines the morning of surgery with A SIP OF WATER:  1. LEVOTHYROXINE  2. METOPROLOL  3. MAY TAKE OXYCODONE FOR PAIN IF NEEDED  4. mAY TAKE CLONAZEPAM FOR ANXIETY IF NEEDED  5.  6.  ____ Fleet Enema (as directed)   __X__ Use CHG Soap/SAGE wipes as directed  ____ Use inhalers on the day of surgery  ____ Stop metformin/Janumet/Farxiga 2 days prior to surgery    ____ Take 1/2 of usual insulin dose the night before surgery. No insulin the morning          of surgery.   ____ Stop Blood Thinners Coumadin/Plavix/Xarelto/Pleta/Pradaxa/Eliquis/Effient/Aspirin  on   Or contact your Surgeon, Cardiologist or Medical Doctor regarding  ability to stop your blood thinners  __X__ Stop Anti-inflammatories 7 days before surgery such as Advil, Ibuprofen, Motrin,  BC or Goodies Powder, Naprosyn, Naproxen, Aleve, Aspirin    __X__ Stop all herbal supplements, fish oil or vitamin E until after surgery.    ____ Bring C-Pap to the hospital.

## 2019-05-08 ENCOUNTER — Ambulatory Visit
Admission: RE | Admit: 2019-05-08 | Discharge: 2019-05-08 | Disposition: A | Discharge status: Home / Self Care | Payer: Medicare Other | Source: Ambulatory Visit | Attending: Oncology | Admitting: Oncology

## 2019-05-08 ENCOUNTER — Other Ambulatory Visit: Payer: Self-pay

## 2019-05-08 ENCOUNTER — Other Ambulatory Visit
Admission: RE | Admit: 2019-05-08 | Discharge: 2019-05-08 | Disposition: A | Discharge status: Home / Self Care | Payer: Medicare Other | Source: Ambulatory Visit | Attending: Pulmonary Disease | Admitting: Pulmonary Disease

## 2019-05-08 ENCOUNTER — Other Ambulatory Visit
Admission: RE | Admit: 2019-05-08 | Discharge: 2019-05-08 | Disposition: A | Discharge status: Home / Self Care | Payer: Medicare Other | Source: Ambulatory Visit | Attending: Radiation Oncology | Admitting: Radiation Oncology

## 2019-05-08 ENCOUNTER — Ambulatory Visit
Admission: RE | Admit: 2019-05-08 | Discharge: 2019-05-08 | Disposition: A | Discharge status: Home / Self Care | Payer: Medicare Other | Source: Ambulatory Visit | Attending: Radiation Oncology | Admitting: Radiation Oncology

## 2019-05-08 ENCOUNTER — Inpatient Hospital Stay (HOSPITAL_BASED_OUTPATIENT_CLINIC_OR_DEPARTMENT_OTHER): Payer: Medicare Other | Admitting: Oncology

## 2019-05-08 ENCOUNTER — Encounter: Payer: Self-pay | Admitting: Oncology

## 2019-05-08 DIAGNOSIS — I493 Ventricular premature depolarization: Secondary | ICD-10-CM | POA: Diagnosis not present

## 2019-05-08 DIAGNOSIS — R042 Hemoptysis: Secondary | ICD-10-CM | POA: Diagnosis not present

## 2019-05-08 DIAGNOSIS — E039 Hypothyroidism, unspecified: Secondary | ICD-10-CM | POA: Diagnosis not present

## 2019-05-08 DIAGNOSIS — I2582 Chronic total occlusion of coronary artery: Secondary | ICD-10-CM | POA: Diagnosis not present

## 2019-05-08 DIAGNOSIS — I5022 Chronic systolic (congestive) heart failure: Secondary | ICD-10-CM | POA: Diagnosis not present

## 2019-05-08 DIAGNOSIS — C3432 Malignant neoplasm of lower lobe, left bronchus or lung: Secondary | ICD-10-CM | POA: Diagnosis not present

## 2019-05-08 DIAGNOSIS — Z66 Do not resuscitate: Secondary | ICD-10-CM | POA: Diagnosis not present

## 2019-05-08 DIAGNOSIS — I2510 Atherosclerotic heart disease of native coronary artery without angina pectoris: Secondary | ICD-10-CM | POA: Diagnosis not present

## 2019-05-08 DIAGNOSIS — C7951 Secondary malignant neoplasm of bone: Secondary | ICD-10-CM

## 2019-05-08 DIAGNOSIS — Z510 Encounter for antineoplastic radiation therapy: Secondary | ICD-10-CM | POA: Diagnosis not present

## 2019-05-08 DIAGNOSIS — I472 Ventricular tachycardia: Secondary | ICD-10-CM | POA: Diagnosis not present

## 2019-05-08 DIAGNOSIS — C7931 Secondary malignant neoplasm of brain: Secondary | ICD-10-CM

## 2019-05-08 DIAGNOSIS — I110 Hypertensive heart disease with heart failure: Secondary | ICD-10-CM | POA: Diagnosis not present

## 2019-05-08 DIAGNOSIS — I255 Ischemic cardiomyopathy: Secondary | ICD-10-CM | POA: Diagnosis not present

## 2019-05-08 DIAGNOSIS — R942 Abnormal results of pulmonary function studies: Secondary | ICD-10-CM | POA: Insufficient documentation

## 2019-05-08 DIAGNOSIS — R062 Wheezing: Secondary | ICD-10-CM | POA: Diagnosis not present

## 2019-05-08 DIAGNOSIS — Z01812 Encounter for preprocedural laboratory examination: Secondary | ICD-10-CM | POA: Insufficient documentation

## 2019-05-08 DIAGNOSIS — E0590 Thyrotoxicosis, unspecified without thyrotoxic crisis or storm: Secondary | ICD-10-CM | POA: Diagnosis not present

## 2019-05-08 DIAGNOSIS — R918 Other nonspecific abnormal finding of lung field: Secondary | ICD-10-CM

## 2019-05-08 DIAGNOSIS — I480 Paroxysmal atrial fibrillation: Secondary | ICD-10-CM | POA: Diagnosis not present

## 2019-05-08 DIAGNOSIS — E1140 Type 2 diabetes mellitus with diabetic neuropathy, unspecified: Secondary | ICD-10-CM | POA: Diagnosis not present

## 2019-05-08 DIAGNOSIS — Z20822 Contact with and (suspected) exposure to covid-19: Secondary | ICD-10-CM | POA: Insufficient documentation

## 2019-05-08 DIAGNOSIS — Z96652 Presence of left artificial knee joint: Secondary | ICD-10-CM | POA: Diagnosis not present

## 2019-05-08 DIAGNOSIS — C3412 Malignant neoplasm of upper lobe, left bronchus or lung: Secondary | ICD-10-CM | POA: Diagnosis not present

## 2019-05-08 DIAGNOSIS — I471 Supraventricular tachycardia: Secondary | ICD-10-CM | POA: Diagnosis not present

## 2019-05-08 DIAGNOSIS — E785 Hyperlipidemia, unspecified: Secondary | ICD-10-CM | POA: Diagnosis not present

## 2019-05-08 LAB — SARS CORONAVIRUS 2 (TAT 6-24 HRS): SARS Coronavirus 2: NEGATIVE

## 2019-05-08 LAB — CBC
HCT: 44.5 % (ref 36.0–46.0)
Hemoglobin: 14.4 g/dL (ref 12.0–15.0)
MCH: 27.2 pg (ref 26.0–34.0)
MCHC: 32.4 g/dL (ref 30.0–36.0)
MCV: 84 fL (ref 80.0–100.0)
Platelets: 232 10*3/uL (ref 150–400)
RBC: 5.3 MIL/uL — ABNORMAL HIGH (ref 3.87–5.11)
RDW: 14.8 % (ref 11.5–15.5)
WBC: 9.5 10*3/uL (ref 4.0–10.5)
nRBC: 0 % (ref 0.0–0.2)

## 2019-05-08 MED ORDER — IOHEXOL 300 MG/ML  SOLN
75.0000 mL | Freq: Once | INTRAMUSCULAR | Status: AC | PRN
  Administered 2019-05-08: 75 mL via INTRAVENOUS

## 2019-05-08 MED ORDER — OXYCODONE HCL 5 MG PO TABS: 5.0000 mg | ORAL_TABLET | ORAL | 0 refills | Status: AC | PRN

## 2019-05-08 MED ORDER — DEXAMETHASONE 4 MG PO TABS: 4.0000 mg | ORAL_TABLET | Freq: Two times a day (BID) | ORAL | 0 refills | Status: DC

## 2019-05-08 MED ORDER — ONDANSETRON 4 MG PO TBDP: 4.0000 mg | ORAL_TABLET | Freq: Three times a day (TID) | ORAL | 1 refills | Status: DC | PRN

## 2019-05-10 NOTE — Progress Notes (Signed)
I connected with Megan Shelton on 05/10/19 at 11:00 AM EST by video enabled telemedicine visit and verified that I am speaking with the correct person using two identifiers.   I discussed the limitations, risks, security and privacy concerns of performing an evaluation and management service by telemedicine and the availability of in-person appointments. I also discussed with the patient that there may be a patient responsible charge related to this service. The patient expressed understanding and agreed to proceed.  Other persons participating in the visit and their role in the encounter:  Patients son/ daughter in law  Patient's location:  home Provider's location:  work   Diagnosis- metastatic lung cancer  Chief complaint/ Reason for visit- discuss brain MRI results and further management  Heme/Onc history: patient is a 79 year old female with a past medical history significant for congestive heart failure with AICD placement, history of thyroid cancer s/p thyroidectomy. She was also a chronic smoker but quit smoking in 2006. More recently patient presented with pain in her left arm which led to an x-ray. X-ray showed comminuted possibly pathologic midshaft humeral fracture. This was followed by a whole-body bone scan which showed multiple sites of abnormal tracer uptake involving left scapula, bilateral posterior ribs, thoracic and lumbar spine, pelvis and multiple sites in the proximal and distal left femur as well as left humeral diaphysis.   PET CT scan shows 4 cm left retrohilar mass in the upper lobe but invading across the major fissure into the left lower lobe with an SUV of 9.1.  Scattered osseous metastasis with humeral pathological fracture.  Bronchoscopy scheduled for 05/11/19. Brain CT showed 11 mm left frontal lobe mass with mild edema consistent with metastatic disease. 2. Two questionable additional lesions measuring 3 mm in the right frontal lobe and at the right  temporoparietal junction. 3. Indeterminate 5 mm left frontal skull lesion.   Interval history she feels overwhelmed with acute events. Feels fatigued. Has left arm pain   Review of Systems  Constitutional: Positive for malaise/fatigue. Negative for chills, fever and weight loss.  HENT: Negative for congestion, ear discharge and nosebleeds.   Eyes: Negative for blurred vision.  Respiratory: Negative for cough, hemoptysis, sputum production, shortness of breath and wheezing.   Cardiovascular: Negative for chest pain, palpitations, orthopnea and claudication.  Gastrointestinal: Negative for abdominal pain, blood in stool, constipation, diarrhea, heartburn, melena, nausea and vomiting.  Genitourinary: Negative for dysuria, flank pain, frequency, hematuria and urgency.  Musculoskeletal: Negative for back pain, joint pain and myalgias.       Left arm pain  Skin: Negative for rash.  Neurological: Negative for dizziness, tingling, focal weakness, seizures, weakness and headaches.  Endo/Heme/Allergies: Does not bruise/bleed easily.  Psychiatric/Behavioral: Negative for depression and suicidal ideas. The patient does not have insomnia.     Allergies  Allergen Reactions  . Latex Rash  . Nickel Rash    Past Medical History:  Diagnosis Date  . AICD (automatic cardioverter/defibrillator) present   . Anxiety   . CHF (congestive heart failure) ([place])   . Diabetes mellitus with neuropathy ([place])   . Dyspnea   . Hypertension   . Hypertensive heart disease   . Implantable cardioverter-defibrillator (ICD)-[employer]   . Myocardial infarction, anterior wall ([place])   . Obesity, Class II, BMI 35-39.9     Past Surgical History:  Procedure Laterality Date  . CARPAL TUNNEL RELEASE    . cataract surgery    . EYE SURGERY    . FRACTURE SURGERY Left  ARM  . JOINT REPLACEMENT Left    tkr    Social History   Socioeconomic History  . Marital status: Widowed    Spouse name: Not on file  .  Number of children: 3  . Years of education: Not on file  . Highest education level: High school graduate  Occupational History  . Occupation: retired  Tobacco Use  . Smoking status: Former Smoker    Packs/day: 1.00    Years: 55.00    Pack years: 55.00    Types: Cigarettes    Quit date: 2006    Years since quitting: 15.0  . Smokeless tobacco: Never Used  . Tobacco comment: quit about 18 years ago; as of 2019  Substance and Sexual Activity  . Alcohol use: No  . Drug use: No  . Sexual activity: Not on file  Other Topics Concern  . Not on file  Social History Narrative  . Not on file   Social Determinants of Health   Financial Resource Strain:   . Difficulty of Paying Living Expenses: Not on file  Food Insecurity:   . Worried About Charity fundraiser in the Last Year: Not on file  . Ran Out of Food in the Last Year: Not on file  Transportation Needs:   . Lack of Transportation (Medical): Not on file  . Lack of Transportation (Non-Medical): Not on file  Physical Activity: Inactive  . Days of Exercise per Week: 0 days  . Minutes of Exercise per Session: 0 min  Stress: No Stress Concern Present  . Feeling of Stress : Not at all  Social Connections: Unknown  . Frequency of Communication with Friends and Family: Patient refused  . Frequency of Social Gatherings with Friends and Family: Patient refused  . Attends Religious Services: Patient refused  . Active Member of Clubs or Organizations: Patient refused  . Attends Archivist Meetings: Patient refused  . Marital Status: Patient refused  Intimate Partner Violence: Unknown  . Fear of Current or Ex-Partner: Patient refused  . Emotionally Abused: Patient refused  . Physically Abused: Patient refused  . Sexually Abused: Patient refused    Family History  Problem Relation Age of Onset  . Scleroderma Mother   . Raynaud syndrome Mother   . Cancer Father 68       colon cancer  . Heart disease Brother         died of heart attack  . Diabetes Brother      Current Outpatient Medications:  .  atorvastatin (LIPITOR) 40 MG tablet, TAKE 1 TABLET BY MOUTH  DAILY (Patient taking differently: Take 40 mg by mouth daily. ), Disp: 90 tablet, Rfl: 3 .  Blood Glucose Monitoring Suppl (ACCU-CHEK COMPACT CARE KIT) KIT, To check blood sugar daily., Disp: 1 kit, Rfl: 0 .  Blood Glucose Monitoring Suppl (ACCU-CHEK GUIDE) w/Device KIT, , Disp: , Rfl:  .  clonazePAM (KLONOPIN) 1 MG tablet, TAKE 1 TABLET BY MOUTH 3 TIMES A DAY AS NEEDED FOR ANXIETY (Patient taking differently: Take 1 mg by mouth 3 (three) times daily as needed for anxiety. ), Disp: 90 tablet, Rfl: 5 .  furosemide (LASIX) 40 MG tablet, TAKE 1 TABLET BY MOUTH EVERY DAY AS NEEDED (Patient taking differently: Take 40 mg by mouth daily as needed for fluid. ), Disp: 90 tablet, Rfl: 1 .  glimepiride (AMARYL) 1 MG tablet, TAKE 1 TABLET BY MOUTH TWO  TIMES DAILY, Disp: 180 tablet, Rfl: 3 .  glucose blood (ACCU-CHEK  COMPACT PLUS) test strip, To check blood sugar daily., Disp: 100 each, Rfl: 12 .  hydrocortisone 1 % ointment, Apply 1 application topically 2 (two) times daily. May use for skin irritation on bilateral eyelids. Do not get inside of eyes. (Patient taking differently: Apply 1 application topically 2 (two) times daily as needed for itching. May use for skin irritation on bilateral eyelids. Do not get inside of eyes.), Disp: 30 g, Rfl: 0 .  ibuprofen (ADVIL) 800 MG tablet, Take 800 mg by mouth every 8 (eight) hours as needed for moderate pain. , Disp: , Rfl:  .  Lancets (ACCU-CHEK SOFT TOUCH) lancets, To check blood sugar daily., Disp: 100 each, Rfl: 12 .  levothyroxine (SYNTHROID) 112 MCG tablet, TAKE 1 TABLET BY MOUTH  DAILY BEFORE BREAKFAST (Patient taking differently: Take 112 mcg by mouth daily before breakfast. ), Disp: 90 tablet, Rfl: 3 .  lisinopril (ZESTRIL) 10 MG tablet, TAKE 1 TABLET BY MOUTH  DAILY (Patient taking differently: Take 10 mg by mouth  daily. ), Disp: 90 tablet, Rfl: 3 .  metFORMIN (GLUCOPHAGE-XR) 500 MG 24 hr tablet, TAKE 1 TABLET BY MOUTH IN  THE MORNING AND 2 TABLETS  IN THE EVENING (Patient taking differently: Take 500 mg by mouth 2 (two) times daily. ), Disp: 270 tablet, Rfl: 3 .  metoprolol tartrate (LOPRESSOR) 25 MG tablet, TAKE 1 TABLET BY MOUTH TWO  TIMES DAILY (Patient taking differently: Take 25 mg by mouth 2 (two) times daily. ), Disp: 180 tablet, Rfl: 3 .  ondansetron (ZOFRAN ODT) 4 MG disintegrating tablet, Take 1 tablet (4 mg total) by mouth every 8 (eight) hours as needed for nausea or vomiting., Disp: 60 tablet, Rfl: 1 .  ONETOUCH DELICA LANCETS 65Y MISC, CHECK BLOOD SUGAR ONCE  DAILY, Disp: 100 each, Rfl: 11 .  oxyCODONE (OXY IR/ROXICODONE) 5 MG immediate release tablet, Take 1 tablet (5 mg total) by mouth every 4 (four) hours as needed for severe pain., Disp: 60 tablet, Rfl: 0 .  pyridOXINE (VITAMIN B-6) 100 MG tablet, Take 100 mg by mouth daily., Disp: , Rfl:  .  sitaGLIPtin (JANUVIA) 100 MG tablet, Take 100 mg by mouth daily., Disp: , Rfl:  .  vitamin B-12 (CYANOCOBALAMIN) 100 MCG tablet, Take 100 mcg by mouth daily., Disp: , Rfl:  .  dexamethasone (DECADRON) 4 MG tablet, Take 1 tablet (4 mg total) by mouth 2 (two) times daily with a meal., Disp: 60 tablet, Rfl: 0  CT Head W Wo Contrast  Result Date: 05/08/2019 CLINICAL DATA:  Abnormal PET-CT with a left lung mass, bone metastases, and hypodensity in the left cerebral hemisphere. EXAM: CT HEAD WITHOUT AND WITH CONTRAST TECHNIQUE: Contiguous axial images were obtained from the base of the skull through the vertex without and with intravenous contrast CONTRAST:  81m OMNIPAQUE IOHEXOL 300 MG/ML  SOLN COMPARISON:  PET-CT 04/27/2019 FINDINGS: Brain: There is no evidence of acute infarct, intracranial hemorrhage, midline shift, or extra-axial fluid collection. A chronic left parietal infarct is noted. Bilateral cerebral white matter hypodensities are nonspecific but  compatible with mild chronic small vessel ischemic disease. Mild cerebral atrophy is not greater than expected for age. There is an 11 x 8 mm enhancing mass anteriorly in the left frontal lobe with mild edema. Additional enhancing lesions are questioned in the right frontal lobe measuring 3 mm (series 4, image 16 and series 6, image 13) and at the right temporoparietal junction measuring 3 mm (series 6, image 10). Vascular: Calcified atherosclerosis at the  skull base. Patent dural venous sinuses. Skull: 5 mm lucent lesion in the left frontal skull (series 3, image 58). Sinuses/Orbits: Mild mucosal thickening or small volume fluid in the ethmoid and sphenoid sinuses bilaterally. Clear mastoid air cells. Unremarkable visualized orbits. Other: None. IMPRESSION: 1. 11 mm left frontal lobe mass with mild edema consistent with metastatic disease. 2. Two questionable additional lesions measuring 3 mm in the right frontal lobe and at the right temporoparietal junction. 3. Indeterminate 5 mm left frontal skull lesion. 4. 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 Electronically Signed   By: Logan Bores M.D.   On: 05/08/2019 08:50   NM Bone Scan Whole Body  Result Date: 04/15/2019 CLINICAL DATA:  Pathologic fracture LEFT humerus, follow-up EXAM: NUCLEAR MEDICINE WHOLE BODY BONE SCAN TECHNIQUE: Whole body anterior and posterior images were obtained approximately 3 hours after intravenous injection of radiopharmaceutical. RADIOPHARMACEUTICALS:  22.005 mCi Technetium-43mMDP IV COMPARISON:  None Radiographic correlation: LEFT humeral radiographs 02/14/2019 FINDINGS: Multiple sites of abnormal osseous tracer uptake are identified consistent with osseous metastatic disease. These include proximal humeri, tip of LEFT scapula, BILATERAL posterior ribs, thoracic and lumbar spine, pelvis, and multiple sites in the proximal and distal LEFT femur. Intense uptake at mid LEFT humeral diaphysis  corresponding to known pathologic fracture. Degenerative type uptake at RIGHT knee with note of a LEFT knee prosthesis. Photopenic region at the upper anterior LEFT chest corresponding to pacemaker generator. Expected urinary tract and soft tissue distribution of tracer. IMPRESSION: Multiple sites of abnormal tracer uptake consistent with osseous metastatic disease. These include sites within the humeri bilaterally and within the LEFT femur; radiographs of the LEFT femur and RIGHT humerus recommended to exclude metastatic lesions at risk for pathologic fracture. Electronically Signed   By: MLavonia DanaM.D.   On: 04/15/2019 08:34   NM PET Image Initial (PI) Whole Body  Result Date: 04/27/2019 CLINICAL DATA:  Initial treatment strategy for pathologic fracture of left humerus and other sites in the skeleton, assessment for primary. EXAM: NUCLEAR MEDICINE PET WHOLE BODY TECHNIQUE: 7.7 mCi F-18 FDG was injected intravenously. Full-ring PET imaging was performed from the skull base to thigh after the radiotracer. CT data was obtained and used for attenuation correction and anatomic localization. Fasting blood glucose: 164 mg/dl COMPARISON:  Serve multiple whole-body bone scan from 04/14/2019 FINDINGS: Mediastinal blood pool activity: SUV max 3.1 HEAD/NECK: Reduced activity and some local hypodensity in the left parietal lobe could be from an old stroke or underlying lesion. There is also some asymmetric hypodensity in the left frontal white matter which could be from asymmetric chronic ischemic microvascular white matter disease or vasogenic edema. If the patient's pacer/AICD prevents MRI, dedicated CT of the brain with and without contrast would be recommended. If the device is MRI compatible, then MRI brain with and without contrast would be preferred. Incidental CT findings: Bilateral common carotid atherosclerotic calcification. Acute right and chronic left sphenoid sinusitis with mild chronic ethmoid sinusitis.  CHEST: Left upper lobe retro hilar mass measures approximately 4.0 by 3.0 cm on image 112/3, maximum SUV 9.1. This probably invades across the fissure into the superior segment left lower lobe. A variety of nodules are present bilaterally which are not appreciably hypermetabolic but are below sensitive PET-CT size thresholds. This includes a 7 mm ground-glass density nodule in the apical segment right upper lobe on image 96/3; a 4 mm right basilar nodule on image 140/3; a 4 mm lingular nodule on image 128/3; a  0.7 by 0.6 cm left lower lobe nodule on image 135/3; and a 0.4 cm left lower lobe nodule on image 140/3. Incidental CT findings: Coronary, aortic arch, and branch vessel atherosclerotic vascular disease. Pacer/AICD noted. Mild cardiomegaly. Subcoracoid bursitis on the right. ABDOMEN/PELVIS: Focal anal activity, maximum SUV 7.3, probably physiologic but technically nonspecific. Incidental CT findings: Aortoiliac atherosclerotic vascular disease. Probable 3 mm right mid kidney nonobstructive renal calculus. Sigmoid diverticulosis. Uterine fibroids. SKELETON: Scattered skeletal metastatic disease including the pathologic fracture in the left mid humerus, with maximum SUV 7.7. Metastatic lesions are identified in the thoracic vertebra (for example a T1 right eccentric lesion with maximum SUV 5.9) in the ribs (for example a medial right sixth rib lesion maximum SUV 5.1, lumbar spine, left upper acetabulum (a lytic lesion measuring 3.2 cm in diameter with maximum SUV 7.1) right acetabulum, left proximal femur (maximum SUV 6.0) distally in the left femur; likely in the left medial femoral condyle posteriorly where there is maximum SUV of 6.9; and in the right proximal humeral metaphysis (maximum SUV 6.0). Incidental CT findings: Incidental failure of fusion of the posterior arch of C1. Chronic bilateral pars defects at L5 with grade 2 anterolisthesis of L5 on S1. EXTREMITIES: No significant abnormal hypermetabolic  activity in this region. Incidental CT findings: Incidental left iliopsoas lipoma. Left total knee prosthesis. Osteoarthritis of the right knee. SFA and popliteal artery atherosclerotic calcifications. IMPRESSION: 1. 4 cm left retro hilar mass primarily in the left upper lobe but probably invading across the major fissure into the superior segment left lower lobe, maximum SUV 9.1, compatible with malignancy and likely reflecting lung primary for the metastatic disease. 2. Scattered osseous metastatic lesions including the left mid humeral pathologic fracture, as detailed above. One notable lesion is the lytic left acetabular metastatic lesion measuring 3.2 cm in diameter with maximum SUV 7.1. 3. Two areas of hypodensity in the left cerebral hemisphere could be residua from prior stroke or chronic microvascular white matter disease, but intracranial metastatic disease is not totally excluded. MRI brain with and without contrast would be preferred but the patient has a pacer/AICD device; if this device is not MRI compatible, then consider CT of the head with and without contrast for further characterization. 4. Focal activity along the anus is probably physiologic but technically nonspecific. 5. Other imaging findings of potential clinical significance: Acute and chronic paranasal sinusitis. Aortic Atherosclerosis (ICD10-I70.0). Coronary atherosclerosis. Mild cardiomegaly. Right subcoracoid bursitis. Probable nonobstructive right nephrolithiasis. Sigmoid diverticulosis. Uterine fibroids. Chronic pars defects at L5 with grade 2 anterolisthesis. Electronically Signed   By: Van Clines M.D.   On: 04/27/2019 13:44   DG Humerus Right  Result Date: 04/20/2019 CLINICAL DATA:  Osseous metastatic disease. EXAM: RIGHT HUMERUS - 2+ VIEW COMPARISON:  Bone scan dated April 14, 2019. FINDINGS: 1.7 cm lucent lesion in the proximal humeral diaphysis. No additional focal bone lesion. No acute fracture or dislocation. Mild  degenerative changes of the shoulder and elbow. IMPRESSION: 1. 1.7 cm lucent lesion in the proximal humeral diaphysis corresponding to the area of abnormal uptake on recent bone scan, consistent with osseous metastatic disease. Electronically Signed   By: Titus Dubin M.D.   On: 04/20/2019 15:54   DG HIP UNILAT WITH PELVIS 2-3 VIEWS LEFT  Result Date: 04/20/2019 CLINICAL DATA:  Osseous metastatic disease. EXAM: LEFT FEMUR 2 VIEWS; DG HIP (WITH OR WITHOUT PELVIS) 2-3V LEFT COMPARISON:  Bone scan dated April 14, 2019. FINDINGS: Ill-defined 2.9 cm lucent lesion in the left superior acetabulum.  1.5 cm lucent lesion in the distal left femoral metadiaphysis. No discrete lesion identified in the left proximal femur. Mild right hip osteoarthritis. Prior left total knee arthroplasty. Calcified fibroid in the pelvis. Vascular calcifications. IMPRESSION: 1. Lucent lesions in the left superior acetabulum and distal left femoral metadiaphysis corresponding to areas of abnormal uptake on recent bone scan, consistent with osseous metastatic disease. 2. No discrete lesion identified in the left proximal femur at the site of abnormal uptake seen on recent both scan. Electronically Signed   By: Titus Dubin M.D.   On: 04/20/2019 15:59   DG FEMUR MIN 2 VIEWS LEFT  Result Date: 04/20/2019 CLINICAL DATA:  Osseous metastatic disease. EXAM: LEFT FEMUR 2 VIEWS; DG HIP (WITH OR WITHOUT PELVIS) 2-3V LEFT COMPARISON:  Bone scan dated April 14, 2019. FINDINGS: Ill-defined 2.9 cm lucent lesion in the left superior acetabulum. 1.5 cm lucent lesion in the distal left femoral metadiaphysis. No discrete lesion identified in the left proximal femur. Mild right hip osteoarthritis. Prior left total knee arthroplasty. Calcified fibroid in the pelvis. Vascular calcifications. IMPRESSION: 1. Lucent lesions in the left superior acetabulum and distal left femoral metadiaphysis corresponding to areas of abnormal uptake on recent bone scan,  consistent with osseous metastatic disease. 2. No discrete lesion identified in the left proximal femur at the site of abnormal uptake seen on recent both scan. Electronically Signed   By: Titus Dubin M.D.   On: 04/20/2019 15:59    No images are attached to the encounter.   CMP Latest Ref Rng & Units 04/20/2019  Glucose 70 - 99 mg/dL 167(H)  BUN 8 - 23 mg/dL 13  Creatinine 0.44 - 1.00 mg/dL 0.40(L)  Sodium 135 - 145 mmol/L 137  Potassium 3.5 - 5.1 mmol/L 3.6  Chloride 98 - 111 mmol/L 102  CO2 22 - 32 mmol/L 23  Calcium 8.9 - 10.3 mg/dL 9.1  Total Protein 6.5 - 8.1 g/dL 7.4  Total Bilirubin 0.3 - 1.2 mg/dL 0.6  Alkaline Phos 38 - 126 U/L 184(H)  AST 15 - 41 U/L 18  ALT 0 - 44 U/L 16   CBC Latest Ref Rng & Units 05/08/2019  WBC 4.0 - 10.5 K/uL 9.5  Hemoglobin 12.0 - 15.0 g/dL 14.4  Hematocrit 36.0 - 46.0 % 44.5  Platelets 150 - 400 K/uL 232     Observation/objective: appears in no acute distress over video visit today. Breathing is non labored  Assessment and plan: Patient is a 79 yr old female with lung mass, bone and brain mets  1. Lung mass- bronchoscopy on 05/11/19 for tissue diagnosis. NGS will be sent off on that  2. Bone mets- will finish palliative RT to left arm next week. Again discussed risks/ benefits of xgeva. It will take a while for patient to see a dentist. She is ok with proceeding with xgeva as benefits outweigh risks without dental clearance. She will get that next week  3. Brain mets- I have personally discussed her case with neurosurgery Dr. Lacinda Axon and Rad Onc Dr. Baruch Gouty. No role for neurosurgery. Will start her on decadron 4 mg BID given mild cerebral edema around left frontal region. She will start WBRT next week.  I will await NGS testing and WBRT to finish and tentatively start chemo in 3 weeks. Port placement and chemo teach prior. Will see her next week to discuss bronchoscopy results and further management  Follow-up instructions:as above  I  discussed the assessment and treatment plan with the patient. The patient  was provided an opportunity to ask questions and all were answered. The patient agreed with the plan and demonstrated an understanding of the instructions.   The patient was advised to call back or seek an in-person evaluation if the symptoms worsen or if the condition fails to improve as anticipated.  I provided 30 minutes of face-to-face video visit time during this encounter, and > 50% was spent counseling as documented under my assessment & plan. Also spent 30 min coirdinating care with Rad onc and neurosurgery  Visit Diagnosis: 1. Brain metastases ([place])   2. Bone metastases ([place])   3. Lung mass     Dr. Randa Evens, MD, MPH [facility] Tel- [phone] 05/10/2019 6:33 PM

## 2019-05-11 ENCOUNTER — Other Ambulatory Visit: Payer: Self-pay

## 2019-05-11 ENCOUNTER — Encounter
Admission: AD | Disposition: A | Discharge status: Home / Self Care | Payer: Self-pay | Source: Home / Self Care | Attending: Internal Medicine

## 2019-05-11 ENCOUNTER — Other Ambulatory Visit: Payer: Self-pay | Admitting: Oncology

## 2019-05-11 ENCOUNTER — Ambulatory Visit: Payer: Medicare Other | Admitting: Registered Nurse

## 2019-05-11 ENCOUNTER — Ambulatory Visit
Admission: RE | Admit: 2019-05-11 | Discharge: 2019-05-11 | Disposition: A | Discharge status: Home / Self Care | Payer: Medicare Other | Source: Ambulatory Visit | Attending: Radiation Oncology | Admitting: Radiation Oncology

## 2019-05-11 ENCOUNTER — Encounter: Payer: Self-pay | Admitting: Pulmonary Disease

## 2019-05-11 ENCOUNTER — Inpatient Hospital Stay
Admission: AD | Admit: 2019-05-11 | Discharge: 2019-05-14 | DRG: 982 | Disposition: A | Discharge status: Home / Self Care | Payer: Medicare Other | Attending: Internal Medicine | Admitting: Internal Medicine

## 2019-05-11 ENCOUNTER — Other Ambulatory Visit (INDEPENDENT_AMBULATORY_CARE_PROVIDER_SITE_OTHER): Payer: Self-pay | Admitting: Nurse Practitioner

## 2019-05-11 ENCOUNTER — Other Ambulatory Visit: Payer: Self-pay | Admitting: *Deleted

## 2019-05-11 ENCOUNTER — Encounter: Payer: Self-pay | Admitting: Radiation Oncology

## 2019-05-11 VITALS — BP 120/75 | HR 115 | Resp 18

## 2019-05-11 DIAGNOSIS — R918 Other nonspecific abnormal finding of lung field: Secondary | ICD-10-CM | POA: Diagnosis not present

## 2019-05-11 DIAGNOSIS — E0590 Thyrotoxicosis, unspecified without thyrotoxic crisis or storm: Secondary | ICD-10-CM | POA: Diagnosis present

## 2019-05-11 DIAGNOSIS — Z510 Encounter for antineoplastic radiation therapy: Secondary | ICD-10-CM | POA: Insufficient documentation

## 2019-05-11 DIAGNOSIS — I340 Nonrheumatic mitral (valve) insufficiency: Secondary | ICD-10-CM | POA: Diagnosis not present

## 2019-05-11 DIAGNOSIS — I10 Essential (primary) hypertension: Secondary | ICD-10-CM | POA: Diagnosis present

## 2019-05-11 DIAGNOSIS — M899 Disorder of bone, unspecified: Secondary | ICD-10-CM | POA: Diagnosis not present

## 2019-05-11 DIAGNOSIS — F419 Anxiety disorder, unspecified: Secondary | ICD-10-CM | POA: Diagnosis present

## 2019-05-11 DIAGNOSIS — R590 Localized enlarged lymph nodes: Secondary | ICD-10-CM | POA: Diagnosis not present

## 2019-05-11 DIAGNOSIS — Z91040 Latex allergy status: Secondary | ICD-10-CM

## 2019-05-11 DIAGNOSIS — Z20822 Contact with and (suspected) exposure to covid-19: Secondary | ICD-10-CM | POA: Diagnosis present

## 2019-05-11 DIAGNOSIS — I472 Ventricular tachycardia: Secondary | ICD-10-CM | POA: Diagnosis not present

## 2019-05-11 DIAGNOSIS — I2510 Atherosclerotic heart disease of native coronary artery without angina pectoris: Secondary | ICD-10-CM | POA: Diagnosis present

## 2019-05-11 DIAGNOSIS — C7951 Secondary malignant neoplasm of bone: Secondary | ICD-10-CM | POA: Insufficient documentation

## 2019-05-11 DIAGNOSIS — Z79899 Other long term (current) drug therapy: Secondary | ICD-10-CM

## 2019-05-11 DIAGNOSIS — Z87311 Personal history of (healed) other pathological fracture: Secondary | ICD-10-CM

## 2019-05-11 DIAGNOSIS — I2582 Chronic total occlusion of coronary artery: Secondary | ICD-10-CM | POA: Diagnosis present

## 2019-05-11 DIAGNOSIS — I420 Dilated cardiomyopathy: Secondary | ICD-10-CM | POA: Diagnosis not present

## 2019-05-11 DIAGNOSIS — I4891 Unspecified atrial fibrillation: Secondary | ICD-10-CM | POA: Diagnosis not present

## 2019-05-11 DIAGNOSIS — E1140 Type 2 diabetes mellitus with diabetic neuropathy, unspecified: Secondary | ICD-10-CM | POA: Diagnosis present

## 2019-05-11 DIAGNOSIS — Z6835 Body mass index (BMI) 35.0-35.9, adult: Secondary | ICD-10-CM

## 2019-05-11 DIAGNOSIS — E119 Type 2 diabetes mellitus without complications: Secondary | ICD-10-CM | POA: Diagnosis not present

## 2019-05-11 DIAGNOSIS — I252 Old myocardial infarction: Secondary | ICD-10-CM | POA: Diagnosis not present

## 2019-05-11 DIAGNOSIS — Z66 Do not resuscitate: Secondary | ICD-10-CM | POA: Diagnosis present

## 2019-05-11 DIAGNOSIS — I110 Hypertensive heart disease with heart failure: Secondary | ICD-10-CM | POA: Diagnosis present

## 2019-05-11 DIAGNOSIS — C3412 Malignant neoplasm of upper lobe, left bronchus or lung: Secondary | ICD-10-CM | POA: Insufficient documentation

## 2019-05-11 DIAGNOSIS — Z6839 Body mass index (BMI) 39.0-39.9, adult: Secondary | ICD-10-CM | POA: Diagnosis not present

## 2019-05-11 DIAGNOSIS — C7931 Secondary malignant neoplasm of brain: Secondary | ICD-10-CM

## 2019-05-11 DIAGNOSIS — C3490 Malignant neoplasm of unspecified part of unspecified bronchus or lung: Secondary | ICD-10-CM

## 2019-05-11 DIAGNOSIS — Z923 Personal history of irradiation: Secondary | ICD-10-CM | POA: Insufficient documentation

## 2019-05-11 DIAGNOSIS — Z800 Family history of malignant neoplasm of digestive organs: Secondary | ICD-10-CM

## 2019-05-11 DIAGNOSIS — I5022 Chronic systolic (congestive) heart failure: Secondary | ICD-10-CM | POA: Diagnosis present

## 2019-05-11 DIAGNOSIS — R062 Wheezing: Secondary | ICD-10-CM | POA: Diagnosis not present

## 2019-05-11 DIAGNOSIS — Z95810 Presence of automatic (implantable) cardiac defibrillator: Secondary | ICD-10-CM

## 2019-05-11 DIAGNOSIS — I493 Ventricular premature depolarization: Secondary | ICD-10-CM | POA: Diagnosis not present

## 2019-05-11 DIAGNOSIS — Z87891 Personal history of nicotine dependence: Secondary | ICD-10-CM | POA: Diagnosis not present

## 2019-05-11 DIAGNOSIS — I255 Ischemic cardiomyopathy: Secondary | ICD-10-CM | POA: Diagnosis not present

## 2019-05-11 DIAGNOSIS — I509 Heart failure, unspecified: Secondary | ICD-10-CM | POA: Diagnosis not present

## 2019-05-11 DIAGNOSIS — E669 Obesity, unspecified: Secondary | ICD-10-CM | POA: Diagnosis present

## 2019-05-11 DIAGNOSIS — C3432 Malignant neoplasm of lower lobe, left bronchus or lung: Secondary | ICD-10-CM | POA: Diagnosis present

## 2019-05-11 DIAGNOSIS — I471 Supraventricular tachycardia: Secondary | ICD-10-CM | POA: Diagnosis not present

## 2019-05-11 DIAGNOSIS — E039 Hypothyroidism, unspecified: Secondary | ICD-10-CM | POA: Diagnosis present

## 2019-05-11 DIAGNOSIS — Z7984 Long term (current) use of oral hypoglycemic drugs: Secondary | ICD-10-CM

## 2019-05-11 DIAGNOSIS — I480 Paroxysmal atrial fibrillation: Secondary | ICD-10-CM | POA: Diagnosis present

## 2019-05-11 DIAGNOSIS — E785 Hyperlipidemia, unspecified: Secondary | ICD-10-CM | POA: Diagnosis present

## 2019-05-11 DIAGNOSIS — Z833 Family history of diabetes mellitus: Secondary | ICD-10-CM

## 2019-05-11 DIAGNOSIS — Z8249 Family history of ischemic heart disease and other diseases of the circulatory system: Secondary | ICD-10-CM

## 2019-05-11 DIAGNOSIS — Z79890 Hormone replacement therapy (postmenopausal): Secondary | ICD-10-CM

## 2019-05-11 DIAGNOSIS — R042 Hemoptysis: Secondary | ICD-10-CM | POA: Diagnosis present

## 2019-05-11 DIAGNOSIS — E6609 Other obesity due to excess calories: Secondary | ICD-10-CM | POA: Diagnosis not present

## 2019-05-11 DIAGNOSIS — Z96652 Presence of left artificial knee joint: Secondary | ICD-10-CM | POA: Diagnosis present

## 2019-05-11 HISTORY — PX: VIDEO BRONCHOSCOPY WITH ENDOBRONCHIAL ULTRASOUND: SHX6177

## 2019-05-11 LAB — CBC WITH DIFFERENTIAL/PLATELET
Abs Immature Granulocytes: 0.12 10*3/uL — ABNORMAL HIGH (ref 0.00–0.07)
Basophils Absolute: 0 10*3/uL (ref 0.0–0.1)
Basophils Relative: 0 %
Eosinophils Absolute: 0 10*3/uL (ref 0.0–0.5)
Eosinophils Relative: 0 %
HCT: 42.6 % (ref 36.0–46.0)
Hemoglobin: 14 g/dL (ref 12.0–15.0)
Immature Granulocytes: 1 %
Lymphocytes Relative: 8 %
Lymphs Abs: 1.1 10*3/uL (ref 0.7–4.0)
MCH: 27.8 pg (ref 26.0–34.0)
MCHC: 32.9 g/dL (ref 30.0–36.0)
MCV: 84.5 fL (ref 80.0–100.0)
Monocytes Absolute: 0.9 10*3/uL (ref 0.1–1.0)
Monocytes Relative: 6 %
Neutro Abs: 12.2 10*3/uL — ABNORMAL HIGH (ref 1.7–7.7)
Neutrophils Relative %: 85 %
Platelets: 287 10*3/uL (ref 150–400)
RBC: 5.04 MIL/uL (ref 3.87–5.11)
RDW: 15.6 % — ABNORMAL HIGH (ref 11.5–15.5)
WBC: 14.2 10*3/uL — ABNORMAL HIGH (ref 4.0–10.5)
nRBC: 0 % (ref 0.0–0.2)

## 2019-05-11 LAB — RENAL FUNCTION PANEL
Albumin: 3.6 g/dL (ref 3.5–5.0)
Anion gap: 10 (ref 5–15)
BUN: 25 mg/dL — ABNORMAL HIGH (ref 8–23)
CO2: 24 mmol/L (ref 22–32)
Calcium: 8.3 mg/dL — ABNORMAL LOW (ref 8.9–10.3)
Chloride: 104 mmol/L (ref 98–111)
Creatinine, Ser: 0.55 mg/dL (ref 0.44–1.00)
GFR calc Af Amer: >60 mL/min (ref >60)
GFR calc non Af Amer: >60 mL/min (ref >60)
Glucose, Bld: 267 mg/dL — ABNORMAL HIGH (ref 70–99)
Phosphorus: 4.2 mg/dL (ref 2.5–4.6)
Potassium: 4.2 mmol/L (ref 3.5–5.1)
Sodium: 138 mmol/L (ref 135–145)

## 2019-05-11 LAB — GLUCOSE, CAPILLARY
Glucose-Capillary: 266 mg/dL — ABNORMAL HIGH (ref 70–99)
Glucose-Capillary: 255 mg/dL — ABNORMAL HIGH (ref 70–99)
Glucose-Capillary: 294 mg/dL — ABNORMAL HIGH (ref 70–99)
Glucose-Capillary: 234 mg/dL — ABNORMAL HIGH (ref 70–99)

## 2019-05-11 LAB — TROPONIN I (HIGH SENSITIVITY)
Troponin I (High Sensitivity): 40 ng/L — ABNORMAL HIGH (ref <18)
Troponin I (High Sensitivity): 38 ng/L — ABNORMAL HIGH (ref <18)
Troponin I (High Sensitivity): 41 ng/L — ABNORMAL HIGH (ref <18)
Troponin I (High Sensitivity): 37 ng/L — ABNORMAL HIGH (ref <18)

## 2019-05-11 LAB — HEMOGLOBIN A1C
Hgb A1c MFr Bld: 7.1 % — ABNORMAL HIGH (ref 4.8–5.6)
Mean Plasma Glucose: 157.07 mg/dL

## 2019-05-11 LAB — PROTIME-INR
INR: 1.1 (ref 0.8–1.2)
Prothrombin Time: 14.2 seconds (ref 11.4–15.2)

## 2019-05-11 LAB — TSH: TSH: 0.183 u[IU]/mL — ABNORMAL LOW (ref 0.350–4.500)

## 2019-05-11 LAB — APTT: aPTT: 27 seconds (ref 24–36)

## 2019-05-11 LAB — MAGNESIUM: Magnesium: 2 mg/dL (ref 1.7–2.4)

## 2019-05-11 SURGERY — BRONCHOSCOPY, WITH EBUS
Anesthesia: General

## 2019-05-11 MED ORDER — ACETAMINOPHEN 650 MG RE SUPP: 650.0000 mg | Freq: Four times a day (QID) | RECTAL | Status: DC | PRN

## 2019-05-11 MED ORDER — ROCURONIUM BROMIDE 50 MG/5ML IV SOLN
INTRAVENOUS | Status: AC
  Filled 2019-05-11: qty 1

## 2019-05-11 MED ORDER — BUTAMBEN-TETRACAINE-BENZOCAINE 2-2-14 % EX AERO
1.0000 | INHALATION_SPRAY | Freq: Once | CUTANEOUS | Status: DC
  Filled 2019-05-11: qty 20

## 2019-05-11 MED ORDER — HYDRALAZINE HCL 50 MG PO TABS
25.0000 mg | ORAL_TABLET | Freq: Three times a day (TID) | ORAL | Status: DC | PRN
  Filled 2019-05-11: qty 1

## 2019-05-11 MED ORDER — ONDANSETRON HCL 4 MG PO TABS: 4.0000 mg | ORAL_TABLET | Freq: Four times a day (QID) | ORAL | Status: DC | PRN

## 2019-05-11 MED ORDER — OXYCODONE HCL 5 MG PO TABS
5.0000 mg | ORAL_TABLET | ORAL | Status: DC | PRN
  Administered 2019-05-11 – 2019-05-12 (×2): 5 mg via ORAL
  Filled 2019-05-11 (×2): qty 1

## 2019-05-11 MED ORDER — HEPARIN BOLUS VIA INFUSION
4000.0000 [IU] | Freq: Once | INTRAVENOUS | Status: AC
  Administered 2019-05-11: 4000 [IU] via INTRAVENOUS
  Filled 2019-05-11: qty 4000

## 2019-05-11 MED ORDER — PROPOFOL 10 MG/ML IV BOLUS
INTRAVENOUS | Status: AC
  Filled 2019-05-11: qty 20

## 2019-05-11 MED ORDER — ATORVASTATIN CALCIUM 20 MG PO TABS
40.0000 mg | ORAL_TABLET | Freq: Every day | ORAL | Status: DC
  Administered 2019-05-11 – 2019-05-13 (×3): 40 mg via ORAL
  Filled 2019-05-11 (×3): qty 2

## 2019-05-11 MED ORDER — EPHEDRINE SULFATE 50 MG/ML IJ SOLN
INTRAMUSCULAR | Status: AC
  Filled 2019-05-11: qty 1

## 2019-05-11 MED ORDER — METOPROLOL TARTRATE 5 MG/5ML IV SOLN
INTRAVENOUS | Status: AC
  Filled 2019-05-11: qty 5

## 2019-05-11 MED ORDER — FENTANYL CITRATE (PF) 100 MCG/2ML IJ SOLN
INTRAMUSCULAR | Status: AC
  Filled 2019-05-11: qty 2

## 2019-05-11 MED ORDER — PHENYLEPHRINE HCL (PRESSORS) 10 MG/ML IV SOLN
INTRAVENOUS | Status: AC
  Filled 2019-05-11: qty 1

## 2019-05-11 MED ORDER — SUGAMMADEX SODIUM 200 MG/2ML IV SOLN
INTRAVENOUS | Status: AC
  Filled 2019-05-11: qty 2

## 2019-05-11 MED ORDER — SODIUM CHLORIDE (PF) 0.9 % IJ SOLN
INTRAMUSCULAR | Status: AC
  Filled 2019-05-11: qty 10

## 2019-05-11 MED ORDER — ONDANSETRON HCL 4 MG/2ML IJ SOLN: 4.0000 mg | Freq: Four times a day (QID) | INTRAMUSCULAR | Status: DC | PRN

## 2019-05-11 MED ORDER — DILTIAZEM HCL-DEXTROSE 125-5 MG/125ML-% IV SOLN (PREMIX)
5.0000 mg/h | INTRAVENOUS | Status: DC
  Administered 2019-05-11: 5 mg/h via INTRAVENOUS
  Administered 2019-05-11: 10 mg/h via INTRAVENOUS
  Administered 2019-05-12: 15 mg/h via INTRAVENOUS
  Filled 2019-05-11 (×2): qty 125

## 2019-05-11 MED ORDER — LIDOCAINE HCL (PF) 2 % IJ SOLN
INTRAMUSCULAR | Status: AC
  Filled 2019-05-11: qty 10

## 2019-05-11 MED ORDER — VITAMIN B-6 50 MG PO TABS
100.0000 mg | ORAL_TABLET | Freq: Every day | ORAL | Status: DC
  Administered 2019-05-11 – 2019-05-13 (×3): 100 mg via ORAL
  Filled 2019-05-11 (×4): qty 2

## 2019-05-11 MED ORDER — LEVOTHYROXINE SODIUM 112 MCG PO TABS
112.0000 ug | ORAL_TABLET | Freq: Every day | ORAL | Status: DC
  Administered 2019-05-12 – 2019-05-13 (×2): 112 ug via ORAL
  Filled 2019-05-11 (×2): qty 1

## 2019-05-11 MED ORDER — METOPROLOL TARTRATE 5 MG/5ML IV SOLN
INTRAVENOUS | Status: DC | PRN
  Administered 2019-05-11 (×2): 2.5 mg via INTRAVENOUS

## 2019-05-11 MED ORDER — VITAMIN B-12 100 MCG PO TABS
100.0000 ug | ORAL_TABLET | Freq: Every day | ORAL | Status: DC
  Administered 2019-05-11 – 2019-05-13 (×3): 100 ug via ORAL
  Filled 2019-05-11 (×3): qty 1

## 2019-05-11 MED ORDER — GUAIFENESIN-DM 100-10 MG/5ML PO SYRP
5.0000 mL | ORAL_SOLUTION | ORAL | Status: DC | PRN
  Administered 2019-05-11: 5 mL via ORAL
  Filled 2019-05-11: qty 5

## 2019-05-11 MED ORDER — ESMOLOL HCL 100 MG/10ML IV SOLN
INTRAVENOUS | Status: DC | PRN
  Administered 2019-05-11: 20 ug via INTRAVENOUS
  Administered 2019-05-11 (×2): 30 ug via INTRAVENOUS

## 2019-05-11 MED ORDER — INSULIN ASPART 100 UNIT/ML ~~LOC~~ SOLN
0.0000 [IU] | Freq: Three times a day (TID) | SUBCUTANEOUS | Status: DC
  Administered 2019-05-12: 5 [IU] via SUBCUTANEOUS
  Administered 2019-05-12 (×2): 3 [IU] via SUBCUTANEOUS
  Administered 2019-05-13: 7 [IU] via SUBCUTANEOUS
  Administered 2019-05-13 (×2): 2 [IU] via SUBCUTANEOUS
  Administered 2019-05-14: 3 [IU] via SUBCUTANEOUS
  Filled 2019-05-11 (×7): qty 1

## 2019-05-11 MED ORDER — HEPARIN (PORCINE) 25000 UT/250ML-% IV SOLN
1000.0000 [IU]/h | INTRAVENOUS | Status: DC
  Administered 2019-05-11: 850 [IU]/h via INTRAVENOUS
  Administered 2019-05-12: 950 [IU]/h via INTRAVENOUS
  Administered 2019-05-14: 1000 [IU]/h via INTRAVENOUS
  Filled 2019-05-11 (×4): qty 250

## 2019-05-11 MED ORDER — FAMOTIDINE 20 MG PO TABS
ORAL_TABLET | ORAL | Status: AC
  Administered 2019-05-11: 20 mg via ORAL
  Filled 2019-05-11: qty 1

## 2019-05-11 MED ORDER — ONDANSETRON HCL 4 MG/2ML IJ SOLN
INTRAMUSCULAR | Status: AC
  Filled 2019-05-11: qty 2

## 2019-05-11 MED ORDER — FAMOTIDINE 20 MG PO TABS: 20.0000 mg | ORAL_TABLET | Freq: Once | ORAL | Status: AC

## 2019-05-11 MED ORDER — INSULIN ASPART 100 UNIT/ML ~~LOC~~ SOLN
0.0000 [IU] | Freq: Every day | SUBCUTANEOUS | Status: DC
  Administered 2019-05-11: 2 [IU] via SUBCUTANEOUS
  Administered 2019-05-12 – 2019-05-13 (×2): 3 [IU] via SUBCUTANEOUS
  Filled 2019-05-11 (×3): qty 1

## 2019-05-11 MED ORDER — ACETAMINOPHEN 325 MG PO TABS: 650.0000 mg | ORAL_TABLET | Freq: Four times a day (QID) | ORAL | Status: DC | PRN

## 2019-05-11 MED ORDER — ESMOLOL HCL 100 MG/10ML IV SOLN
INTRAVENOUS | Status: AC
  Filled 2019-05-11: qty 10

## 2019-05-11 MED ORDER — SUCCINYLCHOLINE CHLORIDE 20 MG/ML IJ SOLN
INTRAMUSCULAR | Status: AC
  Filled 2019-05-11: qty 1

## 2019-05-11 MED ORDER — INFLUENZA VAC A&B SA ADJ QUAD 0.5 ML IM PRSY
0.5000 mL | PREFILLED_SYRINGE | INTRAMUSCULAR | Status: DC
  Filled 2019-05-11: qty 0.5

## 2019-05-11 MED ORDER — DILTIAZEM LOAD VIA INFUSION
10.0000 mg | Freq: Once | INTRAVENOUS | Status: AC
  Administered 2019-05-11: 10 mg via INTRAVENOUS
  Filled 2019-05-11: qty 10

## 2019-05-11 MED ORDER — LISINOPRIL 5 MG PO TABS
10.0000 mg | ORAL_TABLET | Freq: Every day | ORAL | Status: DC
  Administered 2019-05-11 – 2019-05-13 (×3): 10 mg via ORAL
  Filled 2019-05-11: qty 2
  Filled 2019-05-11 (×3): qty 1
  Filled 2019-05-11 (×2): qty 2
  Filled 2019-05-11: qty 1

## 2019-05-11 MED ORDER — CHLORHEXIDINE GLUCONATE CLOTH 2 % EX PADS
6.0000 | MEDICATED_PAD | Freq: Every day | CUTANEOUS | Status: DC
  Administered 2019-05-11 – 2019-05-13 (×4): 6 via TOPICAL

## 2019-05-11 MED ORDER — SODIUM CHLORIDE 0.9 % IV SOLN: INTRAVENOUS | Status: DC

## 2019-05-11 MED ORDER — METOPROLOL TARTRATE 25 MG PO TABS
25.0000 mg | ORAL_TABLET | Freq: Two times a day (BID) | ORAL | Status: DC
  Administered 2019-05-11 – 2019-05-13 (×5): 25 mg via ORAL
  Filled 2019-05-11 (×6): qty 1

## 2019-05-11 MED ORDER — SODIUM CHLORIDE 0.9 % IV SOLN: Freq: Once | INTRAVENOUS | Status: AC

## 2019-05-11 MED ORDER — DEXAMETHASONE SODIUM PHOSPHATE 10 MG/ML IJ SOLN
INTRAMUSCULAR | Status: AC
  Filled 2019-05-11: qty 1

## 2019-05-11 MED ORDER — HYDROCORTISONE 1 % EX OINT
1.0000 "application " | TOPICAL_OINTMENT | Freq: Two times a day (BID) | CUTANEOUS | Status: DC | PRN
  Filled 2019-05-11: qty 28.35

## 2019-05-11 MED ORDER — CLONAZEPAM 1 MG PO TABS
1.0000 mg | ORAL_TABLET | Freq: Three times a day (TID) | ORAL | Status: DC | PRN
  Administered 2019-05-12 – 2019-05-13 (×3): 1 mg via ORAL
  Filled 2019-05-11 (×3): qty 2

## 2019-05-11 MED ORDER — KETAMINE HCL 50 MG/ML IJ SOLN
INTRAMUSCULAR | Status: AC
  Filled 2019-05-11: qty 10

## 2019-05-11 MED ORDER — DEXAMETHASONE 4 MG PO TABS
4.0000 mg | ORAL_TABLET | Freq: Two times a day (BID) | ORAL | Status: DC
  Administered 2019-05-12 – 2019-05-14 (×5): 4 mg via ORAL
  Filled 2019-05-11 (×6): qty 1

## 2019-05-11 NOTE — H&P (Addendum)
History and Physical    Megan Shelton NGE:[id number] DOB: Jan 17, 1941 DOA: 05/11/2019  Referring MD/NP/PA:   PCP: Mar Daring, PA-C   Patient coming from:  The patient is coming from home.  At baseline, pt is independent for most of ADL.        Chief Complaint:  A fib with RVR  HPI: Megan Shelton is a 79 y.o. female with medical history significant of hypertension, hyperlipidemia, diabetes mellitus, hypothyroidism, anxiety, CAD, ICD placement, CHF, metastasized stage IV lung cancer, who presents with atrial fibrillation with RVR.  Pt was recently diagnosed with stage IV metastasized lung cancer. She was seen by pulmonary and was scheduled for bronchoscopy by Dr. Patsey Berthold today. Initially, her heart rate was noted to be in the 70s.  However, before induction of anesthesia, she was noted to be tachycardic with heart rate around 150 bpm.  EKG showed atrial fibrillation with RVR, with heart rate up to 150s, which is a new diagnosis.  Patient is asymptomatic, denies chest pain, shortness of breath, palpitation.  No cough, fever or chills.  Denies nausea, vomiting, diarrhea, abdominal pain, symptoms of UTI or unilateral weakness.  She states that she had a metastasized cancer to the left arm which was treated with radiation therapy recently.  Pt was found to have WBC 14.2, pending COVID-19 PCR (patient had a negative COVID-19 on 1/29), electrolytes renal function okay, temperature 97.3, RR 25, oxygen saturation 95% on room air.  Patient is admitted to stepdown as inpatient.  Cardiology was consulted.   Review of Systems:   General: no fevers, chills, no body weight gain, fatigue HEENT: no blurry vision, hearing changes or sore throat Respiratory: no dyspnea, coughing, wheezing CV: no chest pain, no palpitations GI: no nausea, vomiting, abdominal pain, diarrhea, constipation GU: no dysuria, burning on urination, increased urinary frequency, hematuria  Ext: no leg edema Neuro: no  unilateral weakness, numbness, or tingling, no vision change or hearing loss Skin: no rash, no skin tear. MSK: No muscle spasm, no deformity, no limitation of range of movement in spin Heme: No easy bruising.  Travel history: No recent long distant travel.  Allergy:  Allergies  Allergen Reactions  . Latex Rash  . Nickel Rash    Past Medical History:  Diagnosis Date  . AICD (automatic cardioverter/defibrillator) present   . Anxiety   . CHF (congestive heart failure) ([place])   . Diabetes mellitus with neuropathy ([place])   . Dyspnea   . Hypertension   . Hypertensive heart disease   . Implantable cardioverter-defibrillator (ICD)-[employer]tronic   . Myocardial infarction, anterior wall ([place])   . Obesity, Class II, BMI 35-39.9     Past Surgical History:  Procedure Laterality Date  . CARPAL TUNNEL RELEASE    . cataract surgery    . EYE SURGERY    . FRACTURE SURGERY Left    ARM  . JOINT REPLACEMENT Left    tkr    Social History:  reports that she quit smoking about 15 years ago. Her smoking use included cigarettes. She has a 55.00 pack-year smoking history. She has never used smokeless tobacco. She reports that she does not drink alcohol or use drugs.  Family History:  Family History  Problem Relation Age of Onset  . Scleroderma Mother   . Raynaud syndrome Mother   . Cancer Father 92       colon cancer  . Heart disease Brother        died of heart attack  . Diabetes Brother  Prior to Admission medications   Medication Sig Start Date End Date Taking? Authorizing Provider  atorvastatin (LIPITOR) 40 MG tablet TAKE 1 TABLET BY MOUTH  DAILY Patient taking differently: Take 40 mg by mouth daily.  01/12/19  Yes Burnette, Anderson [place] M, PA-C  clonazePAM (KLONOPIN) 1 MG tablet TAKE 1 TABLET BY MOUTH 3 TIMES A DAY AS NEEDED FOR ANXIETY Patient taking differently: Take 1 mg by mouth 3 (three) times daily as needed for anxiety.  11/21/18  Yes Fenton Malling M, PA-C  dexamethasone  (DECADRON) 4 MG tablet Take 1 tablet (4 mg total) by mouth 2 (two) times daily with a meal. 05/08/19  Yes Sindy Guadeloupe, MD  furosemide (LASIX) 40 MG tablet TAKE 1 TABLET BY MOUTH EVERY DAY AS NEEDED Patient taking differently: Take 40 mg by mouth daily as needed for fluid.  05/12/18  Yes Mar Daring, PA-C  glimepiride (AMARYL) 1 MG tablet TAKE 1 TABLET BY MOUTH TWO  TIMES DAILY 01/12/19  Yes Mar Daring, PA-C  hydrocortisone 1 % ointment Apply 1 application topically 2 (two) times daily. May use for skin irritation on bilateral eyelids. Do not get inside of eyes. Patient taking differently: Apply 1 application topically 2 (two) times daily as needed for itching. May use for skin irritation on bilateral eyelids. Do not get inside of eyes. 03/04/19  Yes Mar Daring, PA-C  ibuprofen (ADVIL) 800 MG tablet Take 800 mg by mouth every 8 (eight) hours as needed for moderate pain.    Yes [provider]  levothyroxine (SYNTHROID) 112 MCG tablet TAKE 1 TABLET BY MOUTH  DAILY BEFORE BREAKFAST Patient taking differently: Take 112 mcg by mouth daily before breakfast.  01/12/19  Yes Burnette, Jennifer M, PA-C  lisinopril (ZESTRIL) 10 MG tablet TAKE 1 TABLET BY MOUTH  DAILY Patient taking differently: Take 10 mg by mouth daily.  01/12/19  Yes Fenton Malling M, PA-C  metFORMIN (GLUCOPHAGE-XR) 500 MG 24 hr tablet TAKE 1 TABLET BY MOUTH IN  THE MORNING AND 2 TABLETS  IN THE EVENING Patient taking differently: Take 500 mg by mouth 2 (two) times daily.  12/23/17  Yes Mar Daring, PA-C  metoprolol tartrate (LOPRESSOR) 25 MG tablet TAKE 1 TABLET BY MOUTH TWO  TIMES DAILY Patient taking differently: Take 25 mg by mouth 2 (two) times daily.  06/16/18  Yes Fenton Malling M, PA-C  ondansetron (ZOFRAN ODT) 4 MG disintegrating tablet Take 1 tablet (4 mg total) by mouth every 8 (eight) hours as needed for nausea or vomiting. 05/08/19  Yes Sindy Guadeloupe, MD  oxyCODONE (OXY  IR/ROXICODONE) 5 MG immediate release tablet Take 1 tablet (5 mg total) by mouth every 4 (four) hours as needed for severe pain. 05/08/19  Yes Sindy Guadeloupe, MD  pyridOXINE (VITAMIN B-6) 100 MG tablet Take 100 mg by mouth daily.   Yes [provider]  sitaGLIPtin (JANUVIA) 100 MG tablet Take 100 mg by mouth daily.   Yes [provider]  vitamin B-12 (CYANOCOBALAMIN) 100 MCG tablet Take 100 mcg by mouth daily.   Yes [provider]  Blood Glucose Monitoring Suppl (ACCU-CHEK COMPACT CARE KIT) KIT To check blood sugar daily. 11/24/18   Mar Daring, PA-C  Blood Glucose Monitoring Suppl (ACCU-CHEK GUIDE) w/Device KIT  11/24/18   [provider]  glucose blood (ACCU-CHEK COMPACT PLUS) test strip To check blood sugar daily. 11/24/18   Mar Daring, PA-C  Lancets (ACCU-CHEK SOFT TOUCH) lancets To check blood sugar  daily. 11/24/18   Mar Daring, PA-C  [facility] DELICA LANCETS 32P MISC CHECK BLOOD SUGAR ONCE  DAILY 10/11/17   Mar Daring, [place]    Physical Exam: Vitals:   05/11/19 1640 05/11/19 1655 05/11/19 1710 05/11/19 1725  BP:  (!) 109/95 113/76 113/70  Pulse: 94 (!) 59 (!) 47 80  Resp: 20 20 (!) 24 17  Temp:    98.8 F (37.1 C)  TempSrc:      SpO2: 95% 95% 97% 92%  Weight:      Height:       General: Not in acute distress HEENT:       Eyes: PERRL, EOMI, no scleral icterus.       ENT: No discharge from the ears and nose, no pharynx injection, no tonsillar enlargement.        Neck: No JVD, no bruit, no mass felt. Heme: No neck lymph node enlargement. Cardiac: S1/S2, irregularly irregular rhythm, No murmurs, No gallops or rubs. Respiratory: No rales, wheezing, rhonchi or rubs. GI: Soft, nondistended, nontender, no rebound pain, no organomegaly, BS present. GU: No hematuria Ext: No pitting leg edema bilaterally. 2+DP/PT pulse bilaterally. Musculoskeletal: No joint deformities, No joint redness or warmth, no limitation of ROM in  spin. Skin: No rashes.  Neuro: Alert, oriented X3, cranial nerves II-XII grossly intact, moves all extremities normally.  Psych: Patient is not psychotic, no suicidal or hemocidal ideation.  Labs on Admission: I have personally reviewed following labs and imaging studies  CBC: Recent Labs  Lab 05/08/19 0905 05/11/19 1418  WBC 9.5 14.2*  NEUTROABS  --  12.2*  HGB 14.4 14.0  HCT 44.5 42.6  MCV 84.0 84.5  PLT 232 498   Basic Metabolic Panel: Recent Labs  Lab 05/11/19 1418  NA 138  K 4.2  CL 104  CO2 24  GLUCOSE 267*  BUN 25*  CREATININE 0.55  CALCIUM 8.3*  MG 2.0  PHOS 4.2   GFR: Estimated Creatinine Clearance: 51.9 mL/min (by C-G formula based on SCr of 0.55 mg/dL). Liver Function Tests: Recent Labs  Lab 05/11/19 1418  ALBUMIN 3.6   No results for input(s): LIPASE, AMYLASE in the last 168 hours. No results for input(s): AMMONIA in the last 168 hours. Coagulation Profile: No results for input(s): INR, PROTIME in the last 168 hours. Cardiac Enzymes: No results for input(s): CKTOTAL, CKMB, CKMBINDEX, TROPONINI in the last 168 hours. BNP (last 3 results) No results for input(s): PROBNP in the last 8760 hours. HbA1C: No results for input(s): HGBA1C in the last 72 hours. CBG: Recent Labs  Lab 05/11/19 1200 05/11/19 1430 05/11/19 1748  GLUCAP 266* 255* 294*   Lipid Profile: No results for input(s): CHOL, HDL, LDLCALC, TRIG, CHOLHDL, LDLDIRECT in the last 72 hours. Thyroid Function Tests: Recent Labs    05/11/19 1418  TSH 0.183*   Anemia Panel: No results for input(s): VITAMINB12, FOLATE, FERRITIN, TIBC, IRON, RETICCTPCT in the last 72 hours. Urine analysis: No results found for: COLORURINE, APPEARANCEUR, LABSPEC, PHURINE, GLUCOSEU, HGBUR, BILIRUBINUR, KETONESUR, PROTEINUR, UROBILINOGEN, NITRITE, LEUKOCYTESUR Sepsis Labs: '@LABRCNTIP' (procalcitonin:4,lacticidven:4) ) Recent Results (from the past 240 hour(s))  SARS CORONAVIRUS 2 (TAT 6-24 HRS)  Nasopharyngeal Nasopharyngeal Swab     Status: None   Collection Time: 05/08/19  9:32 AM   Specimen: Nasopharyngeal Swab  Result Value Ref Range Status   SARS Coronavirus 2 NEGATIVE NEGATIVE Final    Comment: (NOTE) SARS-CoV-2 target nucleic acids are NOT DETECTED. The SARS-CoV-2 RNA is generally detectable in upper and  lower respiratory specimens during the acute phase of infection. Negative results do not preclude SARS-CoV-2 infection, do not rule out co-infections with other pathogens, and should not be used as the sole basis for treatment or other patient management decisions. Negative results must be combined with clinical observations, patient history, and epidemiological information. The expected result is Negative. Fact Sheet for Patients: SugarRoll.be Fact Sheet for Healthcare Providers: https://www.woods-mathews.com/ This test is not yet approved or cleared by the [place] FDA and  has been authorized for detection and/or diagnosis of SARS-CoV-2 by FDA under an Emergency Use Authorization (EUA). This EUA will remain  in effect (meaning this test can be used) for the duration of the COVID-19 declaration under Section 56 4(b)(1) of the Act, 21 U.S.C. section 360bbb-3(b)(1), unless the authorization is terminated or revoked sooner. Performed at [facility], [place] [address], [place], [place] [address]      Radiological Exams on Admission: No results found.   EKG: I could not find the EKG result, will get another EKG.   Assessment/Plan Principal Problem:   Atrial fibrillation with RVR (HCC) Active Problems:   Diabetes mellitus with neuropathy (HCC)   Essential hypertension   Chronic HFrEF (heart failure with reduced ejection fraction) (HCC)   Hyperlipidemia LDL goal <70   Bone metastases (HCC)   Lung cancer-metastasized   Atrial fibrillation with RVR ([place]): This is a new onset of atrial fibrillation with RVR.  CHA2DS2-VASc Score is 7, needs oral anticoagulation. Dr. Fletcher Anon of card was consulted. He recommended to start anticoagulants, get a 2D echo and trend troponin.  -will admit to SDU as inpt -Highly appreciate Dr. Tyrell Antonio consultation -on Cardizem drip -Continue metoprolol -Trend troponin -Get 2D echo -check TSH  Diabetes mellitus with neuropathy ([place]): Most recent A1c 6.4, well controled. Patient is taking Metformin, glipizide, Januvia at home -SSI  Essential hypertension: -prn Hydralazine -Continue home medications: Lisinopril, metoprolol  Chronic HFrEF (heart failure with reduced ejection fraction) ([place]): No leg edema JVD.  No shortness of breath.  CHF is compensated. -Continue home Lasix  Hyperlipidemia LDL goal <70 -Lipitor  Lung cancer-metastasized and bone metastases ([place]): per Dr. Patsey Berthold, pt will need bronchoscopy to determine type of malignancy so she can receive therapy, likely on Wednesday. The patient also needs a Port-A-Cath placed. Dr. Patsey Berthold will contact Dr. Lucky Cowboy in the morning to try to coordinate placement while patient is in-house. -f/u with oncology    Inpatient status:  # Patient requires inpatient status due to high intensity of service, high risk for further deterioration and high frequency of surveillance required.  I certify that at the point of admission it is my clinical judgment that the patient will require inpatient hospital care spanning beyond 2 midnights from the point of admission.  . This patient has multiple chronic comorbidities including hypertension, hyperlipidemia, diabetes mellitus, hypothyroidism, anxiety, CAD, ICD placement, CHF, metastasized stage IV lung cancer . Now patient has presenting with new onset A fib with RVR. . Current medical needs: please see my assessment and plan . Predictability of an adverse outcome (risk): Patient has multiple comorbidities as listed above. Now presents with new onset with RVR with HR up to 150s. Pt  still needs bronchoscopy. Patient's presentation is highly complicated.  Patient is at high risk of deteriorating.  Will need to be treated in hospital for at least 2 days.     DVT ppx: on IV heparin Code Status: DNR (I discussed with patient and explained the meaning of [place].  Patient wants to be DNR) Family Communication: None at bed side.     Disposition Plan:  Anticipate discharge back to previous home environment Consults called:  none Admission status:  SDU/inpation       Date of Service 05/11/2019    [facility]   If 7PM-7AM, please contact night-coverage [email or web address] Password [facility] 05/11/2019, 5:51 PM

## 2019-05-11 NOTE — OR Nursing (Signed)
Dr. Fletcher Anon into see patient. He has reviewed EKG and interviewed patient. He would like patient to be transferred to ICU step down after conversing with Dr. Patsey Berthold.

## 2019-05-11 NOTE — Interval H&P Note (Signed)
History and Physical Interval Note:  05/11/2019 12:28 PM  Megan Shelton  has presented today for surgery, with the diagnosis of mediastinal adenopathy, mass.  The various methods of treatment have been discussed with the patient and family. After consideration of risks, benefits and other options for treatment, the patient has consented to  Procedure(s): [place] (N/A) as a surgical intervention.  The patient's history has been reviewed, patient examined, no change in status, stable for surgery.  I have reviewed the patient's chart and labs.  Questions were answered to the patient's satisfaction.     Vernard Gambles

## 2019-05-11 NOTE — Anesthesia Preprocedure Evaluation (Signed)
Anesthesia Evaluation  Patient identified by MRN, date of birth, ID band Patient awake    Reviewed: Allergy & Precautions, H&P , NPO status , Patient's Chart, lab work & pertinent test results  Airway        Dental   Pulmonary shortness of breath, former smoker,  Metastatic lung cancer          Cardiovascular hypertension, + CAD, + Past MI and +CHF  + Cardiac Defibrillator   MI in 2008 s/p stent, ischemic cardiopathy and ICD placement presumed for primary prevention.  Per most recent Cardiology note, she has had interval return of LV function.  No echo available for review.   Neuro/Psych PSYCHIATRIC DISORDERS Anxiety negative neurological ROS     GI/Hepatic negative GI ROS, Neg liver ROS,   Endo/Other  diabetes, Type 2, Oral Hypoglycemic Agents  Renal/GU      Musculoskeletal   Abdominal   Peds  Hematology negative hematology ROS (+)   Anesthesia Other Findings Past Medical History: No date: AICD (automatic cardioverter/defibrillator) present No date: Anxiety No date: CHF (congestive heart failure) (HCC) No date: Diabetes mellitus with neuropathy (HCC) No date: Dyspnea No date: Hypertension No date: Hypertensive heart disease No date: Implantable cardioverter-defibrillator (ICD)-[employer]tronic No date: Myocardial infarction, anterior wall (HCC) No date: Obesity, Class II, BMI 35-39.9  Past Surgical History: No date: CARPAL TUNNEL RELEASE No date: cataract surgery No date: EYE SURGERY No date: FRACTURE SURGERY; Left     Comment:  ARM No date: JOINT REPLACEMENT; Left     Comment:  tkr     Reproductive/Obstetrics negative OB ROS                             Anesthesia Physical Anesthesia Plan  ASA: III  Anesthesia Plan: General ETT   Post-op Pain Management:    Induction:   PONV Risk Score and Plan: Ondansetron, Dexamethasone, Midazolam and Treatment may vary due to age or  medical condition  Airway Management Planned:   Additional Equipment:   Intra-op Plan:   Post-operative Plan:   Informed Consent: I have reviewed the patients History and Physical, chart, labs and discussed the procedure including the risks, benefits and alternatives for the proposed anesthesia with the patient or authorized representative who has indicated his/her understanding and acceptance.     Dental Advisory Given  Plan Discussed with: Anesthesiologist, CRNA and Surgeon  Anesthesia Plan Comments:         Anesthesia Quick Evaluation

## 2019-05-11 NOTE — OR Nursing (Signed)
HR is ranging from 130's to 150's.

## 2019-05-11 NOTE — Transfer of Care (Signed)
Immediate Anesthesia Transfer of Care Note  Patient: Megan Shelton  Procedure(s) Performed: VIDEO BRONCHOSCOPY WITH ENDOBRONCHIAL ULTRASOUND (N/A )  Patient Location: Pre-Op  Anesthesia Type:None  Level of Consciousness: awake and alert   Airway & Oxygen Therapy: Patient Spontanous Breathing  Post-op Assessment: Report given to RN, Post -op Vital signs reviewed and stable and Post -op Vital signs reviewed and unstable, Anesthesiologist notified  Post vital signs: Reviewed, stable and unstable  Last Vitals:  Vitals Value Taken Time  BP 119/89 1346  Temp    Pulse 155 1346  Resp 18 1346  SpO2 100 % 1346    Last Pain:  Vitals:   05/11/19 1156  TempSrc: Temporal  PainSc: 0-No pain         Complications: No apparent anesthesia complications   Procedure cancelled prior to induction of anesthesia.  Patient was noted to have a heart rate of 155 upon connecting EKG leads.  Dr. Patsey Berthold and Dr. Randa Lynn aware.  Attempts made to slow heart rate and assess rhythm with 80 mg esmolol and 5 mg metoprolol in divided doses, however; heart rate remains > 110.  Blood pressure remains stable with no patient symptoms of dizziness or syncope.  Decision made to return patient to pre-op unit for cardiology consult.

## 2019-05-11 NOTE — OR Nursing (Signed)
Transported to PACU bay 8 for monitoring and to await step down bed placement.

## 2019-05-11 NOTE — Consult Note (Signed)
Cardiology Consultation:   Patient ID: Megan Shelton MRN: [id number]; DOB: Apr 06, 1941  Admit date: 05/11/2019 Date of Consult: 05/11/2019  Primary Care Provider: Mar Daring, PA-C Primary Cardiologist: Virl Axe, MD  Primary Electrophysiologist:  None    Patient Profile:   Megan Shelton is a 79 y.o. female with a hx of coronary artery disease, ischemic cardiomyopathy status post ICD placement and multiple other medical problems who is being seen today for the evaluation of tachycardia at the request of Dr. Patsey Berthold.  History of Present Illness:   Megan Shelton is a 79 year old female with known history of coronary artery disease.  She presented in 2008 with myocardial infarction and underwent stenting of the left circumflex.  Notes indicate totally occluded LAD.  She had an ICD placement done.  Most recent echocardiogram in 2018 showed normalization of LV systolic function. She has multiple chronic medical conditions that include type 2 diabetes, hypertension and obesity. Unfortunately, she was recently diagnosed with stage IV lung cancer with possibility of intracranial metastases.  She was seen by pulmonary and was scheduled for bronchoscopy today.  Initially, her heart rate was noted to be in the 70s.  However, before induction of anesthesia, she was noted to be tachycardic with heart rate around 150 bpm.  EKG showed atrial fibrillation which is a new diagnosis.  The patient denies any chest pain, shortness of breath or palpitations.  She has no prior history of atrial fibrillation.  She was given metoprolol and esmolol IV with no significant improvement.  Heart Pathway Score:     Past Medical History:  Diagnosis Date  . AICD (automatic cardioverter/defibrillator) present   . Anxiety   . CHF (congestive heart failure) ([place])   . Diabetes mellitus with neuropathy ([place])   . Dyspnea   . Hypertension   . Hypertensive heart disease   . Implantable cardioverter-defibrillator  (ICD)-Medtronic   . Myocardial infarction, anterior wall ([place])   . Obesity, Class II, BMI 35-39.9     Past Surgical History:  Procedure Laterality Date  . CARPAL TUNNEL RELEASE    . cataract surgery    . EYE SURGERY    . FRACTURE SURGERY Left    ARM  . JOINT REPLACEMENT Left    tkr     Home Medications:  Prior to Admission medications   Medication Sig Start Date End Date Taking? Authorizing Provider  atorvastatin (LIPITOR) 40 MG tablet TAKE 1 TABLET BY MOUTH  DAILY Patient taking differently: Take 40 mg by mouth daily.  01/12/19  Yes Burnette, [place] M, PA-C  clonazePAM (KLONOPIN) 1 MG tablet TAKE 1 TABLET BY MOUTH 3 TIMES A DAY AS NEEDED FOR ANXIETY Patient taking differently: Take 1 mg by mouth 3 (three) times daily as needed for anxiety.  11/21/18  Yes Fenton Malling M, PA-C  dexamethasone (DECADRON) 4 MG tablet Take 1 tablet (4 mg total) by mouth 2 (two) times daily with a meal. 05/08/19  Yes Sindy Guadeloupe, MD  furosemide (LASIX) 40 MG tablet TAKE 1 TABLET BY MOUTH EVERY DAY AS NEEDED Patient taking differently: Take 40 mg by mouth daily as needed for fluid.  05/12/18  Yes Mar Daring, PA-C  glimepiride (AMARYL) 1 MG tablet TAKE 1 TABLET BY MOUTH TWO  TIMES DAILY 01/12/19  Yes Mar Daring, PA-C  hydrocortisone 1 % ointment Apply 1 application topically 2 (two) times daily. May use for skin irritation on bilateral eyelids. Do not get inside of eyes. Patient taking differently: Apply 1 application topically  2 (two) times daily as needed for itching. May use for skin irritation on bilateral eyelids. Do not get inside of eyes. 03/04/19  Yes Mar Daring, PA-C  ibuprofen (ADVIL) 800 MG tablet Take 800 mg by mouth every 8 (eight) hours as needed for moderate pain.    Yes [provider]  levothyroxine (SYNTHROID) 112 MCG tablet TAKE 1 TABLET BY MOUTH  DAILY BEFORE BREAKFAST Patient taking differently: Take 112 mcg by mouth daily before breakfast.   01/12/19  Yes Burnette, Jennifer M, PA-C  lisinopril (ZESTRIL) 10 MG tablet TAKE 1 TABLET BY MOUTH  DAILY Patient taking differently: Take 10 mg by mouth daily.  01/12/19  Yes Fenton Malling M, PA-C  metFORMIN (GLUCOPHAGE-XR) 500 MG 24 hr tablet TAKE 1 TABLET BY MOUTH IN  THE MORNING AND 2 TABLETS  IN THE EVENING Patient taking differently: Take 500 mg by mouth 2 (two) times daily.  12/23/17  Yes Mar Daring, PA-C  metoprolol tartrate (LOPRESSOR) 25 MG tablet TAKE 1 TABLET BY MOUTH TWO  TIMES DAILY Patient taking differently: Take 25 mg by mouth 2 (two) times daily.  06/16/18  Yes Fenton Malling M, PA-C  ondansetron (ZOFRAN ODT) 4 MG disintegrating tablet Take 1 tablet (4 mg total) by mouth every 8 (eight) hours as needed for nausea or vomiting. 05/08/19  Yes Sindy Guadeloupe, MD  oxyCODONE (OXY IR/ROXICODONE) 5 MG immediate release tablet Take 1 tablet (5 mg total) by mouth every 4 (four) hours as needed for severe pain. 05/08/19  Yes Sindy Guadeloupe, MD  pyridOXINE (VITAMIN B-6) 100 MG tablet Take 100 mg by mouth daily.   Yes [provider]  sitaGLIPtin (JANUVIA) 100 MG tablet Take 100 mg by mouth daily.   Yes [provider]  vitamin B-12 (CYANOCOBALAMIN) 100 MCG tablet Take 100 mcg by mouth daily.   Yes [provider]  Blood Glucose Monitoring Suppl (ACCU-CHEK COMPACT CARE KIT) KIT To check blood sugar daily. 11/24/18   Mar Daring, PA-C  Blood Glucose Monitoring Suppl (ACCU-CHEK GUIDE) w/Device KIT  11/24/18   [provider]  glucose blood (ACCU-CHEK COMPACT PLUS) test strip To check blood sugar daily. 11/24/18   Mar Daring, PA-C  Lancets (ACCU-CHEK SOFT TOUCH) lancets To check blood sugar daily. 11/24/18   Mar Daring, PA-C  ONETOUCH DELICA LANCETS 35D MISC CHECK BLOOD SUGAR ONCE  DAILY 10/11/17   Mar Daring, PA-C    Inpatient Medications: Scheduled Meds: . butamben-tetracaine-benzocaine  1 spray Topical Once  .  diltiazem  10 mg Intravenous Once   Continuous Infusions: . sodium chloride 75 mL/hr at 05/11/19 1230  . diltiazem (CARDIZEM) infusion     PRN Meds:   Allergies:    Allergies  Allergen Reactions  . Latex Rash  . Nickel Rash    Social History:   Social History   Socioeconomic History  . Marital status: Widowed    Spouse name: Not on file  . Number of children: 3  . Years of education: Not on file  . Highest education level: High school graduate  Occupational History  . Occupation: retired  Tobacco Use  . Smoking status: Former Smoker    Packs/day: 1.00    Years: 55.00    Pack years: 55.00    Types: Cigarettes    Quit date: 2006    Years since quitting: 15.0  . Smokeless tobacco: Never Used  . Tobacco comment: quit about 18 years ago; as of 2019  Substance  and Sexual Activity  . Alcohol use: No  . Drug use: No  . Sexual activity: Not on file  Other Topics Concern  . Not on file  Social History Narrative  . Not on file   Social Determinants of Health   Financial Resource Strain:   . Difficulty of Paying Living Expenses: Not on file  Food Insecurity:   . Worried About Charity fundraiser in the Last Year: Not on file  . Ran Out of Food in the Last Year: Not on file  Transportation Needs:   . Lack of Transportation (Medical): Not on file  . Lack of Transportation (Non-Medical): Not on file  Physical Activity: Inactive  . Days of Exercise per Week: 0 days  . Minutes of Exercise per Session: 0 min  Stress: No Stress Concern Present  . Feeling of Stress : Not at all  Social Connections: Unknown  . Frequency of Communication with Friends and Family: Patient refused  . Frequency of Social Gatherings with Friends and Family: Patient refused  . Attends Religious Services: Patient refused  . Active Member of Clubs or Organizations: Patient refused  . Attends Archivist Meetings: Patient refused  . Marital Status: Patient refused  Intimate Partner  Violence: Unknown  . Fear of Current or Ex-Partner: Patient refused  . Emotionally Abused: Patient refused  . Physically Abused: Patient refused  . Sexually Abused: Patient refused    Family History:    Family History  Problem Relation Age of Onset  . Scleroderma Mother   . Raynaud syndrome Mother   . Cancer Father 61       colon cancer  . Heart disease Brother        died of heart attack  . Diabetes Brother      ROS:  Please see the history of present illness.   All other ROS reviewed and negative.     Physical Exam/Data:   Vitals:   05/11/19 1156 05/11/19 1341  BP: (!) 119/103 115/90  Pulse: 60 (!) 149  Resp: 16 18  Temp: (!) 97.3 F (36.3 C)   TempSrc: Temporal   SpO2: 97% 96%  Weight: 71.2 kg   Height: 5' (1.524 m)    No intake or output data in the 24 hours ending 05/11/19 1421 Last 3 Weights 05/11/2019 05/07/2019 05/05/2019  Weight (lbs) 157 lb 160 lb 161 lb 8 oz  Weight (kg) 71.215 kg 72.576 kg 73.256 kg     Body mass index is 30.66 kg/m.  General:  Well nourished, well developed, in no acute distress HEENT: normal Lymph: no adenopathy Neck: no JVD Endocrine:  No thryomegaly Vascular: No carotid bruits; FA pulses 2+ bilaterally without bruits  Cardiac:  normal S1, S2; irregularly irregular and tachycardic; no murmur  Lungs:  clear to auscultation bilaterally, no wheezing, rhonchi or rales  Abd: soft, nontender, no hepatomegaly  Ext: no edema Musculoskeletal:  No deformities, BUE and BLE strength normal and equal Skin: warm and dry  Neuro:  CNs 2-12 intact, no focal abnormalities noted Psych:  Normal affect   EKG:  The EKG was personally reviewed and demonstrates: Atrial fibrillation with RVR.  Right bundle branch block. Telemetry:  Telemetry was personally reviewed and demonstrates: Atrial fibrillation  Relevant CV Studies: Echocardiogram will be ordered.  Laboratory Data:  High Sensitivity Troponin:  No results for input(s): TROPONINIHS in the  last 720 hours.   ChemistryNo results for input(s): NA, K, CL, CO2, GLUCOSE, BUN, CREATININE, CALCIUM, GFRNONAA, GFRAA,  ANIONGAP in the last 168 hours.  No results for input(s): PROT, ALBUMIN, AST, ALT, ALKPHOS, BILITOT in the last 168 hours. Hematology Recent Labs  Lab 05/08/19 0905  WBC 9.5  RBC 5.30*  HGB 14.4  HCT 44.5  MCV 84.0  MCH 27.2  MCHC 32.4  RDW 14.8  PLT 232   BNPNo results for input(s): BNP, PROBNP in the last 168 hours.  DDimer No results for input(s): DDIMER in the last 168 hours.   Radiology/Studies:  CT Head W Wo Contrast  Result Date: 05/08/2019 CLINICAL DATA:  Abnormal PET-CT with a left lung mass, bone metastases, and hypodensity in the left cerebral hemisphere. EXAM: CT HEAD WITHOUT AND WITH CONTRAST TECHNIQUE: Contiguous axial images were obtained from the base of the skull through the vertex without and with intravenous contrast CONTRAST:  64m OMNIPAQUE IOHEXOL 300 MG/ML  SOLN COMPARISON:  PET-CT 04/27/2019 FINDINGS: Brain: There is no evidence of acute infarct, intracranial hemorrhage, midline shift, or extra-axial fluid collection. A chronic left parietal infarct is noted. Bilateral cerebral white matter hypodensities are nonspecific but compatible with mild chronic small vessel ischemic disease. Mild cerebral atrophy is not greater than expected for age. There is an 11 x 8 mm enhancing mass anteriorly in the left frontal lobe with mild edema. Additional enhancing lesions are questioned in the right frontal lobe measuring 3 mm (series 4, image 16 and series 6, image 13) and at the right temporoparietal junction measuring 3 mm (series 6, image 10). Vascular: Calcified atherosclerosis at the skull base. Patent dural venous sinuses. Skull: 5 mm lucent lesion in the left frontal skull (series 3, image 58). Sinuses/Orbits: Mild mucosal thickening or small volume fluid in the ethmoid and sphenoid sinuses bilaterally. Clear mastoid air cells. Unremarkable visualized  orbits. Other: None. IMPRESSION: 1. 11 mm left frontal lobe mass with mild edema consistent with metastatic disease. 2. Two questionable additional lesions measuring 3 mm in the right frontal lobe and at the right temporoparietal junction. 3. Indeterminate 5 mm left frontal skull lesion. 4. 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 Electronically Signed   By: ALogan BoresM.D.   On: 05/08/2019 08:50     Assessment and Plan:   1. New onset atrial fibrillation with rapid ventricular response: The patient was in sinus rhythm on presentation as her heart rate was normal.  Before induction of anesthesia, she developed atrial fibrillation with RVR.  This is her first documented episode.  Given no improvement in heart rate with IV metoprolol and esmolol, I recommend admission to stepdown or telemetry.  Most recent echocardiogram showed normal LV systolic function.  Thus, we can use diltiazem drip for rate control and this was ordered.  I recommend checking routine labs including CBC, basic metabolic profile, magnesium and TSH.  I will order an echocardiogram to be done once heart rate is improved.  Chads vas score is at least 5.  Thus, I recommend long-term anticoagulation.  In the short-term, I recommend heparin drip given that patient is expected to have bronchoscopy done.  It seems that she is also scheduled for a port placement in few days.  Given the need for long-term anticoagulation, it might be best to perform all invasive procedures while hospitalized and then oral anticoagulation can be started.  If ventricular rate is difficult to control and she remains in atrial fibrillation, cardioversion can be considered tomorrow even without TEE given that duration is less than 48 hours. 2. Coronary artery disease: The  patient denies any anginal symptoms.  Check troponin. 3. History of ischemic cardiomyopathy: Most recent ejection fraction was reported to be normal.  Recheck  echocardiogram.  Continue metoprolol and lisinopril. 4. Status post ICD placement: Followed by Dr. Caryl Comes.      For questions or updates, please contact [place] Please consult [email or web address] for contact info under     Signed, Kathlyn Sacramento, MD  05/11/2019 2:21 PM

## 2019-05-11 NOTE — Progress Notes (Signed)
[place] for heparin Indication: atrial fibrillation  Allergies  Allergen Reactions  . Latex Rash  . Nickel Rash    Patient Measurements: Height: 5' (152.4 cm) Weight: 157 lb (71.2 kg) IBW/kg (Calculated) : 45.5 Heparin Dosing Weight: 61 kg  Vital Signs: Temp: 98.8 F (37.1 C) (02/01 1725) Temp Source: Temporal (02/01 1156) BP: 113/70 (02/01 1725) Pulse Rate: 80 (02/01 1725)  Labs: Recent Labs    05/11/19 1418 05/11/19 1818 05/11/19 1932  HGB 14.0  --   --   HCT 42.6  --   --   PLT 287  --   --   APTT  --  27  --   LABPROT  --  14.2  --   INR  --  1.1  --   CREATININE 0.55  --   --   TROPONINIHS 40* 38* 41*    Estimated Creatinine Clearance: 51.9 mL/min (by C-G formula based on SCr of 0.55 mg/dL).   Medical History: Past Medical History:  Diagnosis Date  . AICD (automatic cardioverter/defibrillator) present   . Anxiety   . CHF (congestive heart failure) ([place])   . Diabetes mellitus with neuropathy ([place])   . Dyspnea   . Hypertension   . Hypertensive heart disease   . Implantable cardioverter-defibrillator (ICD)-Medtronic   . Myocardial infarction, anterior wall ([place])   . Obesity, Class II, BMI 35-39.9     Assessment: 79 year old female with new onset afib w/ RVR. Cardiology consulted, recommend anticoagulation with heparin short term until invasive procedures performed. CHA2DS2VASc ~ 5 and will likely transition to PO anticoagulation long-term once all invasive procedures performed. Pharmacy consulted for heparin drip at this time.  Goal of Therapy:  Heparin level 0.3-0.7 units/ml Monitor platelets by anticoagulation protocol: Yes   Plan:  Heparin 4000 unit bolus followed by heparin drip at 850 units/hr. Spoke with ICU nurse as patient just recently transferred and heparin drip not yet started. Documented at 26. Will order HL 2/2 at 0300. CBC daily while on heparin drip.  Tawnya Crook, PharmD 05/11/2019,8:15  PM

## 2019-05-11 NOTE — OR Nursing (Addendum)
Patient arrived from procedure room accompanied by Dr.[place]en and CRNA anesthesia. He reports they had to abort procedure due to patient's rapid heart rate. Pt. Is asymptomatic, denies SOB, chest pain or any pain at all. She is alert and oriented. Awaiting cardiologist to come and evaluate.  Dr. Patsey Berthold is arranging this. 12 lead EKG performed.

## 2019-05-11 NOTE — Progress Notes (Signed)
Patient now in SDU on Cardizem and heparin infusions.  We will continue to follow.  Hopefully can get rhythm controlled so that she can have needed bronchoscopy to determine type of malignancy so she can receive therapy.  Will plan tentatively for Wednesday if anesthesia can accommodate.  The patient also needs a Port-A-Cath placed and I will contact Dr. Lucky Cowboy in the morning to try to coordinate placement while patient is in-house.  Appreciate input from Dr. Fletcher Anon.  Appreciate hospitalist service for their excellent care.  Renold Don, MD [facility]

## 2019-05-11 NOTE — OR Nursing (Signed)
Dr. Patsey Berthold at bedside multiply times, waited on hospitalist to come and write orders, son came back to visit patient for a moment and has been updated on what is next.  Patient is going to try to rest and take a nap.  Just increased cardizem drip due to increased heart rate.  Patient ate lunch and tolerated well.

## 2019-05-11 NOTE — Significant Event (Signed)
Patient was taken to the Procedure Room 2 (Bronchoscopy Suite) upon placing the patient on the monitor prior to the procedure she was noted to be in a supraventricular tachycardia with rapid ventricular response in the 160s.  The patient was asymptomatic with regards to chest pain or even sensation of palpitation.  In the preoperative area she had been in the 60-70 range pulse wise.  Patient was taken back to the preoperative area when she failed to respond to esmolol and metoprolol IV to control her heart rate.  In the preoperative area she had an EKG that showed atrial fibrillation with rapid ventricular response.  She will need to be admitted for control of her heart rate.  I will have cardiology evaluate the patient.  I have spoken with Dr. Fletcher Anon.  Plan will be to have the patient have her procedures done as an inpatient.  She will need anticoagulation and rate control with Cardizem.   Renold Don, MD [facility]

## 2019-05-11 NOTE — Progress Notes (Signed)
Radiation Oncology Follow up Note  Name: Megan Shelton   Date:   05/11/2019 MRN:  [id number] DOB: [birth year]    This 79 y.o. female presents to the clinic today for reevaluation for brain metastasis and patient with known stage IV lung cancer currently under treatment to her left humerus.  REFERRING PROVIDER: Florian Buff*  HPI: Patient is a 79 year old female with known stage IV lung cancer currently under treatment to her left humerus status post pathologic fracture..  By PET/CT she has a left upper lobe mass extending into the left lower lobe consistent with primary lung cancer.  She had an CT scan of her brain showing 11 mm left frontal mass as well as 2 additional lesions in the right frontal lobe and right temporal parietal junction as well as is a 5 mm left frontal skull lesion all consistent with metastatic disease.  She is seen today for evaluation of brain mets.  She specifically denies change in visual fields or any focal neurologic deficit she is currently on steroids.  She is seen today for consideration of whole brain radiation.  Patient also has by PET/CT left hip mass metastatic disease and she is complaining of left hip pain. COMPLICATIONS OF TREATMENT: none  FOLLOW UP COMPLIANCE: keeps appointments   PHYSICAL EXAM:  BP 120/75 (BP Location: Right Arm, Patient Position: Sitting)   Pulse (!) 115   Resp 18  Crude visual fields within normal range motor or sensory and DTR levels are equal and symmetric in upper lower extremities.  Proprioception is intact.  Well-developed well-nourished patient in NAD. HEENT reveals PERLA, EOMI, discs not visualized.  Oral cavity is clear. No oral mucosal lesions are identified. Neck is clear without evidence of cervical or supraclavicular adenopathy. Lungs are clear to A&P. Cardiac examination is essentially unremarkable with regular rate and rhythm without murmur rub or thrill. Abdomen is benign with no organomegaly or masses noted. Motor  sensory and DTR levels are equal and symmetric in the upper and lower extremities. Cranial nerves II through XII are grossly intact. Proprioception is intact. No peripheral adenopathy or edema is identified. No motor or sensory levels are noted. Crude visual fields are within normal range.  RADIOLOGY RESULTS: Head CT is reviewed.  PLAN: This time like to start whole brain radiation therapy.  I will plan on delivering 3000 cGy in 10 fractions.  She is being simulated today for that treatment.  Also consider later in the week to start a short course 2000 cGy in 5 fractions to her left hip for metastatic disease.  Risks and benefits of treatment occluding hair loss fatigue cognitive decline possible skin reaction and hair loss all were described in detail to the patient.  She seems to comprehend my treatment plan well.  I have personally set up and ordered CT simulation for today.  I would like to take this opportunity to thank you for allowing me to participate in the care of your patient.Noreene Filbert, MD

## 2019-05-12 ENCOUNTER — Ambulatory Visit: Payer: Medicare Other

## 2019-05-12 ENCOUNTER — Encounter: Payer: Self-pay | Admitting: Internal Medicine

## 2019-05-12 ENCOUNTER — Inpatient Hospital Stay: Payer: Medicare Other | Admitting: Anesthesiology

## 2019-05-12 ENCOUNTER — Telehealth: Payer: Self-pay

## 2019-05-12 ENCOUNTER — Ambulatory Visit: Payer: Medicare Other | Admitting: Oncology

## 2019-05-12 ENCOUNTER — Encounter
Admission: AD | Disposition: A | Discharge status: Home / Self Care | Payer: Self-pay | Source: Home / Self Care | Attending: Internal Medicine

## 2019-05-12 ENCOUNTER — Other Ambulatory Visit: Payer: Self-pay

## 2019-05-12 ENCOUNTER — Other Ambulatory Visit: Payer: Medicare Other

## 2019-05-12 ENCOUNTER — Inpatient Hospital Stay (HOSPITAL_COMMUNITY)
Admission: AD | Admit: 2019-05-12 | Discharge: 2019-05-12 | Disposition: A | Discharge status: Home / Self Care | Payer: Medicare Other | Source: Home / Self Care | Attending: Internal Medicine | Admitting: Internal Medicine

## 2019-05-12 DIAGNOSIS — R918 Other nonspecific abnormal finding of lung field: Secondary | ICD-10-CM

## 2019-05-12 DIAGNOSIS — I340 Nonrheumatic mitral (valve) insufficiency: Secondary | ICD-10-CM

## 2019-05-12 DIAGNOSIS — Z6839 Body mass index (BMI) 39.0-39.9, adult: Secondary | ICD-10-CM

## 2019-05-12 DIAGNOSIS — E119 Type 2 diabetes mellitus without complications: Secondary | ICD-10-CM

## 2019-05-12 DIAGNOSIS — I252 Old myocardial infarction: Secondary | ICD-10-CM

## 2019-05-12 DIAGNOSIS — Z87891 Personal history of nicotine dependence: Secondary | ICD-10-CM

## 2019-05-12 DIAGNOSIS — I4891 Unspecified atrial fibrillation: Secondary | ICD-10-CM

## 2019-05-12 DIAGNOSIS — C7931 Secondary malignant neoplasm of brain: Secondary | ICD-10-CM

## 2019-05-12 DIAGNOSIS — C7951 Secondary malignant neoplasm of bone: Secondary | ICD-10-CM

## 2019-05-12 DIAGNOSIS — I10 Essential (primary) hypertension: Secondary | ICD-10-CM

## 2019-05-12 DIAGNOSIS — I255 Ischemic cardiomyopathy: Secondary | ICD-10-CM

## 2019-05-12 DIAGNOSIS — E6609 Other obesity due to excess calories: Secondary | ICD-10-CM

## 2019-05-12 HISTORY — PX: CARDIOVERSION: SHX1299

## 2019-05-12 LAB — ECHOCARDIOGRAM COMPLETE
Height: 60 in
Weight: 2512 oz

## 2019-05-12 LAB — HEPARIN LEVEL (UNFRACTIONATED)
Heparin Unfractionated: 0.44 IU/mL (ref 0.30–0.70)
Heparin Unfractionated: 0.29 IU/mL — ABNORMAL LOW (ref 0.30–0.70)
Heparin Unfractionated: 0.4 IU/mL (ref 0.30–0.70)

## 2019-05-12 LAB — MRSA PCR SCREENING: MRSA by PCR: NEGATIVE

## 2019-05-12 LAB — CBC
HCT: 41 % (ref 36.0–46.0)
Hemoglobin: 13.1 g/dL (ref 12.0–15.0)
MCH: 27.5 pg (ref 26.0–34.0)
MCHC: 32 g/dL (ref 30.0–36.0)
MCV: 86.1 fL (ref 80.0–100.0)
Platelets: 247 10*3/uL (ref 150–400)
RBC: 4.76 MIL/uL (ref 3.87–5.11)
RDW: 15.8 % — ABNORMAL HIGH (ref 11.5–15.5)
WBC: 11.1 10*3/uL — ABNORMAL HIGH (ref 4.0–10.5)
nRBC: 0 % (ref 0.0–0.2)

## 2019-05-12 LAB — PROTIME-INR
INR: 1.1 (ref 0.8–1.2)
Prothrombin Time: 14.2 seconds (ref 11.4–15.2)

## 2019-05-12 LAB — BASIC METABOLIC PANEL
Anion gap: 9 (ref 5–15)
BUN: 21 mg/dL (ref 8–23)
CO2: 25 mmol/L (ref 22–32)
Calcium: 7.8 mg/dL — ABNORMAL LOW (ref 8.9–10.3)
Chloride: 104 mmol/L (ref 98–111)
Creatinine, Ser: 0.65 mg/dL (ref 0.44–1.00)
GFR calc Af Amer: >60 mL/min (ref >60)
GFR calc non Af Amer: >60 mL/min (ref >60)
Glucose, Bld: 266 mg/dL — ABNORMAL HIGH (ref 70–99)
Potassium: 4.4 mmol/L (ref 3.5–5.1)
Sodium: 138 mmol/L (ref 135–145)

## 2019-05-12 LAB — GLUCOSE, CAPILLARY
Glucose-Capillary: 209 mg/dL — ABNORMAL HIGH (ref 70–99)
Glucose-Capillary: 203 mg/dL — ABNORMAL HIGH (ref 70–99)
Glucose-Capillary: 254 mg/dL — ABNORMAL HIGH (ref 70–99)
Glucose-Capillary: 263 mg/dL — ABNORMAL HIGH (ref 70–99)

## 2019-05-12 LAB — T4, FREE: Free T4: 2.01 ng/dL — ABNORMAL HIGH (ref 0.61–1.12)

## 2019-05-12 SURGERY — CARDIOVERSION
Anesthesia: General

## 2019-05-12 MED ORDER — PROPOFOL 10 MG/ML IV BOLUS
INTRAVENOUS | Status: DC | PRN
  Administered 2019-05-12: 60 mg via INTRAVENOUS

## 2019-05-12 MED ORDER — HEPARIN BOLUS VIA INFUSION
900.0000 [IU] | Freq: Once | INTRAVENOUS | Status: AC
  Administered 2019-05-12: 900 [IU] via INTRAVENOUS
  Filled 2019-05-12: qty 900

## 2019-05-12 NOTE — Progress Notes (Signed)
[place] for heparin Indication: atrial fibrillation  Allergies  Allergen Reactions  . Latex Rash  . Nickel Rash    Patient Measurements: Height: 5' (152.4 cm) Weight: 157 lb (71.2 kg) IBW/kg (Calculated) : 45.5 Heparin Dosing Weight: 61 kg  Vital Signs: Temp: 98.7 F (37.1 C) (02/02 0800) Temp Source: Oral (02/02 0800) BP: 133/89 (02/02 1200) Pulse Rate: 68 (02/02 1200)  Labs: Recent Labs    05/11/19 1418 05/11/19 1418 05/11/19 1818 05/11/19 1932 05/11/19 2305 05/12/19 0247 05/12/19 1134  HGB 14.0  --   --   --   --  13.1  --   HCT 42.6  --   --   --   --  41.0  --   PLT 287  --   --   --   --  247  --   APTT  --   --  27  --   --   --   --   LABPROT  --   --  14.2  --   --   --   --   INR  --   --  1.1  --   --   --   --   HEPARINUNFRC  --   --   --   --   --  0.44 0.29*  CREATININE 0.55  --   --   --   --  0.65  --   TROPONINIHS 40*   < > 38* 41* 37*  --   --    < > = values in this interval not displayed.    Estimated Creatinine Clearance: 51.9 mL/min (by C-G formula based on SCr of 0.65 mg/dL).   Medical History: Past Medical History:  Diagnosis Date  . AICD (automatic cardioverter/defibrillator) present   . Anxiety   . CHF (congestive heart failure) ([place])   . Diabetes mellitus with neuropathy ([place])   . Dyspnea   . Hypertension   . Hypertensive heart disease   . Implantable cardioverter-defibrillator (ICD)-Medtronic   . Myocardial infarction, anterior wall ([place])   . Obesity, Class II, BMI 35-39.9     Assessment: 79 year old female with new onset afib w/ RVR. Cardiology consulted, recommend anticoagulation with heparin short term until invasive procedures performed. CHA2DS2VASc ~ 5 and will likely transition to PO anticoagulation long-term once all invasive procedures performed. Pharmacy consulted for heparin drip at this time.  2/2 0247 HL 0.44, therapeutic x 1.  CBC stable.  2/2 1134 HL 0.29, bolus 900  units, increase rate to 950 units/hr  Goal of Therapy:  Heparin level 0.3-0.7 units/ml Monitor platelets by anticoagulation protocol: Yes   Plan:  2/2 1134 HL 0.29, slightly below therapeutic. Verified with RN that Heparin drip had not been paused during procedure. Will order Heparin 900 units IV bolus and increase rate to 950 units/hr. Check HL in 8 hours. CBC daily while on heparin drip.  Paulina Fusi, PharmD, BCPS 05/12/2019 1:12 PM

## 2019-05-12 NOTE — Consult Note (Signed)
Hematology/Oncology Consult note [facility] [phone] [phone]  Patient Care Team: Rubye Beach as PCP - General (Family Medicine) Deboraha Sprang, MD as PCP - Cardiology (Cardiology) Telford Nab, RN as Registered Nurse   Name of the patient: Megan Shelton  [id number]  [birth year]    Reason for consult: lung mass/ brain and bone mets   Requesting physician: Dr. 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  She was undergoing palliative radiation to her left humerus as an outpatient.  She is yet to have a tissue diagnosis and was supposed to get bronchoscopy guided biopsy on 05/11/2019.  Even prior to starting the biopsy patient went into atrial fibrillation requiring inpatient admission and ultimately required cardioversion.  She is also yet to start her whole brain radiation treatment  Patient feels anxious and overwhelmed.  Her heart rate has been in the 70s to 80s since her cardioversion  ECOG PS- 2  Pain scale- 3   Review of systems- Review of Systems  Constitutional: Positive for malaise/fatigue. Negative for chills, fever and weight loss.  HENT: Negative for congestion, ear discharge and nosebleeds.   Eyes: Negative for blurred vision.  Respiratory: Negative for cough, hemoptysis, sputum production, shortness of breath and wheezing.   Cardiovascular: Negative for chest pain, palpitations, orthopnea and claudication.  Gastrointestinal: Negative for abdominal pain, blood in stool, constipation, diarrhea, heartburn, melena, nausea and vomiting.  Genitourinary: Negative for dysuria, flank pain, frequency, hematuria and urgency.  Musculoskeletal: Negative for back pain, joint pain and myalgias.       Left Arm pain  Skin: Negative for rash.  Neurological: Negative for dizziness,  tingling, focal weakness, seizures, weakness and headaches.  Endo/Heme/Allergies: Does not bruise/bleed easily.  Psychiatric/Behavioral: Negative for depression and suicidal ideas. The patient is nervous/anxious. The patient does not have insomnia.     Allergies  Allergen Reactions  . Latex Rash  . Nickel Rash    Patient Active Problem List   Diagnosis Date Noted  . Lung cancer-metastasized 05/11/2019  . Atrial fibrillation with RVR ([place]) 05/11/2019  . Goals of care, counseling/discussion 04/30/2019  . Bone metastases ([place]) 04/30/2019  . Cardiac defibrillator in place 04/20/2019  . Eyelid dermatitis, allergic/contact- bilateral  02/25/2019  . Closed displaced transverse fracture of shaft of left humerus 02/20/2019  . Coronary artery disease involving native coronary artery of native heart without angina pectoris 01/15/2019  . Chronic HFrEF (heart failure with reduced ejection fraction) ([place]) 01/15/2019  . Ischemic cardiomyopathy 01/15/2019  . Hyperlipidemia LDL goal <70 01/15/2019  . GAD (generalized anxiety disorder) 05/16/2018  . Allergic rhinitis 05/03/2017  . Arthritis 05/03/2017  . Cataract 05/03/2017  . Congestive heart failure ([place]) 05/03/2017  . Diabetes mellitus with neuropathy ([place]) 05/03/2017  . Essential hypertension 05/03/2017  . History of heart attack 05/03/2017  . H/O thyroidectomy 05/03/2017  . H/O total knee replacement 05/03/2017     Past Medical History:  Diagnosis Date  . AICD (automatic cardioverter/defibrillator) present   . Anxiety   . CHF (congestive heart failure) ([place])   . Diabetes mellitus with neuropathy ([place])   . Dyspnea   . Hypertension   . Hypertensive heart disease   . Implantable cardioverter-defibrillator (ICD)-[employer]   . Myocardial infarction, anterior wall ([place])   . Obesity, Class II, BMI 35-39.9      Past Surgical History:  Procedure Laterality Date  .  CARPAL TUNNEL RELEASE    . cataract surgery    . EYE SURGERY    .  FRACTURE SURGERY Left    ARM  . JOINT REPLACEMENT Left    tkr  . VIDEO BRONCHOSCOPY WITH ENDOBRONCHIAL ULTRASOUND N/A 05/11/2019   Procedure: VIDEO BRONCHOSCOPY WITH ENDOBRONCHIAL ULTRASOUND;  Surgeon: Tyler Pita, MD;  Location: [facility] ORS;  Service: Pulmonary;  Laterality: N/A;    Social History   Socioeconomic History  . Marital status: Widowed    Spouse name: Not on file  . Number of children: 3  . Years of education: Not on file  . Highest education level: High school graduate  Occupational History  . Occupation: retired  Tobacco Use  . Smoking status: Former Smoker    Packs/day: 1.00    Years: 55.00    Pack years: 55.00    Types: Cigarettes    Quit date: 2006    Years since quitting: 15.0  . Smokeless tobacco: Never Used  . Tobacco comment: quit about 18 years ago; as of 2019  Substance and Sexual Activity  . Alcohol use: No  . Drug use: No  . Sexual activity: Not on file  Other Topics Concern  . Not on file  Social History Narrative  . Not on file   Social Determinants of Health   Financial Resource Strain:   . Difficulty of Paying Living Expenses: Not on file  Food Insecurity:   . Worried About Charity fundraiser in the Last Year: Not on file  . Ran Out of Food in the Last Year: Not on file  Transportation Needs:   . Lack of Transportation (Medical): Not on file  . Lack of Transportation (Non-Medical): Not on file  Physical Activity: Inactive  . Days of Exercise per Week: 0 days  . Minutes of Exercise per Session: 0 min  Stress: No Stress Concern Present  . Feeling of Stress : Not at all  Social Connections: Unknown  . Frequency of Communication with Friends and Family: Patient refused  . Frequency of Social Gatherings with Friends and Family: Patient refused  . Attends Religious Services: Patient refused  . Active Member of Clubs or Organizations: Patient refused  . Attends Archivist Meetings: Patient refused  . Marital Status: Patient  refused  Intimate Partner Violence: Unknown  . Fear of Current or Ex-Partner: Patient refused  . Emotionally Abused: Patient refused  . Physically Abused: Patient refused  . Sexually Abused: Patient refused     Family History  Problem Relation Age of Onset  . Scleroderma Mother   . Raynaud syndrome Mother   . Cancer Father 76       colon cancer  . Heart disease Brother        died of heart attack  . Diabetes Brother      Current Facility-Administered Medications:  .  0.9 %  sodium chloride infusion, , Intravenous, Continuous, Ivor Costa, MD, Last Rate: 75 mL/hr at 05/12/19 1215, Rate Verify at 05/12/19 1215 .  acetaminophen (TYLENOL) tablet 650 mg, 650 mg, Oral, Q6H PRN **OR** acetaminophen (TYLENOL) suppository 650 mg, 650 mg, Rectal, Q6H PRN, Ivor Costa, MD .  atorvastatin (LIPITOR) tablet 40 mg, 40 mg, Oral, Daily, Ivor Costa, MD, 40 mg at 05/12/19 1106 .  butamben-tetracaine-benzocaine (CETACAINE) spray 1 spray, 1 spray, Topical, Once, Ivor Costa, MD .  Chlorhexidine Gluconate Cloth 2 % PADS 6 each, 6 each, Topical, Daily, Tyler Pita, MD, 6 each at 05/11/19 2215 .  clonazePAM (KLONOPIN) tablet 1 mg, 1 mg, Oral, TID PRN, Ivor Costa, MD .  dexamethasone (DECADRON) tablet 4 mg, 4 mg, Oral, BID WC, Ivor Costa, MD, 4 mg at 05/12/19 0758 .  diltiazem (CARDIZEM) 125 mg in dextrose 5% 125 mL (1 mg/mL) infusion, 5-15 mg/hr, Intravenous, Titrated, Ivor Costa, MD, Stopped at 05/12/19 [phone] .  guaiFENesin-dextromethorphan (ROBITUSSIN DM) 100-10 MG/5ML syrup 5 mL, 5 mL, Oral, Q4H PRN, Ivor Costa, MD, 5 mL at 05/11/19 1913 .  heparin ADULT infusion 100 units/mL (25000 units/272mL sodium chloride 0.45%), 950 Units/hr, Intravenous, Continuous, Vira Blanco, RPH, Last Rate: 9.5 mL/hr at 05/12/19 1320, 950 Units/hr at 05/12/19 1320 .  hydrALAZINE (APRESOLINE) tablet 25 mg, 25 mg, Oral, TID PRN, Ivor Costa, MD .  hydrocortisone 1 % ointment 1 application, 1 application, Topical, BID PRN,  Ivor Costa, MD .  influenza vaccine adjuvanted (FLUAD) injection 0.5 mL, 0.5 mL, Intramuscular, Tomorrow-1000, Vernard Gambles L, MD .  insulin aspart (novoLOG) injection 0-5 Units, 0-5 Units, Subcutaneous, Gordan Payment, MD, 2 Units at 05/11/19 2213 .  insulin aspart (novoLOG) injection 0-9 Units, 0-9 Units, Subcutaneous, TID WC, Ivor Costa, MD, 3 Units at 05/12/19 1149 .  levothyroxine (SYNTHROID) tablet 112 mcg, 112 mcg, Oral, Q0600, Ivor Costa, MD, 112 mcg at 05/12/19 0559 .  lisinopril (ZESTRIL) tablet 10 mg, 10 mg, Oral, Daily, Ivor Costa, MD, 10 mg at 05/12/19 1105 .  metoprolol tartrate (LOPRESSOR) tablet 25 mg, 25 mg, Oral, BID, Ivor Costa, MD, 25 mg at 05/12/19 1107 .  ondansetron (ZOFRAN) tablet 4 mg, 4 mg, Oral, Q6H PRN **OR** ondansetron (ZOFRAN) injection 4 mg, 4 mg, Intravenous, Q6H PRN, Ivor Costa, MD .  oxyCODONE (Oxy IR/ROXICODONE) immediate release tablet 5 mg, 5 mg, Oral, Q4H PRN, Ivor Costa, MD, 5 mg at 05/12/19 1941 .  pyridOXINE (VITAMIN B-6) tablet 100 mg, 100 mg, Oral, Daily, Ivor Costa, MD, 100 mg at 05/12/19 1107 .  vitamin B-12 (CYANOCOBALAMIN) tablet 100 mcg, 100 mcg, Oral, Daily, Ivor Costa, MD, 100 mcg at 05/12/19 1105   Physical exam:  Vitals:   05/12/19 1100 05/12/19 1200 05/12/19 1300 05/12/19 1400  BP: 135/71 133/89 131/65 130/71  Pulse: 72 68 63 73  Resp: 20 17 16 16   Temp:    97.9 F (36.6 C)  TempSrc:    Oral  SpO2: 99% 96% 93% 94%  Weight:      Height:       Physical Exam HENT:     Head: Normocephalic and atraumatic.  Eyes:     Pupils: Pupils are equal, round, and reactive to light.  Cardiovascular:     Rate and Rhythm: Normal rate. Rhythm irregular.     Heart sounds: Normal heart sounds.  Pulmonary:     Effort: Pulmonary effort is normal.     Breath sounds: Normal breath sounds.  Abdominal:     General: Bowel sounds are normal.     Palpations: Abdomen is soft.  Musculoskeletal:     Cervical back: Normal range of motion.  Skin:     General: Skin is warm and dry.  Neurological:     Mental Status: She is alert and oriented to person, place, and time.        CMP Latest Ref Rng & Units 05/12/2019  Glucose 70 - 99 mg/dL 266(H)  BUN 8 - 23 mg/dL 21  Creatinine 0.44 - 1.00 mg/dL 0.65  Sodium 135 - 145 mmol/L 138  Potassium 3.5 - 5.1 mmol/L 4.4  Chloride 98 -  111 mmol/L 104  CO2 22 - 32 mmol/L 25  Calcium 8.9 - 10.3 mg/dL 7.8(L)  Total Protein 6.5 - 8.1 g/dL -  Total Bilirubin 0.3 - 1.2 mg/dL -  Alkaline Phos 38 - 126 U/L -  AST 15 - 41 U/L -  ALT 0 - 44 U/L -   CBC Latest Ref Rng & Units 05/12/2019  WBC 4.0 - 10.5 K/uL 11.1(H)  Hemoglobin 12.0 - 15.0 g/dL 13.1  Hematocrit 36.0 - 46.0 % 41.0  Platelets 150 - 400 K/uL 247    @IMAGES @  CT Head W Wo Contrast  Result Date: 05/08/2019 CLINICAL DATA:  Abnormal PET-CT with a left lung mass, bone metastases, and hypodensity in the left cerebral hemisphere. EXAM: CT HEAD WITHOUT AND WITH CONTRAST TECHNIQUE: Contiguous axial images were obtained from the base of the skull through the vertex without and with intravenous contrast CONTRAST:  63mL OMNIPAQUE IOHEXOL 300 MG/ML  SOLN COMPARISON:  PET-CT 04/27/2019 FINDINGS: Brain: There is no evidence of acute infarct, intracranial hemorrhage, midline shift, or extra-axial fluid collection. A chronic left parietal infarct is noted. Bilateral cerebral white matter hypodensities are nonspecific but compatible with mild chronic small vessel ischemic disease. Mild cerebral atrophy is not greater than expected for age. There is an 11 x 8 mm enhancing mass anteriorly in the left frontal lobe with mild edema. Additional enhancing lesions are questioned in the right frontal lobe measuring 3 mm (series 4, image 16 and series 6, image 13) and at the right temporoparietal junction measuring 3 mm (series 6, image 10). Vascular: Calcified atherosclerosis at the skull base. Patent dural venous sinuses. Skull: 5 mm lucent lesion in the left frontal  skull (series 3, image 58). Sinuses/Orbits: Mild mucosal thickening or small volume fluid in the ethmoid and sphenoid sinuses bilaterally. Clear mastoid air cells. Unremarkable visualized orbits. Other: None. IMPRESSION: 1. 11 mm left frontal lobe mass with mild edema consistent with metastatic disease. 2. Two questionable additional lesions measuring 3 mm in the right frontal lobe and at the right temporoparietal junction. 3. Indeterminate 5 mm left frontal skull lesion. 4. 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 Electronically Signed   By: Logan Bores M.D.   On: 05/08/2019 08:50   NM Bone Scan Whole Body  Result Date: 04/15/2019 CLINICAL DATA:  Pathologic fracture LEFT humerus, follow-up EXAM: NUCLEAR MEDICINE WHOLE BODY BONE SCAN TECHNIQUE: Whole body anterior and posterior images were obtained approximately 3 hours after intravenous injection of radiopharmaceutical. RADIOPHARMACEUTICALS:  22.005 mCi Technetium-42m MDP IV COMPARISON:  None Radiographic correlation: LEFT humeral radiographs 02/14/2019 FINDINGS: Multiple sites of abnormal osseous tracer uptake are identified consistent with osseous metastatic disease. These include proximal humeri, tip of LEFT scapula, BILATERAL posterior ribs, thoracic and lumbar spine, pelvis, and multiple sites in the proximal and distal LEFT femur. Intense uptake at mid LEFT humeral diaphysis corresponding to known pathologic fracture. Degenerative type uptake at RIGHT knee with note of a LEFT knee prosthesis. Photopenic region at the upper anterior LEFT chest corresponding to pacemaker generator. Expected urinary tract and soft tissue distribution of tracer. IMPRESSION: Multiple sites of abnormal tracer uptake consistent with osseous metastatic disease. These include sites within the humeri bilaterally and within the LEFT femur; radiographs of the LEFT femur and RIGHT humerus recommended to exclude metastatic lesions at risk for  pathologic fracture. Electronically Signed   By: Lavonia Dana M.D.   On: 04/15/2019 08:34   NM PET Image Initial (PI) Whole Body  Result  Date: 04/27/2019 CLINICAL DATA:  Initial treatment strategy for pathologic fracture of left humerus and other sites in the skeleton, assessment for primary. EXAM: NUCLEAR MEDICINE PET WHOLE BODY TECHNIQUE: 7.7 mCi F-18 FDG was injected intravenously. Full-ring PET imaging was performed from the skull base to thigh after the radiotracer. CT data was obtained and used for attenuation correction and anatomic localization. Fasting blood glucose: 164 mg/dl COMPARISON:  Serve multiple whole-body bone scan from 04/14/2019 FINDINGS: Mediastinal blood pool activity: SUV max 3.1 HEAD/NECK: Reduced activity and some local hypodensity in the left parietal lobe could be from an old stroke or underlying lesion. There is also some asymmetric hypodensity in the left frontal white matter which could be from asymmetric chronic ischemic microvascular white matter disease or vasogenic edema. If the patient's pacer/AICD prevents MRI, dedicated CT of the brain with and without contrast would be recommended. If the device is MRI compatible, then MRI brain with and without contrast would be preferred. Incidental CT findings: Bilateral common carotid atherosclerotic calcification. Acute right and chronic left sphenoid sinusitis with mild chronic ethmoid sinusitis. CHEST: Left upper lobe retro hilar mass measures approximately 4.0 by 3.0 cm on image 112/3, maximum SUV 9.1. This probably invades across the fissure into the superior segment left lower lobe. A variety of nodules are present bilaterally which are not appreciably hypermetabolic but are below sensitive PET-CT size thresholds. This includes a 7 mm ground-glass density nodule in the apical segment right upper lobe on image 96/3; a 4 mm right basilar nodule on image 140/3; a 4 mm lingular nodule on image 128/3; a 0.7 by 0.6 cm left lower lobe  nodule on image 135/3; and a 0.4 cm left lower lobe nodule on image 140/3. Incidental CT findings: Coronary, aortic arch, and branch vessel atherosclerotic vascular disease. Pacer/AICD noted. Mild cardiomegaly. Subcoracoid bursitis on the right. ABDOMEN/PELVIS: Focal anal activity, maximum SUV 7.3, probably physiologic but technically nonspecific. Incidental CT findings: Aortoiliac atherosclerotic vascular disease. Probable 3 mm right mid kidney nonobstructive renal calculus. Sigmoid diverticulosis. Uterine fibroids. SKELETON: Scattered skeletal metastatic disease including the pathologic fracture in the left mid humerus, with maximum SUV 7.7. Metastatic lesions are identified in the thoracic vertebra (for example a T1 right eccentric lesion with maximum SUV 5.9) in the ribs (for example a medial right sixth rib lesion maximum SUV 5.1, lumbar spine, left upper acetabulum (a lytic lesion measuring 3.2 cm in diameter with maximum SUV 7.1) right acetabulum, left proximal femur (maximum SUV 6.0) distally in the left femur; likely in the left medial femoral condyle posteriorly where there is maximum SUV of 6.9; and in the right proximal humeral metaphysis (maximum SUV 6.0). Incidental CT findings: Incidental failure of fusion of the posterior arch of C1. Chronic bilateral pars defects at L5 with grade 2 anterolisthesis of L5 on S1. EXTREMITIES: No significant abnormal hypermetabolic activity in this region. Incidental CT findings: Incidental left iliopsoas lipoma. Left total knee prosthesis. Osteoarthritis of the right knee. SFA and popliteal artery atherosclerotic calcifications. IMPRESSION: 1. 4 cm left retro hilar mass primarily in the left upper lobe but probably invading across the major fissure into the superior segment left lower lobe, maximum SUV 9.1, compatible with malignancy and likely reflecting lung primary for the metastatic disease. 2. Scattered osseous metastatic lesions including the left mid humeral  pathologic fracture, as detailed above. One notable lesion is the lytic left acetabular metastatic lesion measuring 3.2 cm in diameter with maximum SUV 7.1. 3. Two areas of hypodensity in the left cerebral  hemisphere could be residua from prior stroke or chronic microvascular white matter disease, but intracranial metastatic disease is not totally excluded. MRI brain with and without contrast would be preferred but the patient has a pacer/AICD device; if this device is not MRI compatible, then consider CT of the head with and without contrast for further characterization. 4. Focal activity along the anus is probably physiologic but technically nonspecific. 5. Other imaging findings of potential clinical significance: Acute and chronic paranasal sinusitis. Aortic Atherosclerosis (ICD10-I70.0). Coronary atherosclerosis. Mild cardiomegaly. Right subcoracoid bursitis. Probable nonobstructive right nephrolithiasis. Sigmoid diverticulosis. Uterine fibroids. Chronic pars defects at L5 with grade 2 anterolisthesis. Electronically Signed   By: Van Clines M.D.   On: 04/27/2019 13:44   DG Humerus Right  Result Date: 04/20/2019 CLINICAL DATA:  Osseous metastatic disease. EXAM: RIGHT HUMERUS - 2+ VIEW COMPARISON:  Bone scan dated April 14, 2019. FINDINGS: 1.7 cm lucent lesion in the proximal humeral diaphysis. No additional focal bone lesion. No acute fracture or dislocation. Mild degenerative changes of the shoulder and elbow. IMPRESSION: 1. 1.7 cm lucent lesion in the proximal humeral diaphysis corresponding to the area of abnormal uptake on recent bone scan, consistent with osseous metastatic disease. Electronically Signed   By: Titus Dubin M.D.   On: 04/20/2019 15:54   DG HIP UNILAT WITH PELVIS 2-3 VIEWS LEFT  Result Date: 04/20/2019 CLINICAL DATA:  Osseous metastatic disease. EXAM: LEFT FEMUR 2 VIEWS; DG HIP (WITH OR WITHOUT PELVIS) 2-3V LEFT COMPARISON:  Bone scan dated April 14, 2019. FINDINGS:  Ill-defined 2.9 cm lucent lesion in the left superior acetabulum. 1.5 cm lucent lesion in the distal left femoral metadiaphysis. No discrete lesion identified in the left proximal femur. Mild right hip osteoarthritis. Prior left total knee arthroplasty. Calcified fibroid in the pelvis. Vascular calcifications. IMPRESSION: 1. Lucent lesions in the left superior acetabulum and distal left femoral metadiaphysis corresponding to areas of abnormal uptake on recent bone scan, consistent with osseous metastatic disease. 2. No discrete lesion identified in the left proximal femur at the site of abnormal uptake seen on recent both scan. Electronically Signed   By: Titus Dubin M.D.   On: 04/20/2019 15:59   DG FEMUR MIN 2 VIEWS LEFT  Result Date: 04/20/2019 CLINICAL DATA:  Osseous metastatic disease. EXAM: LEFT FEMUR 2 VIEWS; DG HIP (WITH OR WITHOUT PELVIS) 2-3V LEFT COMPARISON:  Bone scan dated April 14, 2019. FINDINGS: Ill-defined 2.9 cm lucent lesion in the left superior acetabulum. 1.5 cm lucent lesion in the distal left femoral metadiaphysis. No discrete lesion identified in the left proximal femur. Mild right hip osteoarthritis. Prior left total knee arthroplasty. Calcified fibroid in the pelvis. Vascular calcifications. IMPRESSION: 1. Lucent lesions in the left superior acetabulum and distal left femoral metadiaphysis corresponding to areas of abnormal uptake on recent bone scan, consistent with osseous metastatic disease. 2. No discrete lesion identified in the left proximal femur at the site of abnormal uptake seen on recent both scan. Electronically Signed   By: Titus Dubin M.D.   On: 04/20/2019 15:59    Assessment and plan- Patient is a 79 y.o. female with newly diagnosed lung mass, bone and brain metastases admitted for atrial fibrillation prior to bronchoscopy  Patient has baseline AICD placement and with her recent episode of atrial fibrillation there are concerns about going through  bronchoscopy safely for tissue diagnosis.  She will therefore be getting IR guided bone biopsy tomorrow.  Hoping that bone biopsy gives [place] answers as it is a less  desirable specimen often giving to send peripheral blood for NGS testing since the tumor sample is inadequate for NGS testing.  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  She is willing to proceed with systemic therapy which will likely start in 2 weeks time.  We will plan for port placement as an inpatient.  Baseline labs are unremarkable except for a low TSH and mildly elevated free T4.  She may require dose adjustment of her levothyroxine.  I will follow-up with her as an outpatient and discuss pathology results and further management      Visit Diagnosis 1. Metastatic cancer to bone [facility])     Dr. Randa Evens, MD, MPH [facility] at [facility] [phone] 05/12/2019

## 2019-05-12 NOTE — Progress Notes (Signed)
*  PRELIMINARY RESULTS* Echocardiogram 2D Echocardiogram has been performed.  Megan Shelton 05/12/2019, 10:56 AM

## 2019-05-12 NOTE — Telephone Encounter (Signed)
Unable to speak  with patient to remind of missed remote transmission 

## 2019-05-12 NOTE — Progress Notes (Signed)
Progress Note  Patient Name: Megan Shelton Date of Encounter: 05/12/2019  Primary Cardiologist: Virl Axe, MD   Subjective   Reportedly slept well but very anxious this AM given the telemetry alarm keeps beeping overhead. States that she is eager to get out of Atrial Fibrillation. We discussed restoring NSR with cardioversion this AM with patient agreeable to proceed with this procedure and consented.  She continues to be asymptomatic and without feelings of racing HR. No chest pain. Her main complaint is of anxiety.    Inpatient Medications    Scheduled Meds: . atorvastatin  40 mg Oral Daily  . butamben-tetracaine-benzocaine  1 spray Topical Once  . Chlorhexidine Gluconate Cloth  6 each Topical Daily  . dexamethasone  4 mg Oral BID WC  . influenza vaccine adjuvanted  0.5 mL Intramuscular Tomorrow-1000  . insulin aspart  0-5 Units Subcutaneous QHS  . insulin aspart  0-9 Units Subcutaneous TID WC  . levothyroxine  112 mcg Oral Q0600  . lisinopril  10 mg Oral Daily  . metoprolol tartrate  25 mg Oral BID  . pyridOXINE  100 mg Oral Daily  . vitamin B-12  100 mcg Oral Daily   Continuous Infusions: . sodium chloride 75 mL/hr at 05/11/19 2036  . diltiazem (CARDIZEM) infusion 15 mg/hr (05/12/19 0415)  . heparin 850 Units/hr (05/11/19 2017)   PRN Meds: acetaminophen **OR** acetaminophen, clonazePAM, guaiFENesin-dextromethorphan, hydrALAZINE, hydrocortisone, ondansetron **OR** ondansetron (ZOFRAN) IV, oxyCODONE   Vital Signs    Vitals:   05/12/19 0400 05/12/19 0500 05/12/19 0600 05/12/19 0700  BP: 112/85 (!) 104/54 114/80 120/86  Pulse: (!) 30 (!) 56 65 (!) 27  Resp: 17 17 20 15   Temp: 97.9 F (36.6 C)     TempSrc: Oral     SpO2: 95% 93% 94% 93%  Weight:      Height:        Intake/Output Summary (Last 24 hours) at 05/12/2019 0806 Last data filed at 05/12/2019 0600 Gross per 24 hour  Intake --  Output 400 ml  Net -400 ml   Last 3 Weights 05/11/2019 05/07/2019 05/05/2019   Weight (lbs) 157 lb 160 lb 161 lb 8 oz  Weight (kg) 71.215 kg 72.576 kg 73.256 kg      Telemetry    Atrial fibrillation with rapid ventricular response and  PVCs, rates 100s-160s- Personally Reviewed  ECG    Atrial fibrillation with rapid ventricular response and RBBB, 116bpm - Personally Reviewed  Physical Exam   GEN: No acute distress.  Lying in bed. Neck: No JVD Cardiac: IRIR, no murmurs, rubs, or gallops.  Respiratory: Clear to auscultation bilaterally. GI: Soft, nontender, non-distended  MS: No edema; No deformity. Neuro:  Nonfocal  Psych: Normal affect   Labs    High Sensitivity Troponin:   Recent Labs  Lab 05/11/19 1418 05/11/19 1818 05/11/19 1932 05/11/19 2305  TROPONINIHS 40* 38* 41* 37*      Cardiac EnzymesNo results for input(s): TROPONINI in the last 168 hours. No results for input(s): TROPIPOC in the last 168 hours.   Chemistry Recent Labs  Lab 05/11/19 1418 05/12/19 0247  NA 138 138  K 4.2 4.4  CL 104 104  CO2 24 25  GLUCOSE 267* 266*  BUN 25* 21  CREATININE 0.55 0.65  CALCIUM 8.3* 7.8*  ALBUMIN 3.6  --   GFRNONAA >60 >60  GFRAA >60 >60  ANIONGAP 10 9     Hematology Recent Labs  Lab 05/08/19 0905 05/11/19 1418 05/12/19 0247  WBC  9.5 14.2* 11.1*  RBC 5.30* 5.04 4.76  HGB 14.4 14.0 13.1  HCT 44.5 42.6 41.0  MCV 84.0 84.5 86.1  MCH 27.2 27.8 27.5  MCHC 32.4 32.9 32.0  RDW 14.8 15.6* 15.8*  PLT 232 287 247    BNPNo results for input(s): BNP, PROBNP in the last 168 hours.   DDimer No results for input(s): DDIMER in the last 168 hours.   Radiology    No results found.  Cardiac Studies   Cath/PCI:  LHC/PCI (04/2006, [place]): Severe disease involving the proximal LAD (question CTO) and 90% OM1 stenosis.  Successful PCI to OM1.  EP Procedures and Devices:  ICD implantation  Non-Invasive Evaluation(s):  Outside echocardiogram (10/08/2016): Normal LV size with mild LVH.  LVEF greater than 55% with grade 1  diastolic dysfunction.  Normal RV size and function.  Mild left atrial enlargement.  Aortic sclerosis.  Mild to moderate tricuspid regurgitation.  Patient Profile     79 y.o. female with a history of CAD, s/p remote anterior MI, ICM with normalization of LVEF s/p ICD, hypertension, HLD, DM2, hypothyroidism, and seen today for new onset atrial fibrillation with rapid ventricular response prior to anesthesia for upcoming bronchoscopy procedure on 05/11/2019.  Assessment & Plan    New onset atrial fibrillation with rapid ventricular response --Remains in atrial fibrillation with RVR this AM and rates up into the 160s.  Given no improvement in heart rate with IV metoprolol, esmolol, and Cardizem, plan for DCCV. Within 48 hours since entering atrial fibrillation with RVR; therefore, can complete DCCV without TEE. Will schedule her for cardioversion this morning.   --Once back in NSR, discontinue IV diltiazem given rates were in the 60s when in NSR before onset of Atrial fibrillation and to prevent ongoing bradycardia. PTA medications included metoprolol 25mg  BID. Will hold for now. Restart once rate indicates.   --Given upcoming bronchoscopy and plan for insertion of port-a-cath, continue IV heparin for anticoagulation and until perform all invasive procedures. CHA2DS2VASc score of at least  7 (CHF, HTN, agex2, DM2, vascular, female) with need for long term anticoagulation. Will transition to [facility] with Eliquis or Xarelto and to be determined prior to discharge.  --Continue daily CBC and BMP.  Current renal function, hemoglobin, and electrolytes (K, Mg) stable. TSH low with recommendations as below.  --Order echocardiogram to reassess EF and assess for acute structural abnormalities once back in NSR and rate controlled. Previous echo as above shows normal LVSF.   --Follow-up in the office after discharge.  CAD s/p ICD --No CP. History of CAD s/p remote anterior MI as above.  Complete catheterization report  from 2008 not available.  Suspicion that pLAD disease likely represents CTO.  ICM with normalization of LVEF s/p ICD.  Most recent EF as above with pending echocardiogram.  HS Tn minimally elevated and flat trending at 40  38  41  37. As above, will discontinue IV of diltiazem following cardioversion.  Restart PTA metoprolol once needed / if further rate control needed in NSR.  Continue lisinopril.  Continue statin for aggressive risk factor modification.  Daily BMET.  Ischemic cardiomyopathy --Not volume overloaded on exam.  No SOB reported. Continue lisinopril.  Restart beta-blocker once rate indicates.  PTA as needed Lasix currently on hold given euvolemic.  Daily BMET with current renal function stable. Updating echo as above. Previous echo with nl EF. Continue to follow with EP as an outpatient for her ICD.  HTN --Continue to monitor BP. Currently stable  BP in the 120s while in Afib.  Further BP control needed once in NSR, consider escalation of lisinopril as renal function allows.  HLD --Continue statin.  DM2 --Glycemic control recommended.  Hemoglobin A1c 7.1 and elevated.  SSI. Per IM.  Abnormal TSH --Continue PTA Synthroid.  TSH low 0.183.  Ordered free T4.  Per IM/endocrinology.     For questions or updates, please contact [place] Please consult [email or web address] for contact info under        Signed, Arvil Chaco, PA-C  05/12/2019, 8:06 AM

## 2019-05-12 NOTE — Anesthesia Preprocedure Evaluation (Signed)
Anesthesia Evaluation  Patient identified by MRN, date of birth, ID band Patient awake    Reviewed: Allergy & Precautions, H&P , NPO status , Patient's Chart, lab work & pertinent test results  History of Anesthesia Complications Negative for: history of anesthetic complications  Airway Mallampati: II       Dental  (+) Dental Advidsory Given   Pulmonary shortness of breath, neg recent URI, Patient abstained from smoking., former smoker,  Metastatic lung cancer          Cardiovascular hypertension, (-) angina+ CAD, + Past MI and +CHF  + dysrhythmias Atrial Fibrillation + Cardiac Defibrillator   MI in 2008 s/p stent, ischemic cardiopathy and ICD placement presumed for primary prevention.  Per most recent Cardiology note, she has had interval return of LV function.  No echo available for review.   Neuro/Psych PSYCHIATRIC DISORDERS Anxiety negative neurological ROS     GI/Hepatic negative GI ROS, Neg liver ROS,   Endo/Other  diabetes, Type 2, Oral Hypoglycemic Agents  Renal/GU      Musculoskeletal   Abdominal   Peds  Hematology negative hematology ROS (+)   Anesthesia Other Findings Past Medical History: No date: AICD (automatic cardioverter/defibrillator) present No date: Anxiety No date: CHF (congestive heart failure) (HCC) No date: Diabetes mellitus with neuropathy (HCC) No date: Dyspnea No date: Hypertension No date: Hypertensive heart disease No date: Implantable cardioverter-defibrillator (ICD)-Medtronic No date: Myocardial infarction, anterior wall (HCC) No date: Obesity, Class II, BMI 35-39.9  Past Surgical History: No date: CARPAL TUNNEL RELEASE No date: cataract surgery No date: EYE SURGERY No date: FRACTURE SURGERY; Left     Comment:  ARM No date: JOINT REPLACEMENT; Left     Comment:  tkr     Reproductive/Obstetrics negative OB ROS                              Anesthesia Physical  Anesthesia Plan  ASA: III  Anesthesia Plan: General   Post-op Pain Management:    Induction: Intravenous  PONV Risk Score and Plan: TIVA and Propofol infusion  Airway Management Planned: Natural Airway and Nasal Cannula  Additional Equipment:   Intra-op Plan:   Post-operative Plan:   Informed Consent: I have reviewed the patients History and Physical, chart, labs and discussed the procedure including the risks, benefits and alternatives for the proposed anesthesia with the patient or authorized representative who has indicated his/her understanding and acceptance.     Dental Advisory Given  Plan Discussed with: Anesthesiologist, CRNA and Surgeon  Anesthesia Plan Comments:         Anesthesia Quick Evaluation

## 2019-05-12 NOTE — Care Plan (Signed)
She required cardioversion this morning.  Given that she required cardioversion I believe it would be prudent to withhold general anesthesia for now to prevent deterioration of her rhythm again.  I have discussed with Dr. Vernard Gambles, IR with regards to bone biopsy.  Dr. Vernard Gambles feels that the acetabular bone with metastatic disease is a good target for biopsy.  This has been planned for tomorrow around 2 PM.  She will need heparin held for 4 hours prior to the procedure.  This was discussed with her bedside nurse Megan.  I have updated Dr. Janese Banks, Dr. Baruch Gouty and Dr. Lucky Cowboy.  Her Port-A-Cath is still set for Thursday under conscious sedation.  Discussed with Dr. Arbutus Ped from the hospitalist service.  Renold Don, MD [place]

## 2019-05-12 NOTE — Addendum Note (Signed)
Addendum  created 05/12/19 0941 by [name], CRNA   Clinical Note Signed

## 2019-05-12 NOTE — Transfer of Care (Signed)
Immediate Anesthesia Transfer of Care Note  Patient: Megan Shelton  Procedure(s) Performed: CARDIOVERSION (N/A )  Patient Location: ICU  Anesthesia Type:General  Level of Consciousness: awake  Airway & Oxygen Therapy: Patient Spontanous Breathing and Patient connected to nasal cannula oxygen  Post-op Assessment: Report given to RN and Post -op Vital signs reviewed and stable  Post vital signs: Reviewed  Last Vitals:  Vitals Value Taken Time  BP 97/54 05/12/19 0836  Temp    Pulse 56 05/12/19 0836  Resp 22 05/12/19 0836  SpO2 95 % 05/12/19 0836    Last Pain:  Vitals:   05/12/19 0712  TempSrc:   PainSc: Asleep         Complications: No apparent anesthesia complications

## 2019-05-12 NOTE — Progress Notes (Signed)
[place] for heparin Indication: atrial fibrillation  Allergies  Allergen Reactions  . Latex Rash  . Nickel Rash   Patient Measurements: Height: 5' (152.4 cm) Weight: 157 lb (71.2 kg) IBW/kg (Calculated) : 45.5 Heparin Dosing Weight: 61 kg  Vital Signs: Temp: 97.6 F (36.4 C) (02/02 2000) Temp Source: Oral (02/02 1400) BP: 155/82 (02/02 2200) Pulse Rate: 71 (02/02 2200)  Labs: Recent Labs    05/11/19 1418 05/11/19 1418 05/11/19 1818 05/11/19 1932 05/11/19 2305 05/12/19 0247 05/12/19 1134 05/12/19 2152  HGB 14.0  --   --   --   --  13.1  --   --   HCT 42.6  --   --   --   --  41.0  --   --   PLT 287  --   --   --   --  247  --   --   APTT  --   --  27  --   --   --   --   --   LABPROT  --   --  14.2  --   --   --   --  14.2  INR  --   --  1.1  --   --   --   --  1.1  HEPARINUNFRC  --   --   --   --   --  0.44 0.29* 0.40  CREATININE 0.55  --   --   --   --  0.65  --   --   TROPONINIHS 40*   < > 38* 41* 37*  --   --   --    < > = values in this interval not displayed.    Estimated Creatinine Clearance: 51.9 mL/min (by C-G formula based on SCr of 0.65 mg/dL).   Medical History: Past Medical History:  Diagnosis Date  . AICD (automatic cardioverter/defibrillator) present   . Anxiety   . CHF (congestive heart failure) ([place])   . Diabetes mellitus with neuropathy ([place])   . Dyspnea   . Hypertension   . Hypertensive heart disease   . Implantable cardioverter-defibrillator (ICD)-Medtronic   . Myocardial infarction, anterior wall ([place])   . Obesity, Class II, BMI 35-39.9     Assessment: 79 year old female with new onset afib w/ RVR. Cardiology consulted, recommend anticoagulation with heparin short term until invasive procedures performed. CHA2DS2VASc ~ 5 and will likely transition to PO anticoagulation long-term once all invasive procedures performed. Pharmacy consulted for heparin drip at this time.  2/2 0247 HL 0.44,  therapeutic x 1.  CBC stable.  2/2 1134 HL 0.29, bolus 900 units, increase rate to 950 units/hr 2/2 2152 HL 0.40, therapeutic x 1  Goal of Therapy:  Heparin level 0.3-0.7 units/ml Monitor platelets by anticoagulation protocol: Yes   Plan:  Will continue Heparin infusion at 950 units/hr. Check HL in 8 hours to confirm. CBC daily while on heparin drip.  Hart Robinsons, PharmD Clinical Pharmacist   05/12/2019 10:23 PM

## 2019-05-12 NOTE — Progress Notes (Signed)
PROGRESS NOTE    Megan Shelton  YHC:[id number] DOB: 31-Jul-1940 DOA: 05/11/2019  PCP: Mar Daring, PA-C    LOS - 1   Brief Narrative:  79 y.o. female with medical history significant of recently diagnosed stage IV lung cancer, hypertension, hyperlipidemia, diabetes mellitus, hypothyroidism, anxiety, CAD, ICD placement, CHF, who presented with atrial fibrillation with RVR, onset just before starting EBUS for biopsy on 2/1.  A-fib uncontrolled on cardizem gtt.  Was cardioverted successfully this AM.  Bone biopsy with IR planned for tomorrow.  Subjective 2/2: Patient reports feeling when seen this morning after cardioversion.  Denies pain, SOB, F/C, N/V/D or other complaints.  On monitor is in NSR at 65 bpm.  Assessment & Plan:   Principal Problem:   Atrial fibrillation with RVR (HCC) Active Problems:   Diabetes mellitus with neuropathy (HCC)   Essential hypertension   Chronic HFrEF (heart failure with reduced ejection fraction) (HCC)   Hyperlipidemia LDL goal <70   Bone metastases (HCC)   Lung cancer-metastasized   Atrial fibrillation with RVR ([place]): This is a new onset of atrial fibrillation with RVR. CHA2DS2-VASc Score is 7, needs oral anticoagulation. Cardioverted morning of 2/2. --cardiology consulted, Dr. Fletcher Anon --continue metoprolol -Trend troponin --f/u Echo --TSH low, T4 pending  Lung cancer-metastasized and bone metastases [facility]):  --tissue diagnosis pending --Dr. Patsey Berthold will forego EBUS to avoid sedation given a-fib --Bone biopsy with IR tomorrow --follow up with oncology  Diabetes mellitus with neuropathy [facility]): Most recent A1c 6.4, well controled. Patient is taking Metformin, glipizide, Januvia at home -SSI  Essential hypertension: -prn Hydralazine -Continue home medications: Lisinopril, metoprolol  Chronic HFrEF (heart failure with reduced ejection fraction) ([place]): No leg edema JVD.  No shortness of breath.  CHF is compensated. -Continue home  Lasix  Hyperlipidemia LDL goal <70 -Lipitor  DVT prophylaxis: on heparin gtt   Code Status: DNR  Family Communication: none at bedside Disposition Plan:  Expect can be discharged after bone biopsy if no other issues, pending clearance by cardiology.   Consultants:   Cardiology  Procedures:   Cardioversion 05/12/19  Antimicrobials:   None   Objective: Vitals:   05/12/19 0400 05/12/19 0500 05/12/19 0600 05/12/19 0700  BP: 112/85 (!) 104/54 114/80 120/86  Pulse: (!) 30 (!) 56 65 (!) 27  Resp: 17 17 20 15   Temp: 97.9 F (36.6 C)     TempSrc: Oral     SpO2: 95% 93% 94% 93%  Weight:      Height:        Intake/Output Summary (Last 24 hours) at 05/12/2019 0825 Last data filed at 05/12/2019 0600 Gross per 24 hour  Intake --  Output 400 ml  Net -400 ml   Filed Weights   05/11/19 1156  Weight: 71.2 kg    Examination:  General exam: awake, alert, no acute distress HEENT: moist mucus membranes, hearing grossly normal  Respiratory system: clear to auscultation bilaterally, no wheezes, rales or rhonchi, normal respiratory effort. Cardiovascular system: normal S1/S2, RRR, no pedal edema. Gastrointestinal system: soft, non-tender, non-distended abdomen, no organomegaly or masses felt, normal bowel sounds. Central nervous system: alert and oriented x3. no gross focal neurologic deficits, normal speech Extremities: moves all, no edema, normal tone Skin: dry, intact, normal temperature Psychiatry: normal mood, congruent affect, judgement and insight appear normal    Data Reviewed: I have personally reviewed following labs and imaging studies  CBC: Recent Labs  Lab 05/08/19 0905 05/11/19 1418 05/12/19 0247  WBC 9.5 14.2* 11.1*  NEUTROABS  --  12.2*  --   HGB 14.4 14.0 13.1  HCT 44.5 42.6 41.0  MCV 84.0 84.5 86.1  PLT 232 287 812   Basic Metabolic Panel: Recent Labs  Lab 05/11/19 1418 05/12/19 0247  NA 138 138  K 4.2 4.4  CL 104 104  CO2 24 25  GLUCOSE  267* 266*  BUN 25* 21  CREATININE 0.55 0.65  CALCIUM 8.3* 7.8*  MG 2.0  --   PHOS 4.2  --    GFR: Estimated Creatinine Clearance: 51.9 mL/min (by C-G formula based on SCr of 0.65 mg/dL). Liver Function Tests: Recent Labs  Lab 05/11/19 1418  ALBUMIN 3.6   No results for input(s): LIPASE, AMYLASE in the last 168 hours. No results for input(s): AMMONIA in the last 168 hours. Coagulation Profile: Recent Labs  Lab 05/11/19 1818  INR 1.1   Cardiac Enzymes: No results for input(s): CKTOTAL, CKMB, CKMBINDEX, TROPONINI in the last 168 hours. BNP (last 3 results) No results for input(s): PROBNP in the last 8760 hours. HbA1C: Recent Labs    05/11/19 1818  HGBA1C 7.1*   CBG: Recent Labs  Lab 05/11/19 1200 05/11/19 1430 05/11/19 1748 05/11/19 2135 05/12/19 0723  GLUCAP 266* 255* 294* 234* 209*   Lipid Profile: No results for input(s): CHOL, HDL, LDLCALC, TRIG, CHOLHDL, LDLDIRECT in the last 72 hours. Thyroid Function Tests: Recent Labs    05/11/19 1418  TSH 0.183*   Anemia Panel: No results for input(s): VITAMINB12, FOLATE, FERRITIN, TIBC, IRON, RETICCTPCT in the last 72 hours. Sepsis Labs: No results for input(s): PROCALCITON, LATICACIDVEN in the last 168 hours.  Recent Results (from the past 240 hour(s))  SARS CORONAVIRUS 2 (TAT 6-24 HRS) Nasopharyngeal Nasopharyngeal Swab     Status: None   Collection Time: 05/08/19  9:32 AM   Specimen: Nasopharyngeal Swab  Result Value Ref Range Status   SARS Coronavirus 2 NEGATIVE NEGATIVE Final    Comment: (NOTE) SARS-CoV-2 target nucleic acids are NOT DETECTED. The SARS-CoV-2 RNA is generally detectable in upper and lower respiratory specimens during the acute phase of infection. Negative results do not preclude SARS-CoV-2 infection, do not rule out co-infections with other pathogens, and should not be used as the sole basis for treatment or other patient management decisions. Negative results must be combined with  clinical observations, patient history, and epidemiological information. The expected result is Negative. Fact Sheet for Patients: SugarRoll.be Fact Sheet for Healthcare Providers: https://www.woods-mathews.com/ This test is not yet approved or cleared by the [place] FDA and  has been authorized for detection and/or diagnosis of SARS-CoV-2 by FDA under an Emergency Use Authorization (EUA). This EUA will remain  in effect (meaning this test can be used) for the duration of the COVID-19 declaration under Section 56 4(b)(1) of the Act, 21 U.S.C. section 360bbb-3(b)(1), unless the authorization is terminated or revoked sooner. Performed at [facility], [place] [address] [place], [place] [address]   MRSA PCR Screening     Status: None   Collection Time: 05/11/19 11:05 PM   Specimen: Nasopharyngeal  Result Value Ref Range Status   MRSA by PCR NEGATIVE NEGATIVE Final    Comment:        The GeneXpert MRSA Assay (FDA approved for NASAL specimens only), is one component of a comprehensive MRSA colonization surveillance program. It is not intended to diagnose MRSA infection nor to guide or monitor treatment for MRSA infections. Performed at [facility], [address], [place], [place] [address]  Radiology Studies: No results found.      Scheduled Meds: . atorvastatin  40 mg Oral Daily  . butamben-tetracaine-benzocaine  1 spray Topical Once  . Chlorhexidine Gluconate Cloth  6 each Topical Daily  . dexamethasone  4 mg Oral BID WC  . influenza vaccine adjuvanted  0.5 mL Intramuscular Tomorrow-1000  . insulin aspart  0-5 Units Subcutaneous QHS  . insulin aspart  0-9 Units Subcutaneous TID WC  . levothyroxine  112 mcg Oral Q0600  . lisinopril  10 mg Oral Daily  . metoprolol tartrate  25 mg Oral BID  . pyridOXINE  100 mg Oral Daily  . vitamin B-12  100 mcg Oral Daily   Continuous Infusions: . sodium  chloride 75 mL/hr at 05/11/19 2036  . diltiazem (CARDIZEM) infusion 15 mg/hr (05/12/19 0415)  . heparin 850 Units/hr (05/11/19 2017)     LOS: 1 day    Time spent: 30 minutes    Ezekiel Slocumb, DO [facility]   If 7PM-7AM, please contact night-coverage [email or web address] 05/12/2019, 8:25 AM

## 2019-05-12 NOTE — Progress Notes (Signed)
Pt cardioverted by Dr. Saunders Revel at this time. Pt shocked back into NSR in the 50-60 range. Anesthesia is at bedside also. Pt tolerated well.

## 2019-05-12 NOTE — Progress Notes (Signed)
[place] for heparin Indication: atrial fibrillation  Allergies  Allergen Reactions  . Latex Rash  . Nickel Rash    Patient Measurements: Height: 5' (152.4 cm) Weight: 157 lb (71.2 kg) IBW/kg (Calculated) : 45.5 Heparin Dosing Weight: 61 kg  Vital Signs: Temp: 98 F (36.7 C) (02/02 0000) Temp Source: Oral (02/02 0000) BP: 103/71 (02/02 0000) Pulse Rate: 63 (02/02 0000)  Labs: Recent Labs    05/11/19 1418 05/11/19 1418 05/11/19 1818 05/11/19 1932 05/11/19 2305 05/12/19 0247  HGB 14.0  --   --   --   --  13.1  HCT 42.6  --   --   --   --  41.0  PLT 287  --   --   --   --  247  APTT  --   --  27  --   --   --   LABPROT  --   --  14.2  --   --   --   INR  --   --  1.1  --   --   --   HEPARINUNFRC  --   --   --   --   --  0.44  CREATININE 0.55  --   --   --   --  0.65  TROPONINIHS 40*   < > 38* 41* 37*  --    < > = values in this interval not displayed.    Estimated Creatinine Clearance: 51.9 mL/min (by C-G formula based on SCr of 0.65 mg/dL).   Medical History: Past Medical History:  Diagnosis Date  . AICD (automatic cardioverter/defibrillator) present   . Anxiety   . CHF (congestive heart failure) ([place])   . Diabetes mellitus with neuropathy ([place])   . Dyspnea   . Hypertension   . Hypertensive heart disease   . Implantable cardioverter-defibrillator (ICD)-[employer]tronic   . Myocardial infarction, anterior wall ([place])   . Obesity, Class II, BMI 35-39.9     Assessment: 79 year old female with new onset afib w/ RVR. Cardiology consulted, recommend anticoagulation with heparin short term until invasive procedures performed. CHA2DS2VASc ~ 5 and will likely transition to PO anticoagulation long-term once all invasive procedures performed. Pharmacy consulted for heparin drip at this time.  2/2 @ 0247 HL = 0.44, therapeutic x 1.  CBC stable.   Goal of Therapy:  Heparin level 0.3-0.7 units/ml Monitor platelets by anticoagulation  protocol: Yes   Plan:  Continue heparin drip at 850 units/hr and recheck HL in 8 hours to confirm. CBC daily while on heparin drip.  Ena Dawley, PharmD 05/12/2019,3:49 AM

## 2019-05-12 NOTE — CV Procedure (Signed)
    Cardioversion Note  Megan Shelton [id number] Apr 28, 1940  Procedure: DC Cardioversion Indications: Atrial fibrillation with rapid ventricular response  Procedure Details Consent: Obtained Time Out: Verified patient identification, verified procedure, site/side was marked, verified correct patient position, special equipment/implants available, Radiology Safety Procedures followed,  medications/allergies/relevent history reviewed, required imaging and test results available.  Performed  The patient has been on adequate anticoagulation since atrial fibrillation began yesterday afternoon (duration of a-fib < 48 hours).  The patient received IV propofol by anesthesia for sedation.  Synchronous cardioversion was performed at 200 joules x 1.  The cardioversion was successful with restoration of sinus rhythm.  Complications: No apparent complications Patient did tolerate procedure well.  Nelva Bush., MD 05/12/2019, 8:34 AM

## 2019-05-12 NOTE — Progress Notes (Addendum)
[facility]   Communication Note The patient is a 79 year old female with a past medical history significant for congestive heart failure with AICD placement, history of thyroid cancer s/p thyroidectomy, chronic smoker but quit smoking in 2006. Recently, patient presented with pain in her left arm which led to an x-ray. X-ray showed comminuted possibly pathologic midshaft humeral fracture. This was followed by a whole-body bone scan which showed multiple sites of abnormal tracer uptake involving left scapula, bilateral posterior ribs, thoracic and lumbar spine, pelvis and multiple sites in the proximal and distal left femur as well as left humeral diaphysis.Brain CT showed 75mm left frontal lobe mass with mild edema consistent with metastatic disease.  Patient was scheduled for port-a-cath insertion with Dr. Lucky Cowboy on Thursday 05/14/19 as an outpatient however will plan to keep procedure date now that she is inpatient.   Meet with patient today. Discussed procedure, risks and benefits in detail. All questions answered. The patient wishes to proceed. Willl pre-op.   Discussed with Dr. Ellis Parents Irlene Crudup PA-C 05/12/2019 11:59 AM

## 2019-05-12 NOTE — Anesthesia Postprocedure Evaluation (Signed)
Anesthesia Post Note  Patient: Megan Shelton  Procedure(s) Performed: VIDEO BRONCHOSCOPY WITH ENDOBRONCHIAL ULTRASOUND (N/A )  Patient location during evaluation: ICU Level of consciousness: awake and awake and alert Respiratory status: spontaneous breathing, respiratory function stable and nonlabored ventilation Cardiovascular status: blood pressure returned to baseline and stable Anesthetic complications: no     Last Vitals:  Vitals:   05/12/19 0835 05/12/19 0836  BP: (!) 97/54 (!) 97/54  Pulse: (!) 55 (!) 56  Resp: 19 (!) 22  Temp:    SpO2: 96% 95%    Last Pain:  Vitals:   05/12/19 0800  TempSrc: Oral  PainSc: 0-No pain                 [name]

## 2019-05-13 ENCOUNTER — Ambulatory Visit: Payer: Medicare Other

## 2019-05-13 ENCOUNTER — Inpatient Hospital Stay: Payer: Medicare Other

## 2019-05-13 ENCOUNTER — Other Ambulatory Visit (INDEPENDENT_AMBULATORY_CARE_PROVIDER_SITE_OTHER): Payer: Self-pay | Admitting: Vascular Surgery

## 2019-05-13 DIAGNOSIS — C7931 Secondary malignant neoplasm of brain: Secondary | ICD-10-CM

## 2019-05-13 DIAGNOSIS — R062 Wheezing: Secondary | ICD-10-CM

## 2019-05-13 DIAGNOSIS — E1140 Type 2 diabetes mellitus with diabetic neuropathy, unspecified: Secondary | ICD-10-CM

## 2019-05-13 LAB — BASIC METABOLIC PANEL
Anion gap: 11 (ref 5–15)
BUN: 13 mg/dL (ref 8–23)
CO2: 24 mmol/L (ref 22–32)
Calcium: 7.6 mg/dL — ABNORMAL LOW (ref 8.9–10.3)
Chloride: 105 mmol/L (ref 98–111)
Creatinine, Ser: 0.41 mg/dL — ABNORMAL LOW (ref 0.44–1.00)
GFR calc Af Amer: >60 mL/min (ref >60)
GFR calc non Af Amer: >60 mL/min (ref >60)
Glucose, Bld: 245 mg/dL — ABNORMAL HIGH (ref 70–99)
Potassium: 3.6 mmol/L (ref 3.5–5.1)
Sodium: 140 mmol/L (ref 135–145)

## 2019-05-13 LAB — CBC
HCT: 41.7 % (ref 36.0–46.0)
Hemoglobin: 13.6 g/dL (ref 12.0–15.0)
MCH: 27.2 pg (ref 26.0–34.0)
MCHC: 32.6 g/dL (ref 30.0–36.0)
MCV: 83.4 fL (ref 80.0–100.0)
Platelets: 222 10*3/uL (ref 150–400)
RBC: 5 MIL/uL (ref 3.87–5.11)
RDW: 15 % (ref 11.5–15.5)
WBC: 11.8 10*3/uL — ABNORMAL HIGH (ref 4.0–10.5)
nRBC: 0 % (ref 0.0–0.2)

## 2019-05-13 LAB — GLUCOSE, CAPILLARY
Glucose-Capillary: 324 mg/dL — ABNORMAL HIGH (ref 70–99)
Glucose-Capillary: 185 mg/dL — ABNORMAL HIGH (ref 70–99)
Glucose-Capillary: 199 mg/dL — ABNORMAL HIGH (ref 70–99)
Glucose-Capillary: 256 mg/dL — ABNORMAL HIGH (ref 70–99)

## 2019-05-13 LAB — T4, FREE: Free T4: 1.68 ng/dL — ABNORMAL HIGH (ref 0.61–1.12)

## 2019-05-13 LAB — MAGNESIUM: Magnesium: 2 mg/dL (ref 1.7–2.4)

## 2019-05-13 LAB — HEPARIN LEVEL (UNFRACTIONATED): Heparin Unfractionated: 0.3 IU/mL (ref 0.30–0.70)

## 2019-05-13 LAB — CEA: CEA: 114 ng/mL — ABNORMAL HIGH (ref 0.0–4.7)

## 2019-05-13 MED ORDER — FENTANYL CITRATE (PF) 100 MCG/2ML IJ SOLN
INTRAMUSCULAR | Status: AC | PRN
  Administered 2019-05-13 (×2): 25 ug via INTRAVENOUS

## 2019-05-13 MED ORDER — INSULIN GLARGINE 100 UNIT/ML ~~LOC~~ SOLN
15.0000 [IU] | Freq: Every day | SUBCUTANEOUS | Status: DC
  Administered 2019-05-13: 15 [IU] via SUBCUTANEOUS
  Filled 2019-05-13 (×2): qty 0.15

## 2019-05-13 MED ORDER — FENTANYL CITRATE (PF) 100 MCG/2ML IJ SOLN
INTRAMUSCULAR | Status: AC
  Filled 2019-05-13: qty 2

## 2019-05-13 MED ORDER — MIDAZOLAM HCL 5 MG/5ML IJ SOLN
INTRAMUSCULAR | Status: AC
  Filled 2019-05-13: qty 5

## 2019-05-13 MED ORDER — SODIUM CHLORIDE 0.9 % IV SOLN
INTRAVENOUS | Status: AC | PRN
  Administered 2019-05-13: 10 mL/h via INTRAVENOUS

## 2019-05-13 MED ORDER — LEVOTHYROXINE SODIUM 100 MCG PO TABS
100.0000 ug | ORAL_TABLET | Freq: Every day | ORAL | Status: DC
  Administered 2019-05-14: 100 ug via ORAL
  Filled 2019-05-13: qty 1

## 2019-05-13 MED ORDER — MIDAZOLAM HCL 2 MG/2ML IJ SOLN
INTRAMUSCULAR | Status: AC | PRN
  Administered 2019-05-13 (×2): 1 mg via INTRAVENOUS

## 2019-05-13 MED ORDER — BUDESONIDE 0.5 MG/2ML IN SUSP
0.5000 mg | Freq: Two times a day (BID) | RESPIRATORY_TRACT | Status: DC
  Administered 2019-05-13 – 2019-05-14 (×2): 0.5 mg via RESPIRATORY_TRACT
  Filled 2019-05-13 (×2): qty 2

## 2019-05-13 MED ORDER — IPRATROPIUM-ALBUTEROL 0.5-2.5 (3) MG/3ML IN SOLN
3.0000 mL | Freq: Four times a day (QID) | RESPIRATORY_TRACT | Status: DC
  Administered 2019-05-13 – 2019-05-14 (×3): 3 mL via RESPIRATORY_TRACT
  Filled 2019-05-13 (×3): qty 3

## 2019-05-13 NOTE — Progress Notes (Signed)
Request for CT guided bone lesion biopsy.  Patient with left lung lesion and bronchoscopy was not performed due to atrial fibrillation.  Patient is now inpatient and on IV heparin.  History and hospital course have been reviewed. Physical Exam:   Lungs: Few wheezes in left lung.  Right lung clear Heart:  Currently NSR.   Assessment:  79 yo with left lung lesion lesion and bone lesions.  Needs tissue diagnosis.  Reviewed recent PET/CT and left acetabular lesion is amendable for biopsy. Plan:  CT guided biopsy of left acetabular lesion.  Risks and benefits of CT guided biopsy was discussed with the patient and/or patient's family including, but not limited to bleeding, infection, damage to adjacent structures or low yield requiring additional tests. All of the questions were answered and there is agreement to proceed.  Consent signed and in chart.

## 2019-05-13 NOTE — Procedures (Signed)
Interventional Radiology Procedure:   Indications: Left lung lesion and bone lesions  Procedure: CT guided biopsy of left acetabular lesion  Findings: Lucent lesion in left acetabulum.  Bone cores and blood clot obtained.    Complications: None     EBL: less than 10 ml  Plan: Will resume heparin 2 hours after biopsy.   Cheryllynn Sarff R. Anselm Pancoast, MD  Pager: [phone]

## 2019-05-13 NOTE — Progress Notes (Signed)
Pt is off unit for bone biopsy at this time.

## 2019-05-13 NOTE — Progress Notes (Signed)
Inpatient Diabetes Program Recommendations  AACE/ADA: New Consensus Statement on Inpatient Glycemic Control (2015)  Target Ranges:  Prepandial:   less than 140 mg/dL      Peak postprandial:   less than 180 mg/dL (1-2 hours)      Critically ill patients:  140 - 180 mg/dL   Lab Results  Component Value Date   GLUCAP 324 (H) 05/13/2019   HGBA1C 7.1 (H) 05/11/2019    Review of Glycemic Control  Results for BRIANNI, MANTHE (MRN [id number]) as of 05/13/2019 11:35  Ref. Range 05/12/2019 07:23 05/12/2019 11:29 05/12/2019 16:40 05/12/2019 21:40 05/13/2019 07:29  Glucose-Capillary Latest Ref Range: 70 - 99 mg/dL 209 (H) 203 (H) 254 (H) 263 (H) 324 (H)    Diabetes history: DM2 Outpatient Diabetes medications: Amaryl 1 mg BID + Metformin 500 mg BID + Januvia 100 mg QD Current orders for Inpatient glycemic control: Novolog 0-9 TID + 0-5 QHS + decadron 4 mg Q12H   Inpatient Diabetes Program Recommendations:     -lantus 15 units daily  Thank you, Reche Dixon, RN, BSN Diabetes Coordinator Inpatient Diabetes Program [phone] (team pager from 8a-5p)

## 2019-05-13 NOTE — Progress Notes (Signed)
Progress Note  Patient Name: Megan Shelton Date of Encounter: 05/13/2019  Primary Cardiologist: Virl Axe, MD   Subjective   Doing okay, denies any chest pain, palpitations.  Bone biopsy is scheduled for later on today due to recent diagnosis of cancer.  Heparin drip being held for procedure.  Inpatient Medications    Scheduled Meds: . atorvastatin  40 mg Oral Daily  . butamben-tetracaine-benzocaine  1 spray Topical Once  . Chlorhexidine Gluconate Cloth  6 each Topical Daily  . dexamethasone  4 mg Oral BID WC  . fentaNYL      . influenza vaccine adjuvanted  0.5 mL Intramuscular Tomorrow-1000  . insulin aspart  0-5 Units Subcutaneous QHS  . insulin aspart  0-9 Units Subcutaneous TID WC  . insulin glargine  15 Units Subcutaneous QHS  . [START ON 05/14/2019] levothyroxine  100 mcg Oral Q0600  . lisinopril  10 mg Oral Daily  . metoprolol tartrate  25 mg Oral BID  . midazolam      . pyridOXINE  100 mg Oral Daily  . vitamin B-12  100 mcg Oral Daily   Continuous Infusions: . sodium chloride 75 mL/hr at 05/13/19 1226  . heparin Stopped (05/13/19 0944)   PRN Meds: acetaminophen **OR** acetaminophen, clonazePAM, guaiFENesin-dextromethorphan, hydrALAZINE, hydrocortisone, ondansetron **OR** ondansetron (ZOFRAN) IV, oxyCODONE   Vital Signs    Vitals:   05/13/19 1200 05/13/19 1300 05/13/19 1409 05/13/19 1409  BP: (!) 154/77 (!) 152/62 (!) 158/82 (!) 158/82  Pulse: 72 74 78 78  Resp: (!) 21 (!) 21 17 17   Temp:      TempSrc:      SpO2: 95% 94% 95% 95%  Weight:      Height:        Intake/Output Summary (Last 24 hours) at 05/13/2019 1436 Last data filed at 05/13/2019 1226 Gross per 24 hour  Intake 1969.11 ml  Output 1975 ml  Net -5.89 ml   Last 3 Weights 05/13/2019 05/11/2019 05/07/2019  Weight (lbs) 161 lb 13.1 oz 157 lb 160 lb  Weight (kg) 73.4 kg 71.215 kg 72.576 kg      Telemetry    Sinus rhythm, heart rate 74- Personally Reviewed  ECG    No new ECG obtained today-  Personally Reviewed  Physical Exam   GEN: No acute distress.   Neck: No JVD Cardiac: RRR, no murmurs, rubs, or gallops.  Respiratory: Clear to auscultation bilaterally. GI: Soft, nontender, non-distended  MS: No edema; No deformity. Neuro:  Nonfocal  Psych: Normal affect   Labs    High Sensitivity Troponin:   Recent Labs  Lab 05/11/19 1418 05/11/19 1818 05/11/19 1932 05/11/19 2305  TROPONINIHS 40* 38* 41* 37*      Chemistry Recent Labs  Lab 05/11/19 1418 05/12/19 0247 05/13/19 0605  NA 138 138 140  K 4.2 4.4 3.6  CL 104 104 105  CO2 24 25 24   GLUCOSE 267* 266* 245*  BUN 25* 21 13  CREATININE 0.55 0.65 0.41*  CALCIUM 8.3* 7.8* 7.6*  ALBUMIN 3.6  --   --   GFRNONAA >60 >60 >60  GFRAA >60 >60 >60  ANIONGAP 10 9 11      Hematology Recent Labs  Lab 05/11/19 1418 05/12/19 0247 05/13/19 0605  WBC 14.2* 11.1* 11.8*  RBC 5.04 4.76 5.00  HGB 14.0 13.1 13.6  HCT 42.6 41.0 41.7  MCV 84.5 86.1 83.4  MCH 27.8 27.5 27.2  MCHC 32.9 32.0 32.6  RDW 15.6* 15.8* 15.0  PLT 287 247  222    BNPNo results for input(s): BNP, PROBNP in the last 168 hours.   DDimer No results for input(s): DDIMER in the last 168 hours.   Radiology    ECHOCARDIOGRAM COMPLETE  Result Date: 05/12/2019   ECHOCARDIOGRAM REPORT   Patient Name:   Megan Shelton Date of Exam: 05/12/2019 Medical Rec #:  [id number]       Height:       60.0 in Accession #:    [id number]      Weight:       157.0 lb Date of Birth:  01/20/1941        BSA:          1.68 m Patient Age:    79 years        BP:           133/68 mmHg Patient Gender: F               HR:           74 bpm. Exam Location:  [facility] Procedure: 2D Echo, Color Doppler and Cardiac Doppler Indications:     Atrial fibrillation  History:         Patient has no prior history of Echocardiogram examinations.                  CHF, Previous Myocardial Infarction, Defibrillator; Risk                  Factors:Hypertension and Diabetes.  Sonographer:     Charmayne Sheer RDCS  (AE) Referring Phys:  Baker Janus Soledad Gerlach NIU Diagnosing Phys: Nelva Bush MD IMPRESSIONS  1. Left ventricular ejection fraction, by visual estimation, is 45 to 50%. The left ventricle has mildly decreased function. There is no left ventricular hypertrophy.  2. Elevated left atrial pressure.  3. Left ventricular diastolic parameters are consistent with Grade II diastolic dysfunction (pseudonormalization).  4. Mild to moderately dilated left ventricular internal cavity size.  5. The left ventricle demonstrates regional wall motion abnormalities.  6. Global right ventricle has normal systolic function.The right ventricular size is normal. No increase in right ventricular wall thickness.  7. Left atrial size was mildly dilated.  8. Right atrial size was not well visualized.  9. The mitral valve is degenerative. Mild to moderate mitral valve regurgitation. No evidence of mitral stenosis. 10. The tricuspid valve is not well visualized. 11. The tricuspid valve is not well visualized. Tricuspid valve regurgitation is trivial. 12. The aortic valve was not well visualized. Aortic valve regurgitation is trivial. Mild to moderate aortic valve sclerosis/calcification without any evidence of aortic stenosis. 13. The pulmonic valve was not well visualized. Pulmonic valve regurgitation is not visualized. 14. Mildly elevated pulmonary artery systolic pressure. 15. A pacer wire is visualized in the RV. 16. The inferior vena cava is dilated in size with <50% respiratory variability, suggesting right atrial pressure of 15 mmHg. 17. The interatrial septum was not well visualized. FINDINGS  Left Ventricle: Left ventricular ejection fraction, by visual estimation, is 45 to 50%. The left ventricle has mildly decreased function. Severe hypokinesis of the left ventricular, mid-apical anterior wall, anteroseptal wall and apical segment. The left ventricle demonstrates regional wall motion abnormalities. The left ventricular internal cavity size  was mildly to moderately dilated left ventricle. There is no left ventricular hypertrophy. Left ventricular diastolic parameters are consistent with  Grade II diastolic dysfunction (pseudonormalization). Elevated left atrial pressure. Right Ventricle: The right ventricular size is normal. No increase in right  ventricular wall thickness. Global RV systolic function is has normal systolic function. The tricuspid regurgitant velocity is 2.46 m/s, and with an assumed right atrial pressure  of 15 mmHg, the estimated right ventricular systolic pressure is mildly elevated at 39.2 mmHg. Left Atrium: Left atrial size was mildly dilated. Right Atrium: Right atrial size was not well visualized Pericardium: There is no evidence of pericardial effusion. Mitral Valve: The mitral valve is degenerative in appearance. There is mild thickening of the mitral valve leaflet(s). There is mild calcification of the mitral valve leaflet(s). Mild to moderate mitral valve regurgitation. No evidence of mitral valve stenosis by observation. MV peak gradient, 7.4 mmHg. Tricuspid Valve: The tricuspid valve is not well visualized. Tricuspid valve regurgitation is trivial. Aortic Valve: The aortic valve was not well visualized. . There is moderate thickening and mild calcification of the aortic valve. Aortic valve regurgitation is trivial. Mild to moderate aortic valve sclerosis/calcification is present, without any evidence of aortic stenosis. Mild aortic valve annular calcification. There is moderate thickening of the aortic valve. There is mild calcification of the aortic valve. Aortic valve mean gradient measures 4.0 mmHg. Aortic valve peak gradient measures 8.0  mmHg. Aortic valve area, by VTI measures 2.04 cm. Pulmonic Valve: The pulmonic valve was not well visualized. Pulmonic valve regurgitation is not visualized. Pulmonic regurgitation is not visualized. Aorta: The aortic root is normal in size and structure. Venous: The inferior vena  cava is dilated in size with less than 50% respiratory variability, suggesting right atrial pressure of 15 mmHg. IAS/Shunts: The interatrial septum was not well visualized. Additional Comments: A pacer wire is visualized in the right ventricle.  LEFT VENTRICLE PLAX 2D LVIDd:         5.67 cm  Diastology LVIDs:         4.05 cm  LV e' lateral:   5.66 cm/s LV PW:         0.95 cm  LV E/e' lateral: 20.0 LV IVS:        0.84 cm  LV e' medial:    4.90 cm/s LVOT diam:     2.00 cm  LV E/e' medial:  23.1 LV SV:         86 ml LV SV Index:   48.75 LVOT Area:     3.14 cm  LEFT ATRIUM             Index LA diam:        4.10 cm 2.43 cm/m LA Vol (A2C):   53.5 ml 31.77 ml/m LA Vol (A4C):   73.7 ml 43.76 ml/m LA Biplane Vol: 69.0 ml 40.97 ml/m  AORTIC VALVE                   PULMONIC VALVE AV Area (Vmax):    1.98 cm    PV Vmax:       0.82 m/s AV Area (Vmean):   2.16 cm    PV Vmean:      55.000 cm/s AV Area (VTI):     2.04 cm    PV VTI:        0.131 m AV Vmax:           141.00 cm/s PV Peak grad:  2.7 mmHg AV Vmean:          95.200 cm/s PV Mean grad:  1.0 mmHg AV VTI:            0.254 m AV Peak Grad:      8.0  mmHg AV Mean Grad:      4.0 mmHg LVOT Vmax:         88.70 cm/s LVOT Vmean:        65.600 cm/s LVOT VTI:          0.165 m LVOT/AV VTI ratio: 0.65  AORTA Ao Root diam: 3.30 cm MITRAL VALVE                         TRICUSPID VALVE MV Area (PHT): 5.84 cm              TR Peak grad:   24.2 mmHg MV Peak grad:  7.4 mmHg              TR Vmax:        267.00 cm/s MV Mean grad:  2.0 mmHg MV Vmax:       1.36 m/s              SHUNTS MV Vmean:      66.2 cm/s             Systemic VTI:  0.16 m MV VTI:        0.29 m                Systemic Diam: 2.00 cm MV PHT:        37.70 msec MV Decel Time: 130 msec MV E velocity: 113.00 cm/s 103 cm/s MV A velocity: 75.20 cm/s  70.3 cm/s MV E/A ratio:  1.50        1.5  Nelva Bush MD Electronically signed by Nelva Bush MD Signature Date/Time: 05/12/2019/4:59:28 PM    Final     Cardiac Studies    TTE 05/12/2019 1. Left ventricular ejection fraction, by visual estimation, is 45 to  50%. The left ventricle has mildly decreased function. There is no left  ventricular hypertrophy.  2. Elevated left atrial pressure.  3. Left ventricular diastolic parameters are consistent with Grade II  diastolic dysfunction (pseudonormalization).  4. Mild to moderately dilated left ventricular internal cavity size.  5. The left ventricle demonstrates regional wall motion abnormalities.  6. Global right ventricle has normal systolic function.The right  ventricular size is normal. No increase in right ventricular wall  thickness.  7. Left atrial size was mildly dilated.  8. Right atrial size was not well visualized.  9. The mitral valve is degenerative. Mild to moderate mitral valve  regurgitation. No evidence of mitral stenosis.  10. The tricuspid valve is not well visualized.  11. The tricuspid valve is not well visualized. Tricuspid valve  regurgitation is trivial.  12. The aortic valve was not well visualized. Aortic valve regurgitation  is trivial. Mild to moderate aortic valve sclerosis/calcification without  any evidence of aortic stenosis.  13. The pulmonic valve was not well visualized. Pulmonic valve  regurgitation is not visualized.  14. Mildly elevated pulmonary artery systolic pressure.  15. A pacer wire is visualized in the RV.  16. The inferior vena cava is dilated in size with <50% respiratory  variability, suggesting right atrial pressure of 15 mmHg.  65. The interatrial septum was not well visualized  Patient Profile     79 y.o. female with a history of CAD, s/p remote anterior MI, ICM with normalization of LVEF s/p ICD, hypertension, HLD, DM2, hypothyroidism, and seen today for new onset atrial fibrillation with rapid ventricular.  Patient is status post successful DC cardioversion yesterday.  Assessment & Plan  1.  Atrial fibrillation with rapid ventricular  response. -S/P successful DC cardioversion on 05/12/2019. -Currently in sinus rhythm -Heparin being held for bone biopsy today -Restart anticoagulation as soon as possible.  Transition to oral anticoagulation such as Eliquis or Xarelto prior to discharge. -Continue Lopressor for now  2.  History of ischemic cardiomyopathy status post ICD -Last EF mildly reduced at 45 to 50% -Transition Lopressor to Toprol XL upon discharge - lisinopril 10 mg daily.  3.  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

## 2019-05-13 NOTE — Progress Notes (Signed)
ANTICOAGULATION CONSULT NOTE  Pharmacy Consult for heparin Indication: atrial fibrillation  Patient Measurements: Height: 5' (152.4 cm) Weight: 161 lb 13.1 oz (73.4 kg) IBW/kg (Calculated) : 45.5 Heparin Dosing Weight: 61 kg  Vital Signs: Temp: 98.6 F (37 C) (02/03 0800) Temp Source: Oral (02/03 0800) BP: 168/67 (02/03 0800) Pulse Rate: 89 (02/03 0800)  Labs: Recent Labs    05/11/19 1418 05/11/19 1418 05/11/19 1818 05/11/19 1932 05/11/19 2305 05/12/19 0247 05/12/19 0247 05/12/19 1134 05/12/19 2152 05/13/19 0605  HGB 14.0   < >  --   --   --  13.1  --   --   --  13.6  HCT 42.6  --   --   --   --  41.0  --   --   --  41.7  PLT 287  --   --   --   --  247  --   --   --  222  APTT  --   --  27  --   --   --   --   --   --   --   LABPROT  --   --  14.2  --   --   --   --   --  14.2  --   INR  --   --  1.1  --   --   --   --   --  1.1  --   HEPARINUNFRC  --   --   --   --   --  0.44   < > 0.29* 0.40 0.30  CREATININE 0.55  --   --   --   --  0.65  --   --   --  0.41*  TROPONINIHS 40*   < > 38* 41* 37*  --   --   --   --   --    < > = values in this interval not displayed.    Estimated Creatinine Clearance: 52.7 mL/min (A) (by C-G formula based on SCr of 0.41 mg/dL (L)).   Medical History: Past Medical History:  Diagnosis Date  . AICD (automatic cardioverter/defibrillator) present   . Anxiety   . CHF (congestive heart failure) ([place])   . Diabetes mellitus with neuropathy ([place])   . Dyspnea   . Hypertension   . Hypertensive heart disease   . Implantable cardioverter-defibrillator (ICD)-[employer]   . Myocardial infarction, anterior wall ([place])   . Obesity, Class II, BMI 35-39.9     Assessment: 79 year old female with new onset afib w/ RVR. Cardiology consulted, recommend anticoagulation with heparin short term until invasive procedures performed. CHA2DS2VASc ~ 5 and will likely transition to PO anticoagulation long-term once all invasive procedures performed.  Pharmacy consulted for heparin drip. H&H, platelets wnl  Heparin Course: 2/2 0247 HL 0.44, no change 2/2 1134 HL 0.29, bolus 900 units, increase rate to 950 units/hr 2/2 2152 HL 0.40, no change 2/3 0605 HL 0.30, increased  to 1000 units/hr  2/3 1147 heparin stopped for upcoming bone  Biopsy today  Goal of Therapy:  Heparin level 0.3-0.7 units/ml Monitor platelets by anticoagulation protocol: Yes   Plan:   When bone biopsy is complete restart heparin at 1000 units/hr  Order heparin level 6 hours after heparin is restarted   CBC daily while on heparin drip: next 2/4 am  Vallery Sa, PharmD Clinical Pharmacist 05/13/2019 9:04 AM

## 2019-05-13 NOTE — Progress Notes (Signed)
Patient ID: Zniya Cottone, female   DOB: [birth year], 79 y.o.   MRN: [id number] [facility] PROGRESS NOTE  Harbor Paster HGD:[id number] DOB: Dec 20, 1940 DOA: 05/11/2019 PCP: Mar Daring, PA-C  HPI/Subjective: Patient feels okay.  Bronchoscopy was canceled secondary to rapid atrial fibrillation.  Patient was cardioverted yesterday and was in normal sinus rhythm this morning.  Objective: Vitals:   05/13/19 1545 05/13/19 1600  BP: (!) 158/77 (!) 146/78  Pulse: 75 73  Resp: 20 15  Temp:  98.9 F (37.2 C)  SpO2: 95% 96%    Intake/Output Summary (Last 24 hours) at 05/13/2019 1656 Last data filed at 05/13/2019 1610 Gross per 24 hour  Intake 2073.19 ml  Output 275 ml  Net 1798.19 ml   Filed Weights   05/11/19 1156 05/13/19 0500  Weight: 71.2 kg 73.4 kg    ROS: Review of Systems  Constitutional: Negative for chills and fever.  Eyes: Negative for blurred vision.  Respiratory: Negative for cough and shortness of breath.   Cardiovascular: Negative for chest pain.  Gastrointestinal: Negative for abdominal pain, constipation, diarrhea, nausea and vomiting.  Genitourinary: Negative for dysuria.  Musculoskeletal: Negative for joint pain.  Neurological: Negative for dizziness and headaches.   Exam: Physical Exam  Constitutional: She is oriented to person, place, and time.  HENT:  Nose: No mucosal edema.  Mouth/Throat: No oropharyngeal exudate or posterior oropharyngeal edema.  Eyes: Pupils are equal, round, and reactive to light. Conjunctivae, EOM and lids are normal.  Neck: Carotid bruit is not present.  Cardiovascular: Regular rhythm, S1 normal and S2 normal. Exam reveals no gallop.  No murmur heard. Respiratory: No respiratory distress. She has decreased breath sounds in the right lower field and the left lower field. She has wheezes in the right middle field and the left middle field. She has no rhonchi. She has no rales.  GI: Soft. Bowel sounds are normal. There is no  abdominal tenderness.  Musculoskeletal:     Right ankle: No swelling.     Left ankle: No swelling.  Lymphadenopathy:    She has no cervical adenopathy.  Neurological: She is alert and oriented to person, place, and time. No cranial nerve deficit.  Skin: Skin is warm. No rash noted. Nails show no clubbing.  Psychiatric: She has a normal mood and affect.      Data Reviewed: Basic Metabolic Panel: Recent Labs  Lab 05/11/19 1418 05/12/19 0247 05/13/19 0605  NA 138 138 140  K 4.2 4.4 3.6  CL 104 104 105  CO2 24 25 24   GLUCOSE 267* 266* 245*  BUN 25* 21 13  CREATININE 0.55 0.65 0.41*  CALCIUM 8.3* 7.8* 7.6*  MG 2.0  --  2.0  PHOS 4.2  --   --    Liver Function Tests: Recent Labs  Lab 05/11/19 1418  ALBUMIN 3.6   CBC: Recent Labs  Lab 05/08/19 0905 05/11/19 1418 05/12/19 0247 05/13/19 0605  WBC 9.5 14.2* 11.1* 11.8*  NEUTROABS  --  12.2*  --   --   HGB 14.4 14.0 13.1 13.6  HCT 44.5 42.6 41.0 41.7  MCV 84.0 84.5 86.1 83.4  PLT 232 287 247 222    CBG: Recent Labs  Lab 05/12/19 1640 05/12/19 2140 05/13/19 0729 05/13/19 1208 05/13/19 1629  GLUCAP 254* 263* 324* 185* 199*    Recent Results (from the past 240 hour(s))  SARS CORONAVIRUS 2 (TAT 6-24 HRS) Nasopharyngeal Nasopharyngeal Swab     Status: None   Collection Time: 05/08/19  9:32 AM   Specimen: Nasopharyngeal Swab  Result Value Ref Range Status   SARS Coronavirus 2 NEGATIVE NEGATIVE Final    Comment: (NOTE) SARS-CoV-2 target nucleic acids are NOT DETECTED. The SARS-CoV-2 RNA is generally detectable in upper and lower respiratory specimens during the acute phase of infection. Negative results do not preclude SARS-CoV-2 infection, do not rule out co-infections with other pathogens, and should not be used as the sole basis for treatment or other patient management decisions. Negative results must be combined with clinical observations, patient history, and epidemiological information. The  expected result is Negative. Fact Sheet for Patients: SugarRoll.be Fact Sheet for Healthcare Providers: https://www.woods-mathews.com/ This test is not yet approved or cleared by the [place] FDA and  has been authorized for detection and/or diagnosis of SARS-CoV-2 by FDA under an Emergency Use Authorization (EUA). This EUA will remain  in effect (meaning this test can be used) for the duration of the COVID-19 declaration under Section 56 4(b)(1) of the Act, 21 U.S.C. section 360bbb-3(b)(1), unless the authorization is terminated or revoked sooner. Performed at [facility], [place] [address] [place], [place] [address]   MRSA PCR Screening     Status: None   Collection Time: 05/11/19 11:05 PM   Specimen: Nasopharyngeal  Result Value Ref Range Status   MRSA by PCR NEGATIVE NEGATIVE Final    Comment:        The GeneXpert MRSA Assay (FDA approved for NASAL specimens only), is one component of a comprehensive MRSA colonization surveillance program. It is not intended to diagnose MRSA infection nor to guide or monitor treatment for MRSA infections. Performed at [facility], [address] [place], [place] [address]        Scheduled Meds: . atorvastatin  40 mg Oral Daily  . butamben-tetracaine-benzocaine  1 spray Topical Once  . Chlorhexidine Gluconate Cloth  6 each Topical Daily  . dexamethasone  4 mg Oral BID WC  . fentaNYL      . influenza vaccine adjuvanted  0.5 mL Intramuscular Tomorrow-1000  . insulin aspart  0-5 Units Subcutaneous QHS  . insulin aspart  0-9 Units Subcutaneous TID WC  . insulin glargine  15 Units Subcutaneous QHS  . [START ON 05/14/2019] levothyroxine  100 mcg Oral Q0600  . lisinopril  10 mg Oral Daily  . metoprolol tartrate  25 mg Oral BID  . midazolam      . pyridOXINE  100 mg Oral Daily  . vitamin B-12  100 mcg Oral Daily   Continuous Infusions: . sodium chloride Stopped (05/13/19  1349)  . heparin Stopped (05/13/19 0944)    Assessment/Plan:  1. 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  CT-guided biopsy today for tissue diagnosis.  Port placement for tomorrow with vascular surgery.  Follow-up with Dr. Janese Banks oncology as outpatient along with Dr. Donella Stade radiation oncology. 2. Atrial fibrillation with rapid ventricular response.  Patient on metoprolol.  Cardioverted yesterday in normal sinus rhythm.  Patient on heparin drip on hold for procedures.  Hopefully will convert over to Eliquis upon discharge home. 3. Hypothyroidism unspecified.  TSH in hyperthyroid range.  Cut back on Synthroid dose. 4. Type 2 diabetes mellitus with neuropathy.  Hemoglobin A1c 6.4.  Started on Lantus insulin because patient was started on steroids secondary to brain mets. 5. Essential hypertension on lisinopril and metoprolol 6. Chronic systolic congestive heart failure.  Discontinue IV fluids 7. Wheeze.  Start DuoNeb and budesonide nebulizers.  Code Status:  Code Status Orders  (From admission, onward)         Start     Ordered   05/11/19 1809  Do not attempt resuscitation (DNR)  Continuous    Question Answer Comment  In the event of cardiac or respiratory ARREST Do not call a "code blue"   In the event of cardiac or respiratory ARREST Do not perform Intubation, CPR, defibrillation or ACLS   In the event of cardiac or respiratory ARREST Use medication by any route, position, wound care, and other measures to relive pain and suffering. May use oxygen, suction and manual treatment of airway obstruction as needed for comfort.      05/11/19 1808        Code Status History    Date Active Date Inactive Code Status Order ID Comments User Context   05/11/2019 1745 05/11/2019 1808 DNR [id number]  Ivor Costa, MD Inpatient   Advance Care Planning Activity    Advance Directive Documentation     Most Recent Value  Type of Advance Directive  Living will, Healthcare  Power of Attorney  Pre-existing out of facility DNR order (yellow form or pink MOST form)  --  "MOST" Form in Place?  --     Family Communication: Spoke with son at the bedside Disposition Plan: Patient had bone biopsy done today.  Patient will have Port-A-Cath placement tomorrow.  Need to see how she is doing after the procedure tomorrow prior to any disposition.  Consultants:  Critical care specialist  Cardiologist  Oncologist  Vascular  Time spent: 30 minutes, case discussed with critical care specialist  Tensley Wery [facility]  [facility]

## 2019-05-13 NOTE — Anesthesia Postprocedure Evaluation (Signed)
Anesthesia Post Note  Patient: Megan Shelton  Procedure(s) Performed: CARDIOVERSION (N/A )  Patient location during evaluation: SICU Anesthesia Type: General Level of consciousness: awake, awake and alert and oriented Pain management: pain level controlled Vital Signs Assessment: post-procedure vital signs reviewed and stable Respiratory status: spontaneous breathing and respiratory function stable Cardiovascular status: stable Postop Assessment: no apparent nausea or vomiting Anesthetic complications: no     Last Vitals:  Vitals:   05/13/19 0600 05/13/19 0700  BP: (!) 161/72 (!) 166/75  Pulse: 69 90  Resp: 17 17  Temp:    SpO2: 96% 95%    Last Pain:  Vitals:   05/13/19 0300  TempSrc: Oral  PainSc:                  Caryl Asp

## 2019-05-13 NOTE — Progress Notes (Signed)
[place] for heparin Indication: atrial fibrillation  Allergies  Allergen Reactions  . Latex Rash  . Nickel Rash   Patient Measurements: Height: 5' (152.4 cm) Weight: 161 lb 13.1 oz (73.4 kg) IBW/kg (Calculated) : 45.5 Heparin Dosing Weight: 61 kg  Vital Signs: Temp: 98.1 F (36.7 C) (02/03 0300) Temp Source: Oral (02/03 0300) BP: 166/75 (02/03 0700) Pulse Rate: 90 (02/03 0700)  Labs: Recent Labs    05/11/19 1418 05/11/19 1418 05/11/19 1818 05/11/19 1932 05/11/19 2305 05/12/19 0247 05/12/19 0247 05/12/19 1134 05/12/19 2152 05/13/19 0605  HGB 14.0   < >  --   --   --  13.1  --   --   --  13.6  HCT 42.6  --   --   --   --  41.0  --   --   --  41.7  PLT 287  --   --   --   --  247  --   --   --  222  APTT  --   --  27  --   --   --   --   --   --   --   LABPROT  --   --  14.2  --   --   --   --   --  14.2  --   INR  --   --  1.1  --   --   --   --   --  1.1  --   HEPARINUNFRC  --   --   --   --   --  0.44   < > 0.29* 0.40 0.30  CREATININE 0.55  --   --   --   --  0.65  --   --   --   --   TROPONINIHS 40*   < > 38* 41* 37*  --   --   --   --   --    < > = values in this interval not displayed.    Estimated Creatinine Clearance: 52.7 mL/min (by C-G formula based on SCr of 0.65 mg/dL).   Medical History: Past Medical History:  Diagnosis Date  . AICD (automatic cardioverter/defibrillator) present   . Anxiety   . CHF (congestive heart failure) ([place])   . Diabetes mellitus with neuropathy ([place])   . Dyspnea   . Hypertension   . Hypertensive heart disease   . Implantable cardioverter-defibrillator (ICD)-Medtronic   . 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 Obesity, Class II, BMI 35-39.9     Assessment: 79 year old female with new onset afib w/ RVR. Cardiology consulted, recommend anticoagulation with heparin short term until invasive procedures performed. CHA2DS2VASc ~ 5 and will likely transition to PO  anticoagulation long-term once all invasive procedures performed. Pharmacy consulted for heparin drip at this time.  2/2 0247 HL 0.44, therapeutic x 1.  CBC stable.  2/2 1134 HL 0.29, bolus 900 units, increase rate to 950 units/hr 2/2 2152 HL 0.40, therapeutic x 1 2/3 0605 HL 0.30, therapeutic x 2.  CBC stable.  Since level is at low end will increase Heparin to 1000 units/hr and recheck in 8 hours to confirm it has not dropped to subtherapeutic level.  Goal of Therapy:  Heparin level 0.3-0.7 units/ml Monitor platelets by anticoagulation protocol: Yes   Plan:  Increase Heparin infusion to 1000 units/hr. Check HL in 8 hours to confirm. CBC daily while on heparin drip.  Hart Robinsons, PharmD Clinical Pharmacist   05/13/2019 7:22 AM

## 2019-05-13 NOTE — Progress Notes (Signed)
Order received from Dr. Patsey Berthold that pt can travel to CT biopsy without ICU RN.

## 2019-05-14 ENCOUNTER — Ambulatory Visit: Admission: RE | Admit: 2019-05-14 | Payer: Medicare Other | Source: Home / Self Care | Admitting: Vascular Surgery

## 2019-05-14 ENCOUNTER — Other Ambulatory Visit: Payer: Medicare Other

## 2019-05-14 ENCOUNTER — Ambulatory Visit: Payer: Medicare Other

## 2019-05-14 ENCOUNTER — Encounter: Payer: Self-pay | Admitting: Cardiology

## 2019-05-14 ENCOUNTER — Encounter
Admission: AD | Disposition: A | Discharge status: Home / Self Care | Payer: Self-pay | Source: Home / Self Care | Attending: Internal Medicine

## 2019-05-14 DIAGNOSIS — I420 Dilated cardiomyopathy: Secondary | ICD-10-CM

## 2019-05-14 DIAGNOSIS — C3490 Malignant neoplasm of unspecified part of unspecified bronchus or lung: Secondary | ICD-10-CM

## 2019-05-14 HISTORY — PX: PORTA CATH INSERTION: CATH118285

## 2019-05-14 LAB — CBC
HCT: 43.6 % (ref 36.0–46.0)
Hemoglobin: 14.5 g/dL (ref 12.0–15.0)
MCH: 27.7 pg (ref 26.0–34.0)
MCHC: 33.3 g/dL (ref 30.0–36.0)
MCV: 83.2 fL (ref 80.0–100.0)
Platelets: 246 10*3/uL (ref 150–400)
RBC: 5.24 MIL/uL — ABNORMAL HIGH (ref 3.87–5.11)
RDW: 15.1 % (ref 11.5–15.5)
WBC: 13.9 10*3/uL — ABNORMAL HIGH (ref 4.0–10.5)
nRBC: 0.1 % (ref 0.0–0.2)

## 2019-05-14 LAB — HEPARIN LEVEL (UNFRACTIONATED)
Heparin Unfractionated: 0.3 IU/mL (ref 0.30–0.70)
Heparin Unfractionated: 0.22 IU/mL — ABNORMAL LOW (ref 0.30–0.70)

## 2019-05-14 LAB — GLUCOSE, CAPILLARY
Glucose-Capillary: 212 mg/dL — ABNORMAL HIGH (ref 70–99)
Glucose-Capillary: 250 mg/dL — ABNORMAL HIGH (ref 70–99)

## 2019-05-14 LAB — BASIC METABOLIC PANEL
Anion gap: 8 (ref 5–15)
BUN: 16 mg/dL (ref 8–23)
CO2: 26 mmol/L (ref 22–32)
Calcium: 8 mg/dL — ABNORMAL LOW (ref 8.9–10.3)
Chloride: 104 mmol/L (ref 98–111)
Creatinine, Ser: 0.57 mg/dL (ref 0.44–1.00)
GFR calc Af Amer: >60 mL/min (ref >60)
GFR calc non Af Amer: >60 mL/min (ref >60)
Glucose, Bld: 237 mg/dL — ABNORMAL HIGH (ref 70–99)
Potassium: 4 mmol/L (ref 3.5–5.1)
Sodium: 138 mmol/L (ref 135–145)

## 2019-05-14 SURGERY — PORTA CATH INSERTION
Anesthesia: Moderate Sedation

## 2019-05-14 MED ORDER — SODIUM CHLORIDE 0.9 % IV SOLN
Freq: Once | INTRAVENOUS | Status: DC
  Filled 2019-05-14: qty 2

## 2019-05-14 MED ORDER — FAMOTIDINE 20 MG PO TABS: 40.0000 mg | ORAL_TABLET | Freq: Once | ORAL | Status: DC | PRN

## 2019-05-14 MED ORDER — FUROSEMIDE 40 MG PO TABS: 40.0000 mg | ORAL_TABLET | Freq: Every day | ORAL | Status: DC | PRN

## 2019-05-14 MED ORDER — METOPROLOL TARTRATE 25 MG PO TABS: 25.0000 mg | ORAL_TABLET | Freq: Once | ORAL | Status: DC

## 2019-05-14 MED ORDER — METFORMIN HCL ER 500 MG PO TB24: 500.0000 mg | ORAL_TABLET | Freq: Two times a day (BID) | ORAL | Status: DC

## 2019-05-14 MED ORDER — DIPHENHYDRAMINE HCL 50 MG/ML IJ SOLN: 50.0000 mg | Freq: Once | INTRAMUSCULAR | Status: DC | PRN

## 2019-05-14 MED ORDER — MIDAZOLAM HCL 2 MG/2ML IJ SOLN
INTRAMUSCULAR | Status: DC | PRN
  Administered 2019-05-14: 2 mg via INTRAVENOUS

## 2019-05-14 MED ORDER — LEVOTHYROXINE SODIUM 100 MCG PO TABS: 100.0000 ug | ORAL_TABLET | Freq: Every day | ORAL | 0 refills | Status: DC

## 2019-05-14 MED ORDER — CEFAZOLIN SODIUM-DEXTROSE 2-4 GM/100ML-% IV SOLN
2.0000 g | Freq: Once | INTRAVENOUS | Status: AC
  Administered 2019-05-14: 2 g via INTRAVENOUS
  Filled 2019-05-14: qty 100

## 2019-05-14 MED ORDER — METOPROLOL TARTRATE 50 MG PO TABS: 25.0000 mg | ORAL_TABLET | Freq: Two times a day (BID) | ORAL | 0 refills | Status: DC

## 2019-05-14 MED ORDER — ONDANSETRON HCL 4 MG/2ML IJ SOLN: 4.0000 mg | Freq: Four times a day (QID) | INTRAMUSCULAR | Status: DC | PRN

## 2019-05-14 MED ORDER — HEPARIN SOD (PORK) LOCK FLUSH 100 UNIT/ML IV SOLN
500.0000 [IU] | INTRAVENOUS | Status: DC | PRN
  Filled 2019-05-14: qty 5

## 2019-05-14 MED ORDER — MIDAZOLAM HCL 5 MG/5ML IJ SOLN
INTRAMUSCULAR | Status: AC
  Filled 2019-05-14: qty 5

## 2019-05-14 MED ORDER — HYDROMORPHONE HCL 1 MG/ML IJ SOLN: 1.0000 mg | Freq: Once | INTRAMUSCULAR | Status: DC | PRN

## 2019-05-14 MED ORDER — METOPROLOL TARTRATE 50 MG PO TABS: 50.0000 mg | ORAL_TABLET | Freq: Two times a day (BID) | ORAL | 0 refills | Status: DC

## 2019-05-14 MED ORDER — INSULIN ASPART 100 UNIT/ML ~~LOC~~ SOLN: 4.0000 [IU] | Freq: Three times a day (TID) | SUBCUTANEOUS | Status: DC

## 2019-05-14 MED ORDER — ALBUTEROL SULFATE HFA 108 (90 BASE) MCG/ACT IN AERS: 1.0000 | INHALATION_SPRAY | Freq: Four times a day (QID) | RESPIRATORY_TRACT | 8 refills | Status: AC | PRN

## 2019-05-14 MED ORDER — METHYLPREDNISOLONE SODIUM SUCC 125 MG IJ SOLR: 125.0000 mg | Freq: Once | INTRAMUSCULAR | Status: DC | PRN

## 2019-05-14 MED ORDER — SODIUM CHLORIDE 0.9 % IV SOLN: INTRAVENOUS | Status: DC

## 2019-05-14 MED ORDER — APIXABAN 5 MG PO TABS: 5.0000 mg | ORAL_TABLET | Freq: Two times a day (BID) | ORAL | 0 refills | Status: DC

## 2019-05-14 MED ORDER — FENTANYL CITRATE (PF) 100 MCG/2ML IJ SOLN
INTRAMUSCULAR | Status: DC | PRN
  Administered 2019-05-14: 50 ug via INTRAVENOUS

## 2019-05-14 MED ORDER — MIDAZOLAM HCL 2 MG/ML PO SYRP
8.0000 mg | ORAL_SOLUTION | Freq: Once | ORAL | Status: DC | PRN
  Filled 2019-05-14: qty 4

## 2019-05-14 MED ORDER — FENTANYL CITRATE (PF) 100 MCG/2ML IJ SOLN
INTRAMUSCULAR | Status: AC
  Filled 2019-05-14: qty 2

## 2019-05-14 MED ORDER — GLIMEPIRIDE 2 MG PO TABS: 2.0000 mg | ORAL_TABLET | Freq: Two times a day (BID) | ORAL | 0 refills | Status: DC

## 2019-05-14 MED ORDER — APIXABAN 5 MG PO TABS
5.0000 mg | ORAL_TABLET | Freq: Two times a day (BID) | ORAL | Status: DC
  Administered 2019-05-14: 5 mg via ORAL
  Filled 2019-05-14: qty 1

## 2019-05-14 SURGICAL SUPPLY — 9 items
ELECT REM PT RETURN 9FT ADLT (ELECTROSURGICAL) ×3
ELECTRODE REM PT RTRN 9FT ADLT (ELECTROSURGICAL) ×1 IMPLANT
KIT PORT POWER 8FR ISP CVUE (Port) ×3 IMPLANT
PACK ANGIOGRAPHY (CUSTOM PROCEDURE TRAY) ×3 IMPLANT
PENCIL ELECTRO HAND CTR (MISCELLANEOUS) ×3 IMPLANT
SUT MNCRL AB 4-0 PS2 18 (SUTURE) ×3 IMPLANT
SUT PROLENE 0 CT 1 30 (SUTURE) ×3 IMPLANT
SUT VIC AB 3-0 SH 27 (SUTURE) ×2
SUT VIC AB 3-0 SH 27X BRD (SUTURE) ×1 IMPLANT

## 2019-05-14 NOTE — H&P (Signed)
[facility] History & Physical Update  The patient was interviewed and re-examined.  The patient's previous History and Physical has been reviewed and is unchanged.  There is no change in the plan of care. We plan to proceed with the scheduled procedure.  Leotis Pain, MD  05/14/2019, 9:17 AM

## 2019-05-14 NOTE — Op Note (Signed)
      [facility]       Operative Note  Date: 05/14/2019  Preoperative diagnosis:  1. Lung cancer  Postoperative diagnosis:  Same as above  Procedures: #1. Ultrasound guidance for vascular access to the right internal jugular vein. #2. Fluoroscopic guidance for placement of catheter. #3. Placement of CT compatible Port-A-Cath, right internal jugular vein.  Surgeon: Leotis Pain, MD.   Anesthesia: Local with moderate conscious sedation for approximately 15  minutes using 2 mg of Versed and 50 mcg of Fentanyl  Fluoroscopy time: less than 1 minute  Contrast used: 0  Estimated blood loss: 3 cc  Indication for the procedure:  The patient is a 79 y.o.female with lung cancer.  The patient needs a Port-A-Cath for durable venous access, chemotherapy, lab draws, and CT scans. We are asked to place this. Risks and benefits were discussed and informed consent was obtained.  Description of procedure: The patient was brought to the vascular and interventional radiology suite.  Moderate conscious sedation was administered throughout the procedure during a face to face encounter with the patient with my supervision of the RN administering medicines and monitoring the patient's vital signs, pulse oximetry, telemetry and mental status throughout from the start of the procedure until the patient was taken to the recovery room. The right neck chest and shoulder were sterilely prepped and draped, and a sterile surgical field was created. Ultrasound was used to help visualize a patent right internal jugular vein. This was then accessed under direct ultrasound guidance without difficulty with the Seldinger needle and a permanent image was recorded. A J-wire was placed. After skin nick and dilatation, the peel-away sheath was then placed over the wire. I then anesthetized an area under the clavicle approximately 1-2 fingerbreadths. A transverse incision was created and an inferior pocket was  created with electrocautery and blunt dissection. The port was then brought onto the field, placed into the pocket and secured to the chest wall with 2 Prolene sutures. The catheter was connected to the port and tunneled from the subclavicular incision to the access site. Fluoroscopic guidance was then used to cut the catheter to an appropriate length. The catheter was then placed through the peel-away sheath and the peel-away sheath was removed. The catheter tip was parked in excellent location under fluorocoscopic guidance in the cavoatrial junction. The pocket was then irrigated with antibiotic impregnated saline and the wound was closed with a running 3-0 Vicryl and a 4-0 Monocryl. The access incision was closed with a single 4-0 Monocryl. The Huber needle was used to withdraw blood and flush the port with heparinized saline. Dermabond was then placed as a dressing. The patient tolerated the procedure well and was taken to the recovery room in stable condition.   Leotis Pain 05/14/2019 10:13 AM   This note was created with Dragon Medical transcription system. Any errors in dictation are purely unintentional.

## 2019-05-14 NOTE — Progress Notes (Signed)
[place] for apixaban Indication: atrial fibrillation  Patient Measurements: Height: 5' (152.4 cm) Weight: 162 lb 0.6 oz (73.5 kg) IBW/kg (Calculated) : 45.5 Heparin Dosing Weight: 61 kg  Vital Signs: Temp: 98.2 F (36.8 C) (02/04 0400) Temp Source: Oral (02/04 0400) BP: 185/90 (02/04 0700) Pulse Rate: 71 (02/04 0700)  Labs: Recent Labs    05/11/19 1418 05/11/19 1818 05/11/19 1932 05/11/19 2305 05/12/19 0247 05/12/19 0247 05/12/19 1134 05/12/19 2152 05/13/19 0605 05/14/19 0020 05/14/19 0417  HGB   < >  --   --   --  13.1   < >  --   --  13.6  --  14.5  HCT   < >  --   --   --  41.0  --   --   --  41.7  --  43.6  PLT   < >  --   --   --  247  --   --   --  222  --  246  APTT  --  27  --   --   --   --   --   --   --   --   --   LABPROT  --  14.2  --   --   --   --   --  14.2  --   --   --   INR  --  1.1  --   --   --   --   --  1.1  --   --   --   HEPARINUNFRC  --   --   --   --  0.44  --    < > 0.40 0.30 0.30  --   CREATININE   < >  --   --   --  0.65  --   --   --  0.41*  --  0.57  TROPONINIHS  --  38* 41* 37*  --   --   --   --   --   --   --    < > = values in this interval not displayed.    Estimated Creatinine Clearance: 52.7 mL/min (by C-G formula based on SCr of 0.57 mg/dL).   Medical History: Past Medical History:  Diagnosis Date  . AICD (automatic cardioverter/defibrillator) present   . Anxiety   . CHF (congestive heart failure) ([place])   . Diabetes mellitus with neuropathy ([place])   . Dyspnea   . Hypertension   . Hypertensive heart disease   . Implantable cardioverter-defibrillator (ICD)-Medtronic   . Myocardial infarction, anterior wall ([place])   . Obesity, Class II, BMI 35-39.9     Assessment: 79 year old female with new onset afib w/ RVR. Cardiology consulted, recommend anticoagulation with heparin short term until invasive procedures performed. CHA2DS2VASc ~ 5 and will likely transition to PO anticoagulation  long-term once all invasive procedures performed. Pharmacy consulted for heparin drip. H&H, platelets wnl. She is being transitioned to apixaban to continue therapy at discharge  Heparin Course: 2/2 0247 HL 0.44, no change 2/2 1134 HL 0.29, bolus 900 units, increase rate to 950 units/hr 2/2 2152 HL 0.40, no change 2/3 0605 HL 0.30, increased  to 1000 units/hr  2/3 1147 heparin stopped for upcoming bone  Biopsy today 2/4 0020 HL 0.30, therapeutic.    Goal of Therapy:  Heparin level 0.3-0.7 units/ml Monitor platelets by anticoagulation protocol: Yes   Plan:   Stop heparin drip  1 hour after heparin drip has stopped start apixaban 5 mg twice daily  This patient meets 0/3 dose reduction criterea  CBC at least every 3 days if patient is not discharged  Vallery Sa, PharmD Clinical Pharmacist  05/14/2019 8:05 AM

## 2019-05-14 NOTE — Progress Notes (Signed)
Progress Note  Patient Name: Megan Shelton Date of Encounter: 05/14/2019  Primary Cardiologist: Virl Axe, MD   Subjective   Early morning NSVT, longest of which was a run of 21 beat NSVT. Telemetry also significant for brief 6 beat SVT. Rates otherwise in the high 50s to low 80s.  She states she felt "a little wheezy and lightheaded this AM" (around the time of her NSVT), but she attributes this feeling to spending so much time in bed and states that sometimes that happens to her. She is s/p her morning breathing tx. She states she feels well. No CP, racing HR, or palpitations. Slight SOB/DOE. She is scheduled for her port-a cath procedure later today.  Echo with slightly reduced pump function. She is about to go get her port-a-cath procedure; therefore, we did not yet discuss her slightly reduced LVEF or most recent echo results yet to confirm her understanding.   Inpatient Medications    Scheduled Meds: . atorvastatin  40 mg Oral Daily  . budesonide (PULMICORT) nebulizer solution  0.5 mg Nebulization BID  . butamben-tetracaine-benzocaine  1 spray Topical Once  . Chlorhexidine Gluconate Cloth  6 each Topical Daily  . dexamethasone  4 mg Oral BID WC  . gentamicin irrigation   Irrigation Once  . influenza vaccine adjuvanted  0.5 mL Intramuscular Tomorrow-1000  . insulin aspart  0-5 Units Subcutaneous QHS  . insulin aspart  0-9 Units Subcutaneous TID WC  . insulin glargine  15 Units Subcutaneous QHS  . ipratropium-albuterol  3 mL Nebulization Q6H  . levothyroxine  100 mcg Oral Q0600  . lisinopril  10 mg Oral Daily  . metoprolol tartrate  25 mg Oral BID  . pyridOXINE  100 mg Oral Daily  . vitamin B-12  100 mcg Oral Daily   Continuous Infusions: . sodium chloride    .  ceFAZolin (ANCEF) IV    . heparin 1,000 Units/hr (05/13/19 1727)   PRN Meds: acetaminophen **OR** acetaminophen, clonazePAM, diphenhydrAMINE, famotidine, guaiFENesin-dextromethorphan, hydrALAZINE,  hydrocortisone, HYDROmorphone (DILAUDID) injection, methylPREDNISolone (SOLU-MEDROL) injection, midazolam, ondansetron **OR** ondansetron (ZOFRAN) IV, ondansetron (ZOFRAN) IV, oxyCODONE   Vital Signs    Vitals:   05/14/19 0515 05/14/19 0600 05/14/19 0632 05/14/19 0700  BP: (!) 178/96 (!) 181/91 (!) 165/89 (!) 185/90  Pulse: 66 65 65 71  Resp: 15 15 18 18   Temp:      TempSrc:      SpO2: 97% 95% 95% 95%  Weight:      Height:        Intake/Output Summary (Last 24 hours) at 05/14/2019 0838 Last data filed at 05/14/2019 0500 Gross per 24 hour  Intake 599.18 ml  Output 1275 ml  Net -675.82 ml   Last 3 Weights 05/14/2019 05/13/2019 05/11/2019  Weight (lbs) 162 lb 0.6 oz 161 lb 13.1 oz 157 lb  Weight (kg) 73.5 kg 73.4 kg 71.215 kg      Telemetry    NSR, PVCs, NSVT (longest of 21 beats), SVT (longest 6 beats), rates high 50s to low 80s- Personally Reviewed  ECG    No new tracings - Personally Reviewed  Physical Exam   GEN: No acute distress.  Sitting on edge of bed. S/p breathing treatment. Neck: No JVD Cardiac: RRR, 1/6 systolic murmur. No rubs, or gallops.  Respiratory: Coarse breath sounds bilaterally. GI: Soft, nontender, non-distended  MS: No edema; No deformity. Neuro:  Nonfocal  Psych: Normal affect   Labs    High Sensitivity Troponin:   Recent Labs  Lab 05/11/19 1418 05/11/19 1818 05/11/19 1932 05/11/19 2305  TROPONINIHS 40* 38* 41* 37*      Cardiac EnzymesNo results for input(s): TROPONINI in the last 168 hours. No results for input(s): TROPIPOC in the last 168 hours.   Chemistry Recent Labs  Lab 05/11/19 1418 05/11/19 1418 05/12/19 0247 05/13/19 0605 05/14/19 0417  NA 138   < > 138 140 138  K 4.2   < > 4.4 3.6 4.0  CL 104   < > 104 105 104  CO2 24   < > 25 24 26   GLUCOSE 267*   < > 266* 245* 237*  BUN 25*   < > 21 13 16   CREATININE 0.55   < > 0.65 0.41* 0.57  CALCIUM 8.3*   < > 7.8* 7.6* 8.0*  ALBUMIN 3.6  --   --   --   --   GFRNONAA >60   < > >60  >60 >60  GFRAA >60   < > >60 >60 >60  ANIONGAP 10   < > 9 11 8    < > = values in this interval not displayed.     Hematology Recent Labs  Lab 05/12/19 0247 05/13/19 0605 05/14/19 0417  WBC 11.1* 11.8* 13.9*  RBC 4.76 5.00 5.24*  HGB 13.1 13.6 14.5  HCT 41.0 41.7 43.6  MCV 86.1 83.4 83.2  MCH 27.5 27.2 27.7  MCHC 32.0 32.6 33.3  RDW 15.8* 15.0 15.1  PLT 247 222 246    BNPNo results for input(s): BNP, PROBNP in the last 168 hours.   DDimer No results for input(s): DDIMER in the last 168 hours.   Radiology    CT BIOPSY  Result Date: 05/13/2019 INDICATION: 79 year old with a left lung lesion and multiple bone lesions. Tissue diagnosis is needed. EXAM: CT-GUIDED CORE BIOPSY OF LEFT ACETABULAR BONE LESION MEDICATIONS: None. ANESTHESIA/SEDATION: Moderate (conscious) sedation was employed during this procedure. A total of Versed 2.0 mg and Fentanyl 50 mcg was administered intravenously. Moderate Sedation Time: 35 minutes. The patient's level of consciousness and vital signs were monitored continuously by radiology nursing throughout the procedure under my direct supervision. FLUOROSCOPY TIME:  None COMPLICATIONS: None immediate. PROCEDURE: Informed written consent was obtained from the patient after a thorough discussion of the procedural risks, benefits and alternatives. All questions were addressed. A timeout was performed prior to the initiation of the procedure. Patient was placed supine on the CT scanner. Images through the pelvis were obtained. The lucent lesion in the superior left acetabulum was identified. The overlying skin was prepped with chlorhexidine and sterile field was created. Skin and soft tissues were anesthetized with 1% lidocaine. Using CT guidance, 17 gauge coaxial needle was directed into the lucent lesion through a small cortical bone defect. Needle position was confirmed within the lucent lesion. An 18 gauge core biopsy was obtained with a BioPince device. Core biopsy  was predominantly bone and the coaxial needle had to be completely removed in order to obtain the specimen. An 11 gauge bone needle was then directed into the lesion at the area of the cortical defect. Additional core biopsies were obtained with the coaxial bone biopsy device. An additional small bone core and blood clot was obtained. The small bone core specimens and blood clot were placed in formalin. Follow up CT images were obtained. Bandage placed over the puncture site. FINDINGS: Lucent lesion involving superior left acetabulum. Needles were directed into the lesion through the small cortical defect. Combination of small bone cores  and blood clot were obtained. IMPRESSION: CT-guided core biopsies of the left acetabular bone lesion. Electronically Signed   By: Markus Daft M.D.   On: 05/13/2019 17:14   ECHOCARDIOGRAM COMPLETE  Result Date: 05/12/2019   ECHOCARDIOGRAM REPORT   Patient Name:   DESHONDRA WORST Date of Exam: 05/12/2019 Medical Rec #:  [id number]       Height:       60.0 in Accession #:    [id number]      Weight:       157.0 lb Date of Birth:  12-16-1940        BSA:          1.68 m Patient Age:    30 years        BP:           133/68 mmHg Patient Gender: F               HR:           74 bpm. Exam Location:  [facility] Procedure: 2D Echo, Color Doppler and Cardiac Doppler Indications:     Atrial fibrillation  History:         Patient has no prior history of Echocardiogram examinations.                  CHF, Previous Myocardial Infarction, Defibrillator; Risk                  Factors:Hypertension and Diabetes.  Sonographer:     Charmayne Sheer RDCS (AE) Referring Phys:  Baker Janus Soledad Gerlach NIU Diagnosing Phys: Nelva Bush MD IMPRESSIONS  1. Left ventricular ejection fraction, by visual estimation, is 45 to 50%. The left ventricle has mildly decreased function. There is no left ventricular hypertrophy.  2. Elevated left atrial pressure.  3. Left ventricular diastolic parameters are consistent with Grade II diastolic  dysfunction (pseudonormalization).  4. Mild to moderately dilated left ventricular internal cavity size.  5. The left ventricle demonstrates regional wall motion abnormalities.  6. Global right ventricle has normal systolic function.The right ventricular size is normal. No increase in right ventricular wall thickness.  7. Left atrial size was mildly dilated.  8. Right atrial size was not well visualized.  9. The mitral valve is degenerative. Mild to moderate mitral valve regurgitation. No evidence of mitral stenosis. 10. The tricuspid valve is not well visualized. 11. The tricuspid valve is not well visualized. Tricuspid valve regurgitation is trivial. 12. The aortic valve was not well visualized. Aortic valve regurgitation is trivial. Mild to moderate aortic valve sclerosis/calcification without any evidence of aortic stenosis. 13. The pulmonic valve was not well visualized. Pulmonic valve regurgitation is not visualized. 14. Mildly elevated pulmonary artery systolic pressure. 15. A pacer wire is visualized in the RV. 16. The inferior vena cava is dilated in size with <50% respiratory variability, suggesting right atrial pressure of 15 mmHg. 17. The interatrial septum was not well visualized. FINDINGS  Left Ventricle: Left ventricular ejection fraction, by visual estimation, is 45 to 50%. The left ventricle has mildly decreased function. Severe hypokinesis of the left ventricular, mid-apical anterior wall, anteroseptal wall and apical segment. The left ventricle demonstrates regional wall motion abnormalities. The left ventricular internal cavity size was mildly to moderately dilated left ventricle. There is no left ventricular hypertrophy. Left ventricular diastolic parameters are consistent with  Grade II diastolic dysfunction (pseudonormalization). Elevated left atrial pressure. Right Ventricle: The right ventricular size is normal. No increase in right ventricular wall thickness. Global  RV systolic function is  has normal systolic function. The tricuspid regurgitant velocity is 2.46 m/s, and with an assumed right atrial pressure  of 15 mmHg, the estimated right ventricular systolic pressure is mildly elevated at 39.2 mmHg. Left Atrium: Left atrial size was mildly dilated. Right Atrium: Right atrial size was not well visualized Pericardium: There is no evidence of pericardial effusion. Mitral Valve: The mitral valve is degenerative in appearance. There is mild thickening of the mitral valve leaflet(s). There is mild calcification of the mitral valve leaflet(s). Mild to moderate mitral valve regurgitation. No evidence of mitral valve stenosis by observation. MV peak gradient, 7.4 mmHg. Tricuspid Valve: The tricuspid valve is not well visualized. Tricuspid valve regurgitation is trivial. Aortic Valve: The aortic valve was not well visualized. . There is moderate thickening and mild calcification of the aortic valve. Aortic valve regurgitation is trivial. Mild to moderate aortic valve sclerosis/calcification is present, without any evidence of aortic stenosis. Mild aortic valve annular calcification. There is moderate thickening of the aortic valve. There is mild calcification of the aortic valve. Aortic valve mean gradient measures 4.0 mmHg. Aortic valve peak gradient measures 8.0  mmHg. Aortic valve area, by VTI measures 2.04 cm. Pulmonic Valve: The pulmonic valve was not well visualized. Pulmonic valve regurgitation is not visualized. Pulmonic regurgitation is not visualized. Aorta: The aortic root is normal in size and structure. Venous: The inferior vena cava is dilated in size with less than 50% respiratory variability, suggesting right atrial pressure of 15 mmHg. IAS/Shunts: The interatrial septum was not well visualized. Additional Comments: A pacer wire is visualized in the right ventricle.  LEFT VENTRICLE PLAX 2D LVIDd:         5.67 cm  Diastology LVIDs:         4.05 cm  LV e' lateral:   5.66 cm/s LV PW:          0.95 cm  LV E/e' lateral: 20.0 LV IVS:        0.84 cm  LV e' medial:    4.90 cm/s LVOT diam:     2.00 cm  LV E/e' medial:  23.1 LV SV:         86 ml LV SV Index:   48.75 LVOT Area:     3.14 cm  LEFT ATRIUM             Index LA diam:        4.10 cm 2.43 cm/m LA Vol (A2C):   53.5 ml 31.77 ml/m LA Vol (A4C):   73.7 ml 43.76 ml/m LA Biplane Vol: 69.0 ml 40.97 ml/m  AORTIC VALVE                   PULMONIC VALVE AV Area (Vmax):    1.98 cm    PV Vmax:       0.82 m/s AV Area (Vmean):   2.16 cm    PV Vmean:      55.000 cm/s AV Area (VTI):     2.04 cm    PV VTI:        0.131 m AV Vmax:           141.00 cm/s PV Peak grad:  2.7 mmHg AV Vmean:          95.200 cm/s PV Mean grad:  1.0 mmHg AV VTI:            0.254 m AV Peak Grad:      8.0 mmHg AV Mean  Grad:      4.0 mmHg LVOT Vmax:         88.70 cm/s LVOT Vmean:        65.600 cm/s LVOT VTI:          0.165 m LVOT/AV VTI ratio: 0.65  AORTA Ao Root diam: 3.30 cm MITRAL VALVE                         TRICUSPID VALVE MV Area (PHT): 5.84 cm              TR Peak grad:   24.2 mmHg MV Peak grad:  7.4 mmHg              TR Vmax:        267.00 cm/s MV Mean grad:  2.0 mmHg MV Vmax:       1.36 m/s              SHUNTS MV Vmean:      66.2 cm/s             Systemic VTI:  0.16 m MV VTI:        0.29 m                Systemic Diam: 2.00 cm MV PHT:        37.70 msec MV Decel Time: 130 msec MV E velocity: 113.00 cm/s 103 cm/s MV A velocity: 75.20 cm/s  70.3 cm/s MV E/A ratio:  1.50        1.5  Harrell Gave End MD Electronically signed by Nelva Bush MD Signature Date/Time: 05/12/2019/4:59:28 PM    Final     Cardiac Studies   Cath/PCI:  LHC/PCI (04/2006, [place]): Severe disease involving the proximal LAD (question CTO) and 90% OM1 stenosis.  Successful PCI to OM1.  EP Procedures and Devices:  ICD implantation  Non-Invasive Evaluation(s):  Outside echocardiogram (10/08/2016): Normal LV size with mild LVH.  LVEF greater than 55% with grade 1 diastolic dysfunction.   Normal RV size and function.  Mild left atrial enlargement.  Aortic sclerosis.  Mild to moderate tricuspid regurgitation.  TTE 05/12/2019 1. Left ventricular ejection fraction, by visual estimation, is 45 to  50%. The left ventricle has mildly decreased function. There is no left  ventricular hypertrophy.  2. Elevated left atrial pressure.  3. Left ventricular diastolic parameters are consistent with Grade II  diastolic dysfunction (pseudonormalization).  4. Mild to moderately dilated left ventricular internal cavity size.  5. The left ventricle demonstrates regional wall motion abnormalities.  6. Global right ventricle has normal systolic function.The right  ventricular size is normal. No increase in right ventricular wall  thickness.  7. Left atrial size was mildly dilated.  8. Right atrial size was not well visualized.  9. The mitral valve is degenerative. Mild to moderate mitral valve  regurgitation. No evidence of mitral stenosis.  10. The tricuspid valve is not well visualized.  11. The tricuspid valve is not well visualized. Tricuspid valve  regurgitation is trivial.  12. The aortic valve was not well visualized. Aortic valve regurgitation  is trivial. Mild to moderate aortic valve sclerosis/calcification without  any evidence of aortic stenosis.  13. The pulmonic valve was not well visualized. Pulmonic valve  regurgitation is not visualized.  14. Mildly elevated pulmonary artery systolic pressure.  15. A pacer wire is visualized in the RV.  16. The inferior vena cava is dilated in size with <50% respiratory  variability, suggesting right atrial pressure of 15 mmHg.  42. The interatrial septum was not well visualized  Patient Profile     79 y.o. female with a history of CAD, s/p remote anterior MI, ICM with subsequent normalization of LVEF s/p ICD and most recent EF slightly reduced at 45-50%, hypertension, HLD, DM2, hypothyroidism, lung mass and METS, and seen today  for new onset atrial fibrillation with rapid ventricular response prior to anesthesia for upcoming bronchoscopy procedure on 05/11/2019.  Assessment & Plan    New onset atrial fibrillation with rapid ventricular response --Remains in NSR with ectopy and brief runs of reportedly asx tachycardia (PVCs, NSVT, SVT) since her DCCV earlier this week 05/12/19.  --Continue metoprolol 25mg  BID. Given reduced EF this admission to 45-50%, transition to Toprol XL prior to discharge per GDMT. --Given upcoming port-a-cath, hold IV heparin / anticoagulation and do not recommend transition to [facility] until perform all invasive procedures this admission. CHA2DS2VASc score of at least  7 (CHF, HTN, agex2, DM2, vascular, female) thus with need for long term anticoagulation. Will need to transition IV heparin to [facility] with Eliquis or Xarelto prior to discharge.  --Continue daily CBC and BMP.  TSH low with recommendations as below.  --Updated echo EF slightly reduced 45-50% from previous nl EF.  As an outpatient, could consider further ischemic workup with stress testing +/- catheterization with consideration of patient's comorbid conditions including current lung cancer / METS. She has a known history of CAD so cannot at this time completely rule out reduced EF 2/2 ischemia. Also considered is tachycardia induced CM. Follow-up in the office after discharge.  CAD s/p ICD --No CP. History of CAD s/p remote anterior MI as above.  Complete catheterization report from 2008 not available.  Suspicion that pLAD disease likely represents CTO.  ICM with normalization of LVEF s/p ICD and most recent EF slightly reduced.  HS Tn minimally elevated and flat trending.  Continue PTA metoprolol and lisinopril.  Transition metoprolol to Toprol XL prior to discharge given reduced EF and per GDMT. Continue statin for aggressive risk factor modification.  Daily BMET. As above, will need to consider further ischemic workup as an outpatient for slightly  reduced EF as cannot completely rule out reduced EF 2/2 cardiac ischemia.   Ischemic cardiomyopathy --Not volume overloaded on exam.  Slight SOB reported and improved s/p breathing tx and with consideration of lung CA. Continue lisinopril and BB (transition to Toprol XL prior to discharge per GDMT). PTA PRN Lasix. Daily BMET with current renal function stable. Previous echo with nl EF and slightly reduced this admission to 45-50%. Continue to follow with EP as an outpatient for her ICD.  HTN --Continue to monitor BP. Currently stable BP in the 120s.  Continue current medications.  HLD --Continue statin.  DM2 --Glycemic control recommended.  Hemoglobin A1c 7.1 and elevated.  SSI. Per IM.  Abnormal TSH --Continue PTA Synthroid.  TSH low 0.183.  Ordered free T4.  Per IM/endocrinology.     For questions or updates, please contact [place] Please consult [email or web address] for contact info under        Signed, Arvil Chaco, PA-C  05/14/2019, 8:38 AM

## 2019-05-14 NOTE — Discharge Summary (Signed)
[facility] NAME: Megan Shelton    MR#:  [id number]  DATE OF BIRTH:  12/04/1940  DATE OF ADMISSION:  05/11/2019 ADMITTING PHYSICIAN: Ivor Costa, MD  DATE OF DISCHARGE: 05/14/2019  PRIMARY CARE PHYSICIAN: Mar Daring, PA-C    ADMISSION DIAGNOSIS:  Atrial fibrillation with RVR ([place]) [I48.91]  DISCHARGE DIAGNOSIS:  Principal Problem:   Atrial fibrillation with RVR ([place]) Active Problems:   Diabetes mellitus with neuropathy ([place])   Essential hypertension   Chronic HFrEF (heart failure with reduced ejection fraction) (HCC)   Hyperlipidemia LDL goal <70   Bone metastases (HCC)   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 AICD (automatic cardioverter/defibrillator) present   . Anxiety   . CHF (congestive heart failure) ([place])   . Diabetes mellitus with neuropathy ([place])   . Dyspnea   . Hypertension   . Hypertensive heart disease   . Implantable cardioverter-defibrillator (ICD)-Medtronic   . Myocardial infarction, anterior wall ([place])   . Obesity, Class II, BMI 35-39.9     HOSPITAL COURSE:   1.  Atrial fibrillation with rapid ventricular response on presentation.  The patient was already on metoprolol as outpatient.  The patient was nervous for her bronchoscopy and when they put the monitor on she was in rapid A. fib.  She was brought into the ICU and cardiology cardioverted the patient and the patient has been in normal sinus rhythm.  Patient was on heparin drip and converted over to Eliquis upon discharge home.  30-day free card given by pharmacist. Cardiology recommended increasing the metoprolol to 50 mg twice a day. 2.  Stage IV cancer with bone and brain metastases likely lung primary.  CT guided biopsy of the bone done yesterday by interventional radiologist.  Results of pathology are still pending.  Patient had a port  placement today by Dr. Lazaro Arms vascular surgery.  Follow-up with Dr. Janese Banks oncology and Dr. Donella Stade radiation oncology. 3.  Hypothyroidism unspecified.  TSH in the hyperthyroid range.  I cut back on the Synthroid dose to 100 mcg. 4.  Type 2 diabetes mellitus with neuropathy.  Hemoglobin A1c is good at 6.4.  Because the patient is on steroids with brain mets the patient was started on Lantus insulin and sliding scale here in the hospital.  The patient does not want to go home on insulin.  I increased her glimepiride to 2 mg twice a day.  This medication can be titrated down once steroids are tapered.  Continue Glucophage and Januvia. 5.  Essential hypertension on lisinopril metoprolol 6.  Chronic systolic congestive heart failure discontinue IV fluids 7.  Wheeze improved.  Will prescribe albuterol inhaler upon going home  DISCHARGE CONDITIONS:   Satisfactory  CONSULTS OBTAINED:  Treatment Team:  Wellington Hampshire, MD Lucky Cowboy Erskine Squibb, MD Sindy Guadeloupe, MD  DRUG ALLERGIES:   Allergies  Allergen Reactions  . Latex Rash  . Nickel Rash    DISCHARGE MEDICATIONS:   Allergies as of 05/14/2019      Reactions   Latex Rash   Nickel Rash      Medication List    STOP taking these medications   ibuprofen 800 MG tablet Commonly known as: ADVIL     TAKE these medications   ACCU-CHEK COMPACT CARE KIT Kit To check blood sugar daily.   Accu-Chek Guide w/Device  Kit   Accu-Chek Compact Plus test strip Generic drug: glucose blood To check blood sugar daily.   albuterol 108 (90 Base) MCG/ACT inhaler Commonly known as: VENTOLIN HFA Inhale 1 puff into the lungs every 6 (six) hours as needed for wheezing or shortness of breath.   apixaban 5 MG Tabs tablet Commonly known as: ELIQUIS Take 1 tablet (5 mg total) by mouth 2 (two) times daily.   atorvastatin 40 MG tablet Commonly known as: LIPITOR TAKE 1 TABLET BY MOUTH  DAILY   clonazePAM 1 MG tablet Commonly known as: KLONOPIN TAKE 1 TABLET BY  MOUTH 3 TIMES A DAY AS NEEDED FOR ANXIETY What changed:  how much to take how to take this when to take this reasons to take this additional instructions   dexamethasone 4 MG tablet Commonly known as: DECADRON Take 1 tablet (4 mg total) by mouth 2 (two) times daily with a meal.   furosemide 40 MG tablet Commonly known as: LASIX Take 1 tablet (40 mg total) by mouth daily as needed for fluid.   glimepiride 2 MG tablet Commonly known as: AMARYL Take 1 tablet (2 mg total) by mouth 2 (two) times daily. What changed:  medication strength how much to take   hydrocortisone 1 % ointment Apply 1 application topically 2 (two) times daily. May use for skin irritation on bilateral eyelids. Do not get inside of eyes. What changed:  when to take this reasons to take this   levothyroxine 100 MCG tablet Commonly known as: SYNTHROID Take 1 tablet (100 mcg total) by mouth daily at 6 (six) AM. Start taking on: May 15, 2019 What changed:  medication strength See the new instructions.   lisinopril 10 MG tablet Commonly known as: ZESTRIL TAKE 1 TABLET BY MOUTH  DAILY   metFORMIN 500 MG 24 hr tablet Commonly known as: GLUCOPHAGE-XR Take 1 tablet (500 mg total) by mouth 2 (two) times daily. What changed: See the new instructions.   metoprolol tartrate 50 MG tablet Commonly known as: LOPRESSOR Take 1 tablet (50 mg total) by mouth 2 (two) times daily. What changed:  medication strength how much to take   ondansetron 4 MG disintegrating tablet Commonly known as: Zofran ODT Take 1 tablet (4 mg total) by mouth every 8 (eight) hours as needed for nausea or vomiting.   OneTouch Delica Lancets 36U Misc CHECK BLOOD SUGAR ONCE  DAILY   accu-chek soft touch lancets To check blood sugar daily.   oxyCODONE 5 MG immediate release tablet Commonly known as: Oxy IR/ROXICODONE Take 1 tablet (5 mg total) by mouth every 4 (four) hours as needed for severe pain.   pyridOXINE 100 MG  tablet Commonly known as: VITAMIN B-6 Take 100 mg by mouth daily.   sitaGLIPtin 100 MG tablet Commonly known as: JANUVIA Take 100 mg by mouth daily.   vitamin B-12 100 MCG tablet Commonly known as: CYANOCOBALAMIN Take 100 mcg by mouth daily.        DISCHARGE INSTRUCTIONS:   Follow-up PMD 5 days Follow-up oncology 1 week Follow-up cardiology 1 week  If you experience worsening of your admission symptoms, develop shortness of breath, life threatening emergency, suicidal or homicidal thoughts you must seek medical attention immediately by calling 911 or calling your MD immediately  if symptoms less severe.  You Must read complete instructions/literature along with all the possible adverse reactions/side effects for all the Medicines you take and that have been prescribed to you. Take any new Medicines after you have completely  understood and accept all the possible adverse reactions/side effects.   Please note  You were cared for by a hospitalist during your hospital stay. If you have any questions about your discharge medications or the care you received while you were in the hospital after you are discharged, you can call the unit and asked to speak with the hospitalist on call if the hospitalist that took care of you is not available. Once you are discharged, your primary care physician will handle any further medical issues. Please note that NO REFILLS for any discharge medications will be authorized once you are discharged, as it is imperative that you return to your primary care physician (or establish a relationship with a primary care physician if you do not have one) for your aftercare needs so that they can reassess your need for medications and monitor your lab values.    Today   CHIEF COMPLAINT:  No chief complaint on file.   HISTORY OF PRESENT ILLNESS:  Megan Shelton  is a 79 y.o. female sent in for atrial fibrillation rapid ventricular response   VITAL SIGNS:   Blood pressure 134/73, pulse 71, temperature 98.5 F (36.9 C), temperature source Oral, resp. rate 20, height 5' (1.524 m), weight 73.5 kg, SpO2 96 %.   PHYSICAL EXAMINATION:  GENERAL:  79 y.o.-year-old patient lying in the bed with no acute distress.  EYES: Pupils equal, round, reactive to light and accommodation. No scleral icterus. Extraocular muscles intact.  HEENT: Head atraumatic, normocephalic. Oropharynx and nasopharynx clear.  NECK:  Supple, no jugular venous distention. No thyroid enlargement, no tenderness.  LUNGS: Normal breath sounds bilaterally, no wheezing, rales,rhonchi or crepitation. No use of accessory muscles of respiration.  CARDIOVASCULAR: S1, S2 normal. No murmurs, rubs, or gallops.  ABDOMEN: Soft, non-tender, non-distended. Bowel sounds present. No organomegaly or mass.  EXTREMITIES: No pedal edema, cyanosis, or clubbing.  NEUROLOGIC: Cranial nerves II through XII are intact. Muscle strength 5/5 in all extremities. Sensation intact. Gait not checked.  PSYCHIATRIC: The patient is alert and oriented x 3.  SKIN: No obvious rash, lesion, or ulcer.   DATA REVIEW:   CBC Recent Labs  Lab 05/14/19 0417  WBC 13.9*  HGB 14.5  HCT 43.6  PLT 246    Chemistries  Recent Labs  Lab 05/13/19 0605 05/13/19 0605 05/14/19 0417  NA 140   < > 138  K 3.6   < > 4.0  CL 105   < > 104  CO2 24   < > 26  GLUCOSE 245*   < > 237*  BUN 13   < > 16  CREATININE 0.41*   < > 0.57  CALCIUM 7.6*   < > 8.0*  MG 2.0  --   --    < > = values in this interval not displayed.    Microbiology Results  Results for orders placed or performed during the hospital encounter of 05/11/19  MRSA PCR Screening     Status: None   Collection Time: 05/11/19 11:05 PM   Specimen: Nasopharyngeal  Result Value Ref Range Status   MRSA by PCR NEGATIVE NEGATIVE Final    Comment:        The GeneXpert MRSA Assay (FDA approved for NASAL specimens only), is one component of a comprehensive MRSA  colonization surveillance program. It is not intended to diagnose MRSA infection nor to guide or monitor treatment for MRSA infections. Performed at [facility], [address], [place], [place] [address]  RADIOLOGY:  PERIPHERAL VASCULAR CATHETERIZATION  Result Date: 05/14/2019 See op note  CT BIOPSY  Result Date: 05/13/2019 INDICATION: 79 year old with a left lung lesion and multiple bone lesions. Tissue diagnosis is needed. EXAM: CT-GUIDED CORE BIOPSY OF LEFT ACETABULAR BONE LESION MEDICATIONS: None. ANESTHESIA/SEDATION: Moderate (conscious) sedation was employed during this procedure. A total of Versed 2.0 mg and Fentanyl 50 mcg was administered intravenously. Moderate Sedation Time: 35 minutes. The patient's level of consciousness and vital signs were monitored continuously by radiology nursing throughout the procedure under my direct supervision. FLUOROSCOPY TIME:  None COMPLICATIONS: None immediate. PROCEDURE: Informed written consent was obtained from the patient after a thorough discussion of the procedural risks, benefits and alternatives. All questions were addressed. A timeout was performed prior to the initiation of the procedure. Patient was placed supine on the CT scanner. Images through the pelvis were obtained. The lucent lesion in the superior left acetabulum was identified. The overlying skin was prepped with chlorhexidine and sterile field was created. Skin and soft tissues were anesthetized with 1% lidocaine. Using CT guidance, 17 gauge coaxial needle was directed into the lucent lesion through a small cortical bone defect. Needle position was confirmed within the lucent lesion. An 18 gauge core biopsy was obtained with a BioPince device. Core biopsy was predominantly bone and the coaxial needle had to be completely removed in order to obtain the specimen. An 11 gauge bone needle was then directed into the lesion at the area of the cortical defect. Additional core  biopsies were obtained with the coaxial bone biopsy device. An additional small bone core and blood clot was obtained. The small bone core specimens and blood clot were placed in formalin. Follow up CT images were obtained. Bandage placed over the puncture site. FINDINGS: Lucent lesion involving superior left acetabulum. Needles were directed into the lesion through the small cortical defect. Combination of small bone cores and blood clot were obtained. IMPRESSION: CT-guided core biopsies of the left acetabular bone lesion. Electronically Signed   By: Markus Daft M.D.   On: 05/13/2019 17:14     Management plans discussed with the patient, family and they are in agreement.  CODE STATUS:     Code Status Orders  (From admission, onward)         Start     Ordered   05/11/19 1809  Do not attempt resuscitation (DNR)  Continuous    Question Answer Comment  In the event of cardiac or respiratory ARREST Do not call a "code blue"   In the event of cardiac or respiratory ARREST Do not perform Intubation, CPR, defibrillation or ACLS   In the event of cardiac or respiratory ARREST Use medication by any route, position, wound care, and other measures to relive pain and suffering. May use oxygen, suction and manual treatment of airway obstruction as needed for comfort.      05/11/19 1808        Code Status History    Date Active Date Inactive Code Status Order ID Comments User Context   05/11/2019 1745 05/11/2019 1808 DNR [id number]  Ivor Costa, MD Inpatient   Advance Care Planning Activity    Advance Directive Documentation     Most Recent Value  Type of Advance Directive  Living will, Healthcare Power of Attorney  Pre-existing out of facility DNR order (yellow form or pink MOST form)  --  "MOST" Form in Place?  --      TOTAL TIME TAKING CARE OF THIS PATIENT: 35 minutes.  Loletha Grayer M.D on 05/14/2019 at 3:50 PM  Between 7am to 6pm - Pager - [phone]  After 6pm go to [email or web address] -  password [email or web address]  [facility]  CC: Primary care physician; Mar Daring, PA-C

## 2019-05-14 NOTE — Progress Notes (Signed)
ANTICOAGULATION CONSULT NOTE  Pharmacy Consult for heparin Indication: atrial fibrillation  Patient Measurements: Height: 5' (152.4 cm) Weight: 161 lb 13.1 oz (73.4 kg) IBW/kg (Calculated) : 45.5 Heparin Dosing Weight: 61 kg  Vital Signs: Temp: 97.7 F (36.5 C) (02/04 0000) Temp Source: Axillary (02/04 0000) BP: 146/69 (02/04 0000) Pulse Rate: 66 (02/04 0000)  Labs: Recent Labs    05/11/19 1418 05/11/19 1418 05/11/19 1818 05/11/19 1932 05/11/19 2305 05/12/19 0247 05/12/19 1134 05/12/19 2152 05/13/19 0605 05/14/19 0020  HGB 14.0   < >  --   --   --  13.1  --   --  13.6  --   HCT 42.6  --   --   --   --  41.0  --   --  41.7  --   PLT 287  --   --   --   --  247  --   --  222  --   APTT  --   --  27  --   --   --   --   --   --   --   LABPROT  --   --  14.2  --   --   --   --  14.2  --   --   INR  --   --  1.1  --   --   --   --  1.1  --   --   HEPARINUNFRC  --   --   --   --   --  0.44   < > 0.40 0.30 0.30  CREATININE 0.55  --   --   --   --  0.65  --   --  0.41*  --   TROPONINIHS 40*   < > 38* 41* 37*  --   --   --   --   --    < > = values in this interval not displayed.    Estimated Creatinine Clearance: 52.7 mL/min (A) (by C-G formula based on SCr of 0.41 mg/dL (L)).   Medical History: Past Medical History:  Diagnosis Date  . AICD (automatic cardioverter/defibrillator) present   . Anxiety   . CHF (congestive heart failure) ([place])   . Diabetes mellitus with neuropathy ([place])   . Dyspnea   . Hypertension   . Hypertensive heart disease   . Implantable cardioverter-defibrillator (ICD)-Medtronic   . Myocardial infarction, anterior wall ([place])   . Obesity, Class II, BMI 35-39.9     Assessment: 79 year old female with new onset afib w/ RVR. Cardiology consulted, recommend anticoagulation with heparin short term until invasive procedures performed. CHA2DS2VASc ~ 5 and will likely transition to PO anticoagulation long-term once all invasive procedures performed.  Pharmacy consulted for heparin drip. H&H, platelets wnl  Heparin Course: 2/2 0247 HL 0.44, no change 2/2 1134 HL 0.29, bolus 900 units, increase rate to 950 units/hr 2/2 2152 HL 0.40, no change 2/3 0605 HL 0.30, increased  to 1000 units/hr  2/3 1147 heparin stopped for upcoming bone  Biopsy today 2/4 0020 HL 0.30, therapeutic.    Goal of Therapy:  Heparin level 0.3-0.7 units/ml Monitor platelets by anticoagulation protocol: Yes   Plan:   Continue heparin at 1000 units/hr  Recheck HL in 8 hours to confirm   CBC daily while on heparin drip: next 2/4 am  Hart Robinsons, PharmD Clinical Pharmacist  05/14/2019 1:05 AM

## 2019-05-14 NOTE — Progress Notes (Signed)
Inpatient Diabetes Program Recommendations  AACE/ADA: New Consensus Statement on Inpatient Glycemic Control (2015)  Target Ranges:  Prepandial:   less than 140 mg/dL      Peak postprandial:   less than 180 mg/dL (1-2 hours)      Critically ill patients:  140 - 180 mg/dL   Lab Results  Component Value Date   GLUCAP 250 (H) 05/14/2019   HGBA1C 7.1 (H) 05/11/2019    Review of Glycemic Control  Results for [name], [name] (MRN [id number]) as of 05/14/2019 12:47  Ref. Range 05/13/2019 21:41 05/14/2019 08:13 05/14/2019 11:43  Glucose-Capillary Latest Ref Range: 70 - 99 mg/dL 256 (H) 212 (H) 250 (H)    Diabetes history: DM2 Outpatient Diabetes medications: Amaryl 1 mg BID + Metformin 500 mg BID + Januvia 100 mg QD Current orders for Inpatient glycemic control: Novolog 0-9 TID + 0-5 QHS + Lantus 15 units QD + decadron 4 mg Q12H  Inpatient Diabetes Program Recommendations:     -Post prandials elevated.  Lantus 15 units QD started last night  -If steroids continue, please consider Novolog 4 units TID with meals if eats at least 50% of meal  Thank you, Reche Dixon, RN, BSN Diabetes Coordinator Inpatient Diabetes Program [phone] (team pager from 8a-5p)

## 2019-05-14 NOTE — Clinical Social Work Note (Signed)
Gave patient 30-day free Eliquis card. Patient has orders to discharge home today. CSW signing off.  Dayton Scrape, [place]

## 2019-05-15 ENCOUNTER — Ambulatory Visit: Payer: Medicare Other

## 2019-05-15 NOTE — Progress Notes (Signed)
No ICM remote transmission received for 05/11/2019 and next ICM transmission scheduled for 06/08/2019.

## 2019-05-18 ENCOUNTER — Ambulatory Visit: Payer: Medicare Other

## 2019-05-18 ENCOUNTER — Encounter: Payer: Self-pay | Admitting: Physician Assistant

## 2019-05-18 ENCOUNTER — Ambulatory Visit (INDEPENDENT_AMBULATORY_CARE_PROVIDER_SITE_OTHER): Payer: Medicare Other | Admitting: Physician Assistant

## 2019-05-18 ENCOUNTER — Other Ambulatory Visit: Payer: Self-pay

## 2019-05-18 ENCOUNTER — Ambulatory Visit
Admission: RE | Admit: 2019-05-18 | Discharge: 2019-05-18 | Disposition: A | Discharge status: Home / Self Care | Payer: Medicare Other | Source: Ambulatory Visit | Attending: Radiation Oncology | Admitting: Radiation Oncology

## 2019-05-18 ENCOUNTER — Other Ambulatory Visit: Payer: Self-pay | Admitting: Pathology

## 2019-05-18 ENCOUNTER — Inpatient Hospital Stay: Payer: Medicare Other

## 2019-05-18 ENCOUNTER — Telehealth: Payer: Self-pay | Admitting: Physician Assistant

## 2019-05-18 DIAGNOSIS — I4891 Unspecified atrial fibrillation: Secondary | ICD-10-CM

## 2019-05-18 DIAGNOSIS — E119 Type 2 diabetes mellitus without complications: Secondary | ICD-10-CM | POA: Diagnosis not present

## 2019-05-18 DIAGNOSIS — I110 Hypertensive heart disease with heart failure: Secondary | ICD-10-CM

## 2019-05-18 DIAGNOSIS — C3490 Malignant neoplasm of unspecified part of unspecified bronchus or lung: Secondary | ICD-10-CM | POA: Diagnosis not present

## 2019-05-18 DIAGNOSIS — I509 Heart failure, unspecified: Secondary | ICD-10-CM

## 2019-05-18 DIAGNOSIS — C7951 Secondary malignant neoplasm of bone: Secondary | ICD-10-CM | POA: Diagnosis not present

## 2019-05-18 DIAGNOSIS — C3412 Malignant neoplasm of upper lobe, left bronchus or lung: Secondary | ICD-10-CM | POA: Diagnosis not present

## 2019-05-18 DIAGNOSIS — C7931 Secondary malignant neoplasm of brain: Secondary | ICD-10-CM | POA: Diagnosis not present

## 2019-05-18 DIAGNOSIS — I10 Essential (primary) hypertension: Secondary | ICD-10-CM

## 2019-05-18 DIAGNOSIS — E034 Atrophy of thyroid (acquired): Secondary | ICD-10-CM

## 2019-05-18 DIAGNOSIS — Z510 Encounter for antineoplastic radiation therapy: Secondary | ICD-10-CM | POA: Diagnosis not present

## 2019-05-18 MED ORDER — LEVOTHYROXINE SODIUM 100 MCG PO TABS: 100.0000 ug | ORAL_TABLET | Freq: Every day | ORAL | 1 refills | Status: AC

## 2019-05-18 MED ORDER — METOPROLOL TARTRATE 50 MG PO TABS: 50.0000 mg | ORAL_TABLET | Freq: Two times a day (BID) | ORAL | 1 refills | Status: DC

## 2019-05-18 MED ORDER — GLIMEPIRIDE 2 MG PO TABS: 2.0000 mg | ORAL_TABLET | Freq: Two times a day (BID) | ORAL | 1 refills | Status: DC

## 2019-05-18 MED ORDER — METFORMIN HCL ER 500 MG PO TB24: 500.0000 mg | ORAL_TABLET | Freq: Two times a day (BID) | ORAL | 1 refills | Status: AC

## 2019-05-18 MED ORDER — APIXABAN 5 MG PO TABS: 5.0000 mg | ORAL_TABLET | Freq: Two times a day (BID) | ORAL | 1 refills | Status: AC

## 2019-05-18 MED ORDER — FUROSEMIDE 40 MG PO TABS: 40.0000 mg | ORAL_TABLET | Freq: Every day | ORAL | 1 refills | Status: AC | PRN

## 2019-05-18 NOTE — Telephone Encounter (Signed)
Copied from [place] [id number]. Topic: General - Other >> May 18, 2019 11:06 AM Keene Breath wrote: Reason for CRM: Patient would like the nurse to call her regarding her recent stay at the hospital.  She stated that she has some medication changes that she needs to discuss.  CB# [phone]

## 2019-05-18 NOTE — Telephone Encounter (Signed)
Scheduled patient for a telephone visit hospital follow-up

## 2019-05-18 NOTE — Progress Notes (Signed)
Patient: Megan Shelton Female    DOB: 1940-07-26   79 y.o.   MRN: [id number] Visit Date: 05/18/2019  Today's Provider: Mar Daring, PA-C   Chief Complaint  Patient presents with  . Hospitalization Follow-up   Subjective:     Virtual Visit via Telephone Note  I connected with [name] on 05/18/19 at  1:20 PM EST by telephone and verified that I am speaking with the correct person using two identifiers.  Location: Patient: Home Provider: [facility]   I discussed the limitations, risks, security and privacy concerns of performing an evaluation and management service by telephone and the availability of in person appointments. I also discussed with the patient that there may be a patient responsible charge related to this service. The patient expressed understanding and agreed to proceed.   HPI   Follow up Hospitalization  Patient was admitted to [facility] on 05/11/2019 and discharged on 05/14/2019. She was treated for Atrial Fibrillation with rapid ventricular rate. She had been admitted to undergo bronchoscopy with biopsy but procedure was aborted due to patient going into Afib with RVR.  Treatment for this included Cardizem drip for rate control, Heparin drip and converted over to Eliquis. Cardiology recommended increasing the Metoprolol to 50 mg twice a day. She also underwent cardioversion on 05/12/2019 successfully and had maintained NSR through stay. Patient had a port placement by Dr. Lucky Cowboy, vascular surgery, on 05/14/2019.  Because the patient is on steroids with brain mets the patient was started on Lantus insulin and sliding scale at the hospital.  The patient did not want to go home on insulin. Her glimepiride was increased to 2 mg twice a day.  This medication can be titrated down once steroids are tapered.  Continue Glucophage and Januvia.  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  She is also scheduled for a radiation oncology follow up this afternoon with Dr. Baruch Gouty.   Telephone follow up was not done. She reports excellent compliance with treatment. She reports this condition is Unchanged.  Patient had called the office that she want's to go over the medication changes while she was at the hospital. ------------------------------------------------------------------------------------      Allergies  Allergen Reactions  . Latex Rash  . Nickel Rash     Current Outpatient Medications:  .  albuterol (VENTOLIN HFA) 108 (90 Base) MCG/ACT inhaler, Inhale 1 puff into the lungs every 6 (six) hours as needed for wheezing or shortness of breath., Disp: 18 g, Rfl: 8 .  apixaban (ELIQUIS) 5 MG TABS tablet, Take 1 tablet (5 mg total) by mouth 2 (two) times daily., Disp: 60 tablet, Rfl: 0 .  atorvastatin (LIPITOR) 40 MG tablet, TAKE 1 TABLET BY MOUTH  DAILY (Patient taking differently: Take 40 mg by mouth daily. ), Disp: 90 tablet, Rfl: 3 .  Blood Glucose Monitoring Suppl (ACCU-CHEK COMPACT CARE KIT) KIT, To check blood sugar daily., Disp: 1 kit, Rfl: 0 .  Blood Glucose Monitoring Suppl (ACCU-CHEK GUIDE) w/Device KIT, , Disp: , Rfl:  .  clonazePAM (KLONOPIN) 1 MG tablet, TAKE 1 TABLET BY MOUTH 3 TIMES A DAY AS NEEDED FOR ANXIETY (Patient taking differently: Take 1 mg by mouth 3 (three) times daily as needed for anxiety. ), Disp: 90 tablet, Rfl: 5 .  dexamethasone (DECADRON) 4 MG tablet, Take 1 tablet (4 mg total) by mouth 2 (two) times daily  with a meal., Disp: 60 tablet, Rfl: 0 .  glimepiride (AMARYL) 2 MG tablet, Take 1 tablet (2 mg total) by mouth 2 (two) times daily., Disp: 60 tablet, Rfl: 0 .  glucose blood (ACCU-CHEK COMPACT PLUS) test strip, To check blood sugar daily., Disp: 100 each, Rfl: 12 .  hydrocortisone 1 % ointment, Apply 1 application topically 2 (two) times daily. May use for skin irritation on bilateral eyelids. Do not get  inside of eyes. (Patient taking differently: Apply 1 application topically 2 (two) times daily as needed for itching. May use for skin irritation on bilateral eyelids. Do not get inside of eyes.), Disp: 30 g, Rfl: 0 .  Lancets (ACCU-CHEK SOFT TOUCH) lancets, To check blood sugar daily., Disp: 100 each, Rfl: 12 .  levothyroxine (SYNTHROID) 100 MCG tablet, Take 1 tablet (100 mcg total) by mouth daily at 6 (six) AM., Disp: 30 tablet, Rfl: 0 .  lisinopril (ZESTRIL) 10 MG tablet, TAKE 1 TABLET BY MOUTH  DAILY (Patient taking differently: Take 10 mg by mouth daily. ), Disp: 90 tablet, Rfl: 3 .  metFORMIN (GLUCOPHAGE-XR) 500 MG 24 hr tablet, Take 1 tablet (500 mg total) by mouth 2 (two) times daily., Disp: , Rfl:  .  metoprolol tartrate (LOPRESSOR) 50 MG tablet, Take 1 tablet (50 mg total) by mouth 2 (two) times daily., Disp: 60 tablet, Rfl: 0 .  ondansetron (ZOFRAN ODT) 4 MG disintegrating tablet, Take 1 tablet (4 mg total) by mouth every 8 (eight) hours as needed for nausea or vomiting., Disp: 60 tablet, Rfl: 1 .  ONETOUCH DELICA LANCETS 94R MISC, CHECK BLOOD SUGAR ONCE  DAILY, Disp: 100 each, Rfl: 11 .  oxyCODONE (OXY IR/ROXICODONE) 5 MG immediate release tablet, Take 1 tablet (5 mg total) by mouth every 4 (four) hours as needed for severe pain., Disp: 60 tablet, Rfl: 0 .  pyridOXINE (VITAMIN B-6) 100 MG tablet, Take 100 mg by mouth daily., Disp: , Rfl:  .  sitaGLIPtin (JANUVIA) 100 MG tablet, Take 100 mg by mouth daily., Disp: , Rfl:  .  vitamin B-12 (CYANOCOBALAMIN) 100 MCG tablet, Take 100 mcg by mouth daily., Disp: , Rfl:  .  furosemide (LASIX) 40 MG tablet, Take 1 tablet (40 mg total) by mouth daily as needed for fluid. (Patient not taking: Reported on 05/18/2019), Disp: , Rfl:   Review of Systems  Constitutional: Positive for fatigue.  Respiratory: Negative for cough, chest tightness, shortness of breath and wheezing.   Cardiovascular: Negative for chest pain, palpitations and leg swelling.    Gastrointestinal: Negative for abdominal pain, constipation, diarrhea and nausea.  Musculoskeletal: Positive for arthralgias and myalgias.  Neurological: Positive for weakness.    Social History   Tobacco Use  . Smoking status: Former Smoker    Packs/day: 1.00    Years: 55.00    Pack years: 55.00    Types: Cigarettes    Quit date: 2006    Years since quitting: 15.1  . Smokeless tobacco: Never Used  . Tobacco comment: quit about 18 years ago; as of 2019  Substance Use Topics  . Alcohol use: No      Objective:   There were no vitals taken for this visit. There were no vitals filed for this visit.There is no height or weight on file to calculate BMI.   Physical Exam Vitals reviewed.  Constitutional:      General: She is not in acute distress. Pulmonary:     Effort: No respiratory distress.  Neurological:  Mental Status: She is alert.      No results found for any visits on 05/18/19.     Assessment & Plan     1. Diabetes mellitus without complication ([place]) Continue Glimepiride 20m until decadron is able to be titrated down and then will consider decreasing glimepiride. Also continue metformin.  - glimepiride (AMARYL) 2 MG tablet; Take 1 tablet (2 mg total) by mouth 2 (two) times daily.  Dispense: 180 tablet; Refill: 1 - metFORMIN (GLUCOPHAGE-XR) 500 MG 24 hr tablet; Take 1 tablet (500 mg total) by mouth 2 (two) times daily.  Dispense: 180 tablet; Refill: 1  2. Essential hypertension Stable. Continue Lisinopril 160mdaily, metoprolol 5090mID.  3. Congestive heart failure, unspecified HF chronicity, unspecified heart failure type (HCCMaltatable. Diagnosis pulled for medication refill. Continue current medical treatment plan. - furosemide (LASIX) 40 MG tablet; Take 1 tablet (40 mg total) by mouth daily as needed for fluid.  Dispense: 90 tablet; Refill: 1  4. Atrial fibrillation with RVR (HCC) Stable. Diagnosis pulled for medication refill. Continue current  medical treatment plan. Follows up with Cardiology in 07/2019. - apixaban (ELIQUIS) 5 MG TABS tablet; Take 1 tablet (5 mg total) by mouth 2 (two) times daily.  Dispense: 180 tablet; Refill: 1 - metoprolol tartrate (LOPRESSOR) 50 MG tablet; Take 1 tablet (50 mg total) by mouth 2 (two) times daily.  Dispense: 180 tablet; Refill: 1  5. Stage 4 malignant neoplasm of lung, unspecified laterality (HCCAitkinuspected lung cancer with bony and brain mets. Port placed by Dr. DewLucky Cowboy 05/14/19. Followed by Oncology, Dr. RaoJanese Banksnd Radiation Oncology, Dr. ChrBaruch Gouty6. Hypothyroidism due to acquired atrophy of thyroid Stable. Diagnosis pulled for medication refill. Continue current medical treatment plan. - levothyroxine (SYNTHROID) 100 MCG tablet; Take 1 tablet (100 mcg total) by mouth daily at 6 (six) AM.  Dispense: 90 tablet; Refill: 1   I discussed the assessment and treatment plan with the patient. The patient was provided an opportunity to ask questions and all were answered. The patient agreed with the plan and demonstrated an understanding of the instructions.   The patient was advised to call back or seek an in-person evaluation if the symptoms worsen or if the condition fails to improve as anticipated.  I provided 17 minutes of non-face-to-face time during this encounter.    JenMar DaringA-C  [facility]

## 2019-05-19 ENCOUNTER — Ambulatory Visit: Payer: Medicare Other | Admitting: Oncology

## 2019-05-19 ENCOUNTER — Other Ambulatory Visit: Payer: Self-pay

## 2019-05-19 ENCOUNTER — Ambulatory Visit: Payer: Medicare Other

## 2019-05-19 ENCOUNTER — Ambulatory Visit
Admission: RE | Admit: 2019-05-19 | Discharge: 2019-05-19 | Disposition: A | Discharge status: Home / Self Care | Payer: Medicare Other | Source: Ambulatory Visit | Attending: Radiation Oncology | Admitting: Radiation Oncology

## 2019-05-19 ENCOUNTER — Other Ambulatory Visit: Payer: Medicare Other

## 2019-05-19 DIAGNOSIS — C3412 Malignant neoplasm of upper lobe, left bronchus or lung: Secondary | ICD-10-CM | POA: Diagnosis not present

## 2019-05-19 DIAGNOSIS — Z510 Encounter for antineoplastic radiation therapy: Secondary | ICD-10-CM | POA: Diagnosis not present

## 2019-05-19 DIAGNOSIS — C7931 Secondary malignant neoplasm of brain: Secondary | ICD-10-CM | POA: Diagnosis not present

## 2019-05-19 DIAGNOSIS — C7951 Secondary malignant neoplasm of bone: Secondary | ICD-10-CM | POA: Diagnosis not present

## 2019-05-20 ENCOUNTER — Ambulatory Visit
Admission: RE | Admit: 2019-05-20 | Discharge: 2019-05-20 | Disposition: A | Discharge status: Home / Self Care | Payer: Medicare Other | Source: Ambulatory Visit | Attending: Radiation Oncology | Admitting: Radiation Oncology

## 2019-05-20 ENCOUNTER — Other Ambulatory Visit: Payer: Self-pay

## 2019-05-20 DIAGNOSIS — C7951 Secondary malignant neoplasm of bone: Secondary | ICD-10-CM | POA: Diagnosis not present

## 2019-05-20 DIAGNOSIS — Z510 Encounter for antineoplastic radiation therapy: Secondary | ICD-10-CM | POA: Diagnosis not present

## 2019-05-20 DIAGNOSIS — C3412 Malignant neoplasm of upper lobe, left bronchus or lung: Secondary | ICD-10-CM | POA: Diagnosis not present

## 2019-05-20 DIAGNOSIS — C7931 Secondary malignant neoplasm of brain: Secondary | ICD-10-CM | POA: Diagnosis not present

## 2019-05-21 ENCOUNTER — Other Ambulatory Visit: Payer: Self-pay | Admitting: Oncology

## 2019-05-21 ENCOUNTER — Other Ambulatory Visit: Payer: Self-pay

## 2019-05-21 ENCOUNTER — Other Ambulatory Visit: Payer: Medicare Other

## 2019-05-21 ENCOUNTER — Ambulatory Visit: Payer: Medicare Other

## 2019-05-21 DIAGNOSIS — Z510 Encounter for antineoplastic radiation therapy: Secondary | ICD-10-CM | POA: Diagnosis not present

## 2019-05-21 DIAGNOSIS — C7951 Secondary malignant neoplasm of bone: Secondary | ICD-10-CM | POA: Diagnosis not present

## 2019-05-21 DIAGNOSIS — C3490 Malignant neoplasm of unspecified part of unspecified bronchus or lung: Secondary | ICD-10-CM

## 2019-05-21 MED ORDER — LORAZEPAM 0.5 MG PO TABS: 0.5000 mg | ORAL_TABLET | Freq: Four times a day (QID) | ORAL | 0 refills | Status: DC | PRN

## 2019-05-21 MED ORDER — ONDANSETRON HCL 8 MG PO TABS: 8.0000 mg | ORAL_TABLET | Freq: Two times a day (BID) | ORAL | 1 refills | Status: DC | PRN

## 2019-05-21 MED ORDER — DEXAMETHASONE 4 MG PO TABS: ORAL_TABLET | ORAL | 1 refills | Status: DC

## 2019-05-21 MED ORDER — PROCHLORPERAZINE MALEATE 10 MG PO TABS: 10.0000 mg | ORAL_TABLET | Freq: Four times a day (QID) | ORAL | 1 refills | Status: DC | PRN

## 2019-05-21 MED ORDER — LIDOCAINE-PRILOCAINE 2.5-2.5 % EX CREA: TOPICAL_CREAM | CUTANEOUS | 3 refills | Status: DC

## 2019-05-21 MED ORDER — FOLIC ACID 1 MG PO TABS: 1.0000 mg | ORAL_TABLET | Freq: Every day | ORAL | 3 refills | Status: DC

## 2019-05-21 NOTE — Progress Notes (Signed)
START OFF PATHWAY REGIMEN - Non-Small Cell Lung   [id number]:Pembrolizumab 200 mg  IV D1 + Pemetrexed 500 mg/m2 IV D1 + Carboplatin AUC=5 IV D1 q21 Days:   A cycle is every 21 days:     Pembrolizumab      Pemetrexed      Carboplatin   **Always confirm dose/schedule in your pharmacy ordering system**  Patient Characteristics: Stage IV Metastatic, Nonsquamous, Initial Chemotherapy/Immunotherapy, PS = 0, 1, ALK or EGFR or ROS1 or NTRK or MET or RET Genomic Alterations - Awaiting Test Results AJCC T Category: T3 Current Disease Status: Distant Metastases AJCC N Category: NX AJCC M Category: M1c AJCC 8 Stage Grouping: IVB Histology: Nonsquamous Cell ROS1 Rearrangement Status: Awaiting Test Results [id number] Mutation Status: Not Applicable - EGFR Mutation Negative/Unknown Other Mutations/Biomarkers: No Other Actionable Mutations Chemotherapy/Immunotherapy LOT: Initial Chemotherapy/Immunotherapy Molecular Targeted Therapy: Not Appropriate MET Exon 14 Mutation Status: Awaiting Test Results RET Gene Fusion Status: Awaiting Test Results EGFR Mutation Status: Awaiting Test Results NTRK Gene Fusion Status: Awaiting Test Results PD-L1 Expression Status: Awaiting Test Results ALK Rearrangement Status: Awaiting Test Results BRAF V600E Mutation Status: Awaiting Test Results ECOG Performance Status: 1 Biomarker Assessment Status Confirmation: Awaiting genomic biomarker test(s) results and need to start chemotherapy Intent of Therapy: Non-Curative / Palliative Intent, Discussed with Patient

## 2019-05-21 NOTE — Progress Notes (Signed)
Tumor Board Documentation  Adiana Smelcer was presented by Dr Janese Banks at our Tumor Board on 05/21/2019, which included representatives from medical oncology, radiation oncology, navigation, pathology, radiology, surgical, surgical oncology, internal medicine, pharmacy, genetics, palliative care, research.  Holy currently presents as a new patient, for [place], for new positive pathology with history of the following treatments: active survellience, surgical intervention(s), neoadjuvant radiation.  Additionally, we reviewed previous medical and familial history, history of present illness, and recent lab results along with all available histopathologic and imaging studies. The tumor board considered available treatment options and made the following recommendations: Chemotherapy, Immunotherapy    The following procedures/referrals were also placed: No orders of the defined types were placed in this encounter.   Clinical Trial Status: not discussed   Staging used: Pathologic Stage  AJCC Staging:       Group: Lung [place] with Brain Mets   National site-specific guidelines NCCN were discussed with respect to the case.  Tumor board is a meeting of clinicians from various specialty areas who evaluate and discuss patients for whom a multidisciplinary approach is being considered. Final determinations in the plan of care are those of the provider(s). The responsibility for follow up of recommendations given during tumor board is that of the provider.   Today's extended care, comprehensive team conference, [place] was not present for the discussion and was not examined.   Multidisciplinary Tumor Board is a multidisciplinary case peer review process.  Decisions discussed in the Multidisciplinary Tumor Board reflect the opinions of the specialists present at the conference without having examined the patient.  Ultimately, treatment and diagnostic decisions rest with the primary provider(s) and the  patient.

## 2019-05-22 ENCOUNTER — Ambulatory Visit
Admission: RE | Admit: 2019-05-22 | Discharge: 2019-05-22 | Disposition: A | Discharge status: Home / Self Care | Payer: Medicare Other | Source: Ambulatory Visit | Attending: Radiation Oncology | Admitting: Radiation Oncology

## 2019-05-22 ENCOUNTER — Other Ambulatory Visit: Payer: Self-pay

## 2019-05-22 ENCOUNTER — Telehealth: Payer: Self-pay | Admitting: *Deleted

## 2019-05-22 DIAGNOSIS — C3412 Malignant neoplasm of upper lobe, left bronchus or lung: Secondary | ICD-10-CM | POA: Diagnosis not present

## 2019-05-22 DIAGNOSIS — Z510 Encounter for antineoplastic radiation therapy: Secondary | ICD-10-CM | POA: Diagnosis not present

## 2019-05-22 DIAGNOSIS — C7931 Secondary malignant neoplasm of brain: Secondary | ICD-10-CM | POA: Diagnosis not present

## 2019-05-22 DIAGNOSIS — C7951 Secondary malignant neoplasm of bone: Secondary | ICD-10-CM | POA: Diagnosis not present

## 2019-05-22 NOTE — Telephone Encounter (Signed)
I called pharmacy per request of Dr. Janese Banks, she states that she released premed to start chemo but she did not want pt to have lorazepam due to she is already on clonzepam. Called pharmacy and spoke to [place] and pt has not picked up premeds to start chemo next Friday and he will take it off the list and pull it out of bad for her.

## 2019-05-22 NOTE — Patient Instructions (Signed)
Pembrolizumab injection What is this medicine? PEMBROLIZUMAB (pem broe liz ue mab) is a monoclonal antibody. It is used to treat certain types of cancer. This medicine may be used for other purposes; ask your health care provider or pharmacist if you have questions. COMMON BRAND NAME(S): Keytruda What should I tell my health care provider before I take this medicine? They need to know if you have any of these conditions:  diabetes  immune system problems  inflammatory bowel disease  liver disease  lung or breathing disease  lupus  received or scheduled to receive an organ transplant or a stem-cell transplant that uses donor stem cells  an unusual or allergic reaction to pembrolizumab, other medicines, foods, dyes, or preservatives  pregnant or trying to get pregnant  breast-feeding How should I use this medicine? This medicine is for infusion into a vein. It is given by a health care professional in a hospital or clinic setting. A special MedGuide will be given to you before each treatment. Be sure to read this information carefully each time. Talk to your pediatrician regarding the use of this medicine in children. While this drug may be prescribed for children as young as 6 months for selected conditions, precautions do apply. Overdosage: If you think you have taken too much of this medicine contact a poison control center or emergency room at once. NOTE: This medicine is only for you. Do not share this medicine with others. What if I miss a dose? It is important not to miss your dose. Call your doctor or health care professional if you are unable to keep an appointment. What may interact with this medicine? Interactions have not been studied. Give your health care provider a list of all the medicines, herbs, non-prescription drugs, or dietary supplements you use. Also tell them if you smoke, drink alcohol, or use illegal drugs. Some items may interact with your medicine. This  list may not describe all possible interactions. Give your health care provider a list of all the medicines, herbs, non-prescription drugs, or dietary supplements you use. Also tell them if you smoke, drink alcohol, or use illegal drugs. Some items may interact with your medicine. What should I watch for while using this medicine? Your condition will be monitored carefully while you are receiving this medicine. You may need blood work done while you are taking this medicine. Do not become pregnant while taking this medicine or for 4 months after stopping it. Women should inform their doctor if they wish to become pregnant or think they might be pregnant. There is a potential for serious side effects to an unborn child. Talk to your health care professional or pharmacist for more information. Do not breast-feed an infant while taking this medicine or for 4 months after the last dose. What side effects may I notice from receiving this medicine? Side effects that you should report to your doctor or health care professional as soon as possible:  allergic reactions like skin rash, itching or hives, swelling of the face, lips, or tongue  bloody or black, tarry  breathing problems  changes in vision  chest pain  chills  confusion  constipation  cough  diarrhea  dizziness or feeling faint or lightheaded  fast or irregular heartbeat  fever  flushing  joint pain  low blood counts - this medicine may decrease the number of white blood cells, red blood cells and platelets. You may be at increased risk for infections and bleeding.  muscle pain  muscle  weakness  pain, tingling, numbness in the hands or feet  persistent headache  redness, blistering, peeling or loosening of the skin, including inside the mouth  signs and symptoms of high blood sugar such as dizziness; dry mouth; dry skin; fruity breath; nausea; stomach pain; increased hunger or thirst; increased urination  signs  and symptoms of kidney injury like trouble passing urine or change in the amount of urine  signs and symptoms of liver injury like dark urine, light-colored stools, loss of appetite, nausea, right upper belly pain, yellowing of the eyes or skin  sweating  swollen lymph nodes  weight loss Side effects that usually do not require medical attention (report to your doctor or health care professional if they continue or are bothersome):  decreased appetite  hair loss  muscle pain  tiredness This list may not describe all possible side effects. Call your doctor for medical advice about side effects. You may report side effects to FDA at 1-800-FDA-1088. Where should I keep my medicine? This drug is given in a hospital or clinic and will not be stored at home. NOTE: This sheet is a summary. It may not cover all possible information. If you have questions about this medicine, talk to your doctor, pharmacist, or health care provider.  2020 Elsevier/Gold Standard (2019-01-30 18:07:58) Pemetrexed injection What is this medicine? PEMETREXED (PEM e TREX ed) is a chemotherapy drug used to treat lung cancers like non-small cell lung cancer and mesothelioma. It may also be used to treat other cancers. This medicine may be used for other purposes; ask your health care provider or pharmacist if you have questions. COMMON BRAND NAME(S): Alimta What should I tell my health care provider before I take this medicine? They need to know if you have any of these conditions:  infection (especially a virus infection such as chickenpox, cold sores, or herpes)  kidney disease  low blood counts, like low white cell, platelet, or red cell counts  lung or breathing disease, like asthma  radiation therapy  an unusual or allergic reaction to pemetrexed, other medicines, foods, dyes, or preservative  pregnant or trying to get pregnant  breast-feeding How should I use this medicine? This drug is given as  an infusion into a vein. It is administered in a hospital or clinic by a specially trained health care professional. Talk to your pediatrician regarding the use of this medicine in children. Special care may be needed. Overdosage: If you think you have taken too much of this medicine contact a poison control center or emergency room at once. NOTE: This medicine is only for you. Do not share this medicine with others. What if I miss a dose? It is important not to miss your dose. Call your doctor or health care professional if you are unable to keep an appointment. What may interact with this medicine? This medicine may interact with the following medications:  Ibuprofen This list may not describe all possible interactions. Give your health care provider a list of all the medicines, herbs, non-prescription drugs, or dietary supplements you use. Also tell them if you smoke, drink alcohol, or use illegal drugs. Some items may interact with your medicine. What should I watch for while using this medicine? Visit your doctor for checks on your progress. This drug may make you feel generally unwell. This is not uncommon, as chemotherapy can affect healthy cells as well as cancer cells. Report any side effects. Continue your course of treatment even though you feel ill unless  your doctor tells you to stop. In some cases, you may be given additional medicines to help with side effects. Follow all directions for their use. Call your doctor or health care professional for advice if you get a fever, chills or sore throat, or other symptoms of a cold or flu. Do not treat yourself. This drug decreases your body's ability to fight infections. Try to avoid being around people who are sick. This medicine may increase your risk to bruise or bleed. Call your doctor or health care professional if you notice any unusual bleeding. Be careful brushing and flossing your teeth or using a toothpick because you may get an  infection or bleed more easily. If you have any dental work done, tell your dentist you are receiving this medicine. Avoid taking products that contain aspirin, acetaminophen, ibuprofen, naproxen, or ketoprofen unless instructed by your doctor. These medicines may hide a fever. Call your doctor or health care professional if you get diarrhea or mouth sores. Do not treat yourself. To protect your kidneys, drink water or other fluids as directed while you are taking this medicine. Do not become pregnant while taking this medicine or for 6 months after stopping it. Women should inform their doctor if they wish to become pregnant or think they might be pregnant. Men should not father a child while taking this medicine and for 3 months after stopping it. This may interfere with the ability to father a child. You should talk to your doctor or health care professional if you are concerned about your fertility. There is a potential for serious side effects to an unborn child. Talk to your health care professional or pharmacist for more information. Do not breast-feed an infant while taking this medicine or for 1 week after stopping it. What side effects may I notice from receiving this medicine? Side effects that you should report to your doctor or health care professional as soon as possible:  allergic reactions like skin rash, itching or hives, swelling of the face, lips, or tongue  breathing problems  redness, blistering, peeling or loosening of the skin, including inside the mouth  signs and symptoms of bleeding such as bloody or black, tarry stools; red or dark-brown urine; spitting up blood or brown material that looks like coffee grounds; red spots on the skin; unusual bruising or bleeding from the eye, gums, or nose  signs and symptoms of infection like fever or chills; cough; sore throat; pain or trouble passing urine  signs and symptoms of kidney injury like trouble passing urine or change in the  amount of urine  signs and symptoms of liver injury like dark yellow or brown urine; general ill feeling or flu-like symptoms; light-colored stools; loss of appetite; nausea; right upper belly pain; unusually weak or tired; yellowing of the eyes or skin Side effects that usually do not require medical attention (report to your doctor or health care professional if they continue or are bothersome):  constipation  mouth sores  nausea, vomiting  unusually weak or tired This list may not describe all possible side effects. Call your doctor for medical advice about side effects. You may report side effects to FDA at 1-800-FDA-1088. Where should I keep my medicine? This drug is given in a hospital or clinic and will not be stored at home. NOTE: This sheet is a summary. It may not cover all possible information. If you have questions about this medicine, talk to your doctor, pharmacist, or health care provider.  2020  Elsevier/Gold Standard (2017-05-15 16:11:33) Pemetrexed injection What is this medicine? PEMETREXED (PEM e TREX ed) is a chemotherapy drug used to treat lung cancers like non-small cell lung cancer and mesothelioma. It may also be used to treat other cancers. This medicine may be used for other purposes; ask your health care provider or pharmacist if you have questions. COMMON BRAND NAME(S): Alimta What should I tell my health care provider before I take this medicine? They need to know if you have any of these conditions:  infection (especially a virus infection such as chickenpox, cold sores, or herpes)  kidney disease  low blood counts, like low white cell, platelet, or red cell counts  lung or breathing disease, like asthma  radiation therapy  an unusual or allergic reaction to pemetrexed, other medicines, foods, dyes, or preservative  pregnant or trying to get pregnant  breast-feeding How should I use this medicine? This drug is given as an infusion into a vein.  It is administered in a hospital or clinic by a specially trained health care professional. Talk to your pediatrician regarding the use of this medicine in children. Special care may be needed. Overdosage: If you think you have taken too much of this medicine contact a poison control center or emergency room at once. NOTE: This medicine is only for you. Do not share this medicine with others. What if I miss a dose? It is important not to miss your dose. Call your doctor or health care professional if you are unable to keep an appointment. What may interact with this medicine? This medicine may interact with the following medications:  Ibuprofen This list may not describe all possible interactions. Give your health care provider a list of all the medicines, herbs, non-prescription drugs, or dietary supplements you use. Also tell them if you smoke, drink alcohol, or use illegal drugs. Some items may interact with your medicine. What should I watch for while using this medicine? Visit your doctor for checks on your progress. This drug may make you feel generally unwell. This is not uncommon, as chemotherapy can affect healthy cells as well as cancer cells. Report any side effects. Continue your course of treatment even though you feel ill unless your doctor tells you to stop. In some cases, you may be given additional medicines to help with side effects. Follow all directions for their use. Call your doctor or health care professional for advice if you get a fever, chills or sore throat, or other symptoms of a cold or flu. Do not treat yourself. This drug decreases your body's ability to fight infections. Try to avoid being around people who are sick. This medicine may increase your risk to bruise or bleed. Call your doctor or health care professional if you notice any unusual bleeding. Be careful brushing and flossing your teeth or using a toothpick because you may get an infection or bleed more  easily. If you have any dental work done, tell your dentist you are receiving this medicine. Avoid taking products that contain aspirin, acetaminophen, ibuprofen, naproxen, or ketoprofen unless instructed by your doctor. These medicines may hide a fever. Call your doctor or health care professional if you get diarrhea or mouth sores. Do not treat yourself. To protect your kidneys, drink water or other fluids as directed while you are taking this medicine. Do not become pregnant while taking this medicine or for 6 months after stopping it. Women should inform their doctor if they wish to become pregnant or think they  might be pregnant. Men should not father a child while taking this medicine and for 3 months after stopping it. This may interfere with the ability to father a child. You should talk to your doctor or health care professional if you are concerned about your fertility. There is a potential for serious side effects to an unborn child. Talk to your health care professional or pharmacist for more information. Do not breast-feed an infant while taking this medicine or for 1 week after stopping it. What side effects may I notice from receiving this medicine? Side effects that you should report to your doctor or health care professional as soon as possible:  allergic reactions like skin rash, itching or hives, swelling of the face, lips, or tongue  breathing problems  redness, blistering, peeling or loosening of the skin, including inside the mouth  signs and symptoms of bleeding such as bloody or black, tarry stools; red or dark-brown urine; spitting up blood or brown material that looks like coffee grounds; red spots on the skin; unusual bruising or bleeding from the eye, gums, or nose  signs and symptoms of infection like fever or chills; cough; sore throat; pain or trouble passing urine  signs and symptoms of kidney injury like trouble passing urine or change in the amount of  urine  signs and symptoms of liver injury like dark yellow or brown urine; general ill feeling or flu-like symptoms; light-colored stools; loss of appetite; nausea; right upper belly pain; unusually weak or tired; yellowing of the eyes or skin Side effects that usually do not require medical attention (report to your doctor or health care professional if they continue or are bothersome):  constipation  mouth sores  nausea, vomiting  unusually weak or tired This list may not describe all possible side effects. Call your doctor for medical advice about side effects. You may report side effects to FDA at 1-800-FDA-1088. Where should I keep my medicine? This drug is given in a hospital or clinic and will not be stored at home. NOTE: This sheet is a summary. It may not cover all possible information. If you have questions about this medicine, talk to your doctor, pharmacist, or health care provider.  2020 Elsevier/Gold Standard (2017-05-15 16:11:33) Carboplatin injection What is this medicine? CARBOPLATIN (KAR boe pla tin) is a chemotherapy drug. It targets fast dividing cells, like cancer cells, and causes these cells to die. This medicine is used to treat ovarian cancer and many other cancers. This medicine may be used for other purposes; ask your health care provider or pharmacist if you have questions. COMMON BRAND NAME(S): Paraplatin What should I tell my health care provider before I take this medicine? They need to know if you have any of these conditions:  blood disorders  hearing problems  kidney disease  recent or ongoing radiation therapy  an unusual or allergic reaction to carboplatin, cisplatin, other chemotherapy, other medicines, foods, dyes, or preservatives  pregnant or trying to get pregnant  breast-feeding How should I use this medicine? This drug is usually given as an infusion into a vein. It is administered in a hospital or clinic by a specially trained health  care professional. Talk to your pediatrician regarding the use of this medicine in children. Special care may be needed. Overdosage: If you think you have taken too much of this medicine contact a poison control center or emergency room at once. NOTE: This medicine is only for you. Do not share this medicine with others. What  if I miss a dose? It is important not to miss a dose. Call your doctor or health care professional if you are unable to keep an appointment. What may interact with this medicine?  medicines for seizures  medicines to increase blood counts like filgrastim, pegfilgrastim, sargramostim  some antibiotics like amikacin, gentamicin, neomycin, streptomycin, tobramycin  vaccines Talk to your doctor or health care professional before taking any of these medicines:  acetaminophen  aspirin  ibuprofen  ketoprofen  naproxen This list may not describe all possible interactions. Give your health care provider a list of all the medicines, herbs, non-prescription drugs, or dietary supplements you use. Also tell them if you smoke, drink alcohol, or use illegal drugs. Some items may interact with your medicine. What should I watch for while using this medicine? Your condition will be monitored carefully while you are receiving this medicine. You will need important blood work done while you are taking this medicine. This drug may make you feel generally unwell. This is not uncommon, as chemotherapy can affect healthy cells as well as cancer cells. Report any side effects. Continue your course of treatment even though you feel ill unless your doctor tells you to stop. In some cases, you may be given additional medicines to help with side effects. Follow all directions for their use. Call your doctor or health care professional for advice if you get a fever, chills or sore throat, or other symptoms of a cold or flu. Do not treat yourself. This drug decreases your body's ability to  fight infections. Try to avoid being around people who are sick. This medicine may increase your risk to bruise or bleed. Call your doctor or health care professional if you notice any unusual bleeding. Be careful brushing and flossing your teeth or using a toothpick because you may get an infection or bleed more easily. If you have any dental work done, tell your dentist you are receiving this medicine. Avoid taking products that contain aspirin, acetaminophen, ibuprofen, naproxen, or ketoprofen unless instructed by your doctor. These medicines may hide a fever. Do not become pregnant while taking this medicine. Women should inform their doctor if they wish to become pregnant or think they might be pregnant. There is a potential for serious side effects to an unborn child. Talk to your health care professional or pharmacist for more information. Do not breast-feed an infant while taking this medicine. What side effects may I notice from receiving this medicine? Side effects that you should report to your doctor or health care professional as soon as possible:  allergic reactions like skin rash, itching or hives, swelling of the face, lips, or tongue  signs of infection - fever or chills, cough, sore throat, pain or difficulty passing urine  signs of decreased platelets or bleeding - bruising, pinpoint red spots on the skin, black, tarry stools, nosebleeds  signs of decreased red blood cells - unusually weak or tired, fainting spells, lightheadedness  breathing problems  changes in hearing  changes in vision  chest pain  high blood pressure  low blood counts - This drug may decrease the number of white blood cells, red blood cells and platelets. You may be at increased risk for infections and bleeding.  nausea and vomiting  pain, swelling, redness or irritation at the injection site  pain, tingling, numbness in the hands or feet  problems with balance, talking, walking  trouble  passing urine or change in the amount of urine Side effects that usually  do not require medical attention (report to your doctor or health care professional if they continue or are bothersome):  hair loss  loss of appetite  metallic taste in the mouth or changes in taste This list may not describe all possible side effects. Call your doctor for medical advice about side effects. You may report side effects to FDA at 1-800-FDA-1088. Where should I keep my medicine? This drug is given in a hospital or clinic and will not be stored at home. NOTE: This sheet is a summary. It may not cover all possible information. If you have questions about this medicine, talk to your doctor, pharmacist, or health care provider.  2020 Elsevier/Gold Standard (2007-07-01 14:38:05)

## 2019-05-23 ENCOUNTER — Other Ambulatory Visit: Payer: Self-pay | Admitting: Physician Assistant

## 2019-05-23 DIAGNOSIS — F411 Generalized anxiety disorder: Secondary | ICD-10-CM

## 2019-05-23 NOTE — Telephone Encounter (Signed)
Requested medication (s) are due for refill today: yes  Requested medication (s) are on the active medication list: yes  Last refill:  11/21/18  Future visit scheduled: no  Notes to clinic:  medication not delegated to [facility] to refill   Requested Prescriptions  Pending Prescriptions Disp Refills   clonazePAM (KLONOPIN) 1 MG tablet [Pharmacy Med Name: CLONAZEPAM 1 MG TABLET] 90 tablet     Sig: TAKE 1 TABLET BY MOUTH THREE TIMES A DAY AS NEEDED FOR ANXIETY      Not Delegated - Psychiatry:  Anxiolytics/Hypnotics Failed - 05/23/2019 11:41 AM      Failed - This refill cannot be delegated      Failed - Urine Drug Screen completed in last 360 days.      Passed - Valid encounter within last 6 months    Recent Outpatient Visits           5 days ago Essential hypertension   [place], Clearnce Sorrel, [place]   2 months ago Eyelid dermatitis, allergic/contact- bilateral    [place] [facility], Kelby Aline, FNP   6 months ago Annual physical exam   [facility], Clearnce Sorrel, [place]   1 year ago Essential hypertension   [place], Clearnce Sorrel, [place]   1 year ago Annual physical exam   [place], Clearnce Sorrel, [place]       Future Appointments             In 1 month End, Harrell Gave, MD [facility], [place]

## 2019-05-25 ENCOUNTER — Ambulatory Visit
Admission: RE | Admit: 2019-05-25 | Discharge: 2019-05-25 | Disposition: A | Discharge status: Home / Self Care | Payer: Medicare Other | Source: Ambulatory Visit | Attending: Radiation Oncology | Admitting: Radiation Oncology

## 2019-05-25 ENCOUNTER — Other Ambulatory Visit: Payer: Self-pay

## 2019-05-25 ENCOUNTER — Inpatient Hospital Stay: Payer: Medicare Other | Attending: Oncology

## 2019-05-25 ENCOUNTER — Inpatient Hospital Stay (HOSPITAL_BASED_OUTPATIENT_CLINIC_OR_DEPARTMENT_OTHER): Payer: Medicare Other | Admitting: Oncology

## 2019-05-25 DIAGNOSIS — C7951 Secondary malignant neoplasm of bone: Secondary | ICD-10-CM | POA: Insufficient documentation

## 2019-05-25 DIAGNOSIS — I130 Hypertensive heart and chronic kidney disease with heart failure and stage 1 through stage 4 chronic kidney disease, or unspecified chronic kidney disease: Secondary | ICD-10-CM | POA: Insufficient documentation

## 2019-05-25 DIAGNOSIS — I4891 Unspecified atrial fibrillation: Secondary | ICD-10-CM | POA: Insufficient documentation

## 2019-05-25 DIAGNOSIS — Z794 Long term (current) use of insulin: Secondary | ICD-10-CM | POA: Insufficient documentation

## 2019-05-25 DIAGNOSIS — C3490 Malignant neoplasm of unspecified part of unspecified bronchus or lung: Secondary | ICD-10-CM

## 2019-05-25 DIAGNOSIS — E1122 Type 2 diabetes mellitus with diabetic chronic kidney disease: Secondary | ICD-10-CM | POA: Insufficient documentation

## 2019-05-25 DIAGNOSIS — Z79899 Other long term (current) drug therapy: Secondary | ICD-10-CM | POA: Insufficient documentation

## 2019-05-25 DIAGNOSIS — C7931 Secondary malignant neoplasm of brain: Secondary | ICD-10-CM | POA: Insufficient documentation

## 2019-05-25 DIAGNOSIS — N189 Chronic kidney disease, unspecified: Secondary | ICD-10-CM | POA: Insufficient documentation

## 2019-05-25 DIAGNOSIS — Z510 Encounter for antineoplastic radiation therapy: Secondary | ICD-10-CM | POA: Diagnosis not present

## 2019-05-25 NOTE — Progress Notes (Addendum)
[place]  [phone] [phone]  Patient Care Team: Rubye Beach as PCP - General (Family Medicine) Deboraha Sprang, MD as PCP - Cardiology (Cardiology) Telford Nab, RN as Registered Nurse   Name of the patient: Lanijah Warzecha  [id number]  1940-10-04   Date of visit: 05/29/19  Diagnosis- Lung mass/brain and bone mets  I connected with [name] on 06/12/19 at  9:00 AM EST by telephone visit and verified that I am speaking with the correct person using two identifiers.   I discussed the limitations, risks, security and privacy concerns of performing an evaluation and management service by telemedicine and the availability of in-person appointments. I also discussed with the patient that there may be a patient responsible charge related to this service. The patient expressed understanding and agreed to proceed.   Other persons participating in the visit and their role in the encounter: None  Patient's location: Home Provider's location: Office  Chief complaint/Reason for visit- Initial Meeting for [facility], preparing for starting chemotherapy  I connected with [name] on 05/29/19 at  9:00 AM EST by telephone visit and verified that I am speaking with the correct person using two identifiers.   I discussed the limitations, risks, security and privacy concerns of performing an evaluation and management service by telemedicine and the availability of in-person appointments. I also discussed with the patient that there may be a patient responsible charge related to this service. The patient expressed understanding and agreed to proceed.   Other persons participating in the visit and their role in the encounter: Daughter Larena Glassman  Patient's location: Home Provider's location: Office  Heme/Onc history:  Oncology History  Stage 4 malignant neoplasm of lung ([place])  05/11/2019  Initial Diagnosis   Stage 4 malignant neoplasm of lung ([place])   06/04/2019 -  Chemotherapy   The patient had palonosetron (ALOXI) injection 0.25 mg, 0.25 mg, Intravenous,  Once, 0 of 4 cycles PEMEtrexed (ALIMTA) 900 mg in sodium chloride 0.9 % 100 mL chemo infusion, 500 mg/m2, Intravenous,  Once, 0 of 6 cycles CARBOplatin (PARAPLATIN) in sodium chloride 0.9 % 100 mL chemo infusion, , Intravenous,  Once, 0 of 4 cycles fosaprepitant (EMEND) 150 mg in sodium chloride 0.9 % 145 mL IVPB, 150 mg, Intravenous,  Once, 0 of 4 cycles pembrolizumab (KEYTRUDA) 200 mg in sodium chloride 0.9 % 50 mL chemo infusion, 200 mg, Intravenous, Once, 0 of 6 cycles  for chemotherapy treatment.      Interval history-Mrs. Jann is a 79 yo female who presents to chemo care clinic today for initial meeting in preparation for starting chemotherapy. I introduced the chemo care clinic and we discussed that the role of the clinic is to assist those who are at an increased risk of emergency room visits and/or complications during the course of chemotherapy treatment. We discussed that the increased risk takes into account factors such as age, performance status, and co-morbidities. We also discussed that for some, this might include barriers to care such as not having a primary care provider, lack of insurance/transportation, or not being able to afford medications. We discussed that the goal of the program is to help prevent unplanned ER visits and help reduce complications during chemotherapy. We do this by discussing specific risk factors to each individual and identifying ways that we can help improve these risk factors and reduce barriers to care.   Mrs. Bracher was recently admitted to [facility]  ICU on 05/11/2019 and discharged on 05/14/2019.  She was treated for atrial fibrillation with RVR.  While admitted she had a bronchoscopy with biopsy which was prematurely aborted due to A. fib with RVR.  Treatment included Cardizem drip for rate  control, heparin drip which was converted to Eliquis.  Cardiology recommended increasing metoprolol to 50 mg twice a day.  Had cardioversion on 05/12/2019 which was successful.  She maintained NSR throughout her stay.  Had port placed by Dr. Lucky Cowboy on 05/14/2019.  Has history of diabetes and was started on steroids due to brain mets she was also started on Lantus insulin and sliding scale in the hospital.  Patient refused insulin at discharged and her glimepiride was increased to 2 mg twice a day.  She continued Glucophage and Januvia.  ECOG FS:2 - Symptomatic, <50% confined to bed  Review of systems- Review of Systems  Constitutional: Positive for malaise/fatigue. Negative for chills, fever and weight loss.  HENT: Negative for congestion, ear pain and tinnitus.   Eyes: Negative.  Negative for blurred vision and double vision.  Respiratory: Negative.  Negative for cough, sputum production and shortness of breath.   Cardiovascular: Negative.  Negative for chest pain, palpitations and leg swelling.  Gastrointestinal: Negative.  Negative for abdominal pain, constipation, diarrhea, nausea and vomiting.  Genitourinary: Negative for dysuria, frequency and urgency.  Musculoskeletal: Positive for myalgias. Negative for back pain and falls.  Skin: Negative.  Negative for rash.  Neurological: Positive for weakness. Negative for headaches.  Endo/Heme/Allergies: Negative.  Does not bruise/bleed easily.  Psychiatric/Behavioral: Negative.  Negative for depression. The patient is not nervous/anxious and does not have insomnia.     Current treatment-scheduled to begin cycle 1 carbo/Alimta with Keytruda on 06/04/2019  Allergies  Allergen Reactions  . Latex Rash  . Nickel Rash    Past Medical History:  Diagnosis Date  . AICD (automatic cardioverter/defibrillator) present   . Anxiety   . CHF (congestive heart failure) ([place])   . Diabetes mellitus with neuropathy ([place])   . Dyspnea   . Hypertension   .  Hypertensive heart disease   . Implantable cardioverter-defibrillator (ICD)-[employer]   . Myocardial infarction, anterior wall ([place])   . Obesity, Class II, BMI 35-39.9     Past Surgical History:  Procedure Laterality Date  . CARDIOVERSION N/A 05/12/2019   Procedure: CARDIOVERSION;  Surgeon: Nelva Bush, MD;  Location: [facility] ORS;  Service: Cardiovascular;  Laterality: N/A;  . CARPAL TUNNEL RELEASE    . cataract surgery    . EYE SURGERY    . FRACTURE SURGERY Left    ARM  . JOINT REPLACEMENT Left    tkr  . PORTA CATH INSERTION N/A 05/14/2019   Procedure: PORTA CATH INSERTION;  Surgeon: Algernon Huxley, MD;  Location: [facility] [facility];  Service: Cardiovascular;  Laterality: N/A;  . VIDEO BRONCHOSCOPY WITH ENDOBRONCHIAL ULTRASOUND N/A 05/11/2019   Procedure: VIDEO BRONCHOSCOPY WITH ENDOBRONCHIAL ULTRASOUND;  Surgeon: Tyler Pita, MD;  Location: [facility] ORS;  Service: Pulmonary;  Laterality: N/A;    Social History   Socioeconomic History  . Marital status: Widowed    Spouse name: Not on file  . Number of children: 3  . Years of education: Not on file  . Highest education level: High school graduate  Occupational History  . Occupation: retired  Tobacco Use  . Smoking status: Former Smoker    Packs/day: 1.00    Years: 55.00    Pack years: 55.00    Types: Cigarettes  Quit date: 2006    Years since quitting: 15.1  . Smokeless tobacco: Never Used  . Tobacco comment: quit about 18 years ago; as of 2019  Substance and Sexual Activity  . Alcohol use: No  . Drug use: No  . Sexual activity: Not on file  Other Topics Concern  . Not on file  Social History Narrative  . Not on file   Social Determinants of Health   Financial Resource Strain:   . Difficulty of Paying Living Expenses: Not on file  Food Insecurity:   . Worried About Charity fundraiser in the Last Year: Not on file  . Ran Out of Food in the Last Year: Not on file  Transportation Needs:   . Lack of  Transportation (Medical): Not on file  . Lack of Transportation (Non-Medical): Not on file  Physical Activity: Inactive  . Days of Exercise per Week: 0 days  . Minutes of Exercise per Session: 0 min  Stress: No Stress Concern Present  . Feeling of Stress : Not at all  Social Connections: Unknown  . Frequency of Communication with Friends and Family: Patient refused  . Frequency of Social Gatherings with Friends and Family: Patient refused  . Attends Religious Services: Patient refused  . Active Member of Clubs or Organizations: Patient refused  . Attends Archivist Meetings: Patient refused  . Marital Status: Patient refused  Intimate Partner Violence: Unknown  . Fear of Current or Ex-Partner: Patient refused  . Emotionally Abused: Patient refused  . Physically Abused: Patient refused  . Sexually Abused: Patient refused    Family History  Problem Relation Age of Onset  . Scleroderma Mother   . Raynaud syndrome Mother   . Cancer Father 47       colon cancer  . Heart disease Brother        died of heart attack  . Diabetes Brother      Current Outpatient Medications:  .  albuterol (VENTOLIN HFA) 108 (90 Base) MCG/ACT inhaler, Inhale 1 puff into the lungs every 6 (six) hours as needed for wheezing or shortness of breath., Disp: 18 g, Rfl: 8 .  apixaban (ELIQUIS) 5 MG TABS tablet, Take 1 tablet (5 mg total) by mouth 2 (two) times daily., Disp: 180 tablet, Rfl: 1 .  atorvastatin (LIPITOR) 40 MG tablet, TAKE 1 TABLET BY MOUTH  DAILY (Patient taking differently: Take 40 mg by mouth daily. ), Disp: 90 tablet, Rfl: 3 .  Blood Glucose Monitoring Suppl (ACCU-CHEK COMPACT CARE KIT) KIT, To check blood sugar daily., Disp: 1 kit, Rfl: 0 .  Blood Glucose Monitoring Suppl (ACCU-CHEK GUIDE) w/Device KIT, , Disp: , Rfl:  .  clonazePAM (KLONOPIN) 1 MG tablet, TAKE 1 TABLET BY MOUTH THREE TIMES A DAY AS NEEDED FOR ANXIETY, Disp: 90 tablet, Rfl: 5 .  dexamethasone (DECADRON) 4 MG tablet,  Take 1 tablet (4 mg total) by mouth 2 (two) times daily with a meal., Disp: 60 tablet, Rfl: 0 .  dexamethasone (DECADRON) 4 MG tablet, Take 1 tab two times a day the day before Alimta chemo, then take 2 tabs once a day for 3 days starting the day after chemo., Disp: 30 tablet, Rfl: 1 .  folic acid (FOLVITE) 1 MG tablet, Take 1 tablet (1 mg total) by mouth daily. Start 5-7 days before Alimta chemotherapy. Continue until 21 days after Alimta completed., Disp: 100 tablet, Rfl: 3 .  furosemide (LASIX) 40 MG tablet, Take 1 tablet (40 mg total)  by mouth daily as needed for fluid., Disp: 90 tablet, Rfl: 1 .  glimepiride (AMARYL) 2 MG tablet, Take 1 tablet (2 mg total) by mouth 2 (two) times daily., Disp: 180 tablet, Rfl: 1 .  glucose blood (ACCU-CHEK COMPACT PLUS) test strip, To check blood sugar daily., Disp: 100 each, Rfl: 12 .  hydrocortisone 1 % ointment, Apply 1 application topically 2 (two) times daily. May use for skin irritation on bilateral eyelids. Do not get inside of eyes. (Patient taking differently: Apply 1 application topically 2 (two) times daily as needed for itching. May use for skin irritation on bilateral eyelids. Do not get inside of eyes.), Disp: 30 g, Rfl: 0 .  Lancets (ACCU-CHEK SOFT TOUCH) lancets, To check blood sugar daily., Disp: 100 each, Rfl: 12 .  levothyroxine (SYNTHROID) 100 MCG tablet, Take 1 tablet (100 mcg total) by mouth daily at 6 (six) AM., Disp: 90 tablet, Rfl: 1 .  lidocaine-prilocaine (EMLA) cream, Apply to affected area once, Disp: 30 g, Rfl: 3 .  lisinopril (ZESTRIL) 10 MG tablet, TAKE 1 TABLET BY MOUTH  DAILY (Patient taking differently: Take 10 mg by mouth daily. ), Disp: 90 tablet, Rfl: 3 .  LORazepam (ATIVAN) 0.5 MG tablet, Take 1 tablet (0.5 mg total) by mouth every 6 (six) hours as needed (Nausea or vomiting)., Disp: 30 tablet, Rfl: 0 .  metFORMIN (GLUCOPHAGE-XR) 500 MG 24 hr tablet, Take 1 tablet (500 mg total) by mouth 2 (two) times daily., Disp: 180 tablet,  Rfl: 1 .  metoprolol tartrate (LOPRESSOR) 50 MG tablet, Take 1 tablet (50 mg total) by mouth 2 (two) times daily., Disp: 180 tablet, Rfl: 1 .  ondansetron (ZOFRAN ODT) 4 MG disintegrating tablet, Take 1 tablet (4 mg total) by mouth every 8 (eight) hours as needed for nausea or vomiting., Disp: 60 tablet, Rfl: 1 .  ondansetron (ZOFRAN) 8 MG tablet, Take 1 tablet (8 mg total) by mouth 2 (two) times daily as needed (Nausea or vomiting). Start if needed on the third day after chemotherapy., Disp: 30 tablet, Rfl: 1 .  ONETOUCH DELICA LANCETS 50P MISC, CHECK BLOOD SUGAR ONCE  DAILY, Disp: 100 each, Rfl: 11 .  oxyCODONE (OXY IR/ROXICODONE) 5 MG immediate release tablet, Take 1 tablet (5 mg total) by mouth every 4 (four) hours as needed for severe pain., Disp: 60 tablet, Rfl: 0 .  prochlorperazine (COMPAZINE) 10 MG tablet, Take 1 tablet (10 mg total) by mouth every 6 (six) hours as needed (Nausea or vomiting)., Disp: 30 tablet, Rfl: 1 .  pyridOXINE (VITAMIN B-6) 100 MG tablet, Take 100 mg by mouth daily., Disp: , Rfl:  .  sitaGLIPtin (JANUVIA) 100 MG tablet, Take 100 mg by mouth daily., Disp: , Rfl:  .  vitamin B-12 (CYANOCOBALAMIN) 100 MCG tablet, Take 100 mcg by mouth daily., Disp: , Rfl:   Physical exam: There were no vitals filed for this visit. Limited d/t virtual platform  CMP Latest Ref Rng & Units 05/14/2019  Glucose 70 - 99 mg/dL 237(H)  BUN 8 - 23 mg/dL 16  Creatinine 0.44 - 1.00 mg/dL 0.57  Sodium 135 - 145 mmol/L 138  Potassium 3.5 - 5.1 mmol/L 4.0  Chloride 98 - 111 mmol/L 104  CO2 22 - 32 mmol/L 26  Calcium 8.9 - 10.3 mg/dL 8.0(L)  Total Protein 6.5 - 8.1 g/dL -  Total Bilirubin 0.3 - 1.2 mg/dL -  Alkaline Phos 38 - 126 U/L -  AST 15 - 41 U/L -  ALT 0 -  44 U/L -   CBC Latest Ref Rng & Units 05/14/2019  WBC 4.0 - 10.5 K/uL 13.9(H)  Hemoglobin 12.0 - 15.0 g/dL 14.5  Hematocrit 36.0 - 46.0 % 43.6  Platelets 150 - 400 K/uL 246    No images are attached to the encounter.  CT Head W  Wo Contrast  Result Date: 05/08/2019 CLINICAL DATA:  Abnormal PET-CT with a left lung mass, bone metastases, and hypodensity in the left cerebral hemisphere. EXAM: CT HEAD WITHOUT AND WITH CONTRAST TECHNIQUE: Contiguous axial images were obtained from the base of the skull through the vertex without and with intravenous contrast CONTRAST:  68m OMNIPAQUE IOHEXOL 300 MG/ML  SOLN COMPARISON:  PET-CT 04/27/2019 FINDINGS: Brain: There is no evidence of acute infarct, intracranial hemorrhage, midline shift, or extra-axial fluid collection. A chronic left parietal infarct is noted. Bilateral cerebral white matter hypodensities are nonspecific but compatible with mild chronic small vessel ischemic disease. Mild cerebral atrophy is not greater than expected for age. There is an 11 x 8 mm enhancing mass anteriorly in the left frontal lobe with mild edema. Additional enhancing lesions are questioned in the right frontal lobe measuring 3 mm (series 4, image 16 and series 6, image 13) and at the right temporoparietal junction measuring 3 mm (series 6, image 10). Vascular: Calcified atherosclerosis at the skull base. Patent dural venous sinuses. Skull: 5 mm lucent lesion in the left frontal skull (series 3, image 58). Sinuses/Orbits: Mild mucosal thickening or small volume fluid in the ethmoid and sphenoid sinuses bilaterally. Clear mastoid air cells. Unremarkable visualized orbits. Other: None. IMPRESSION: 1. 11 mm left frontal lobe mass with mild edema consistent with metastatic disease. 2. Two questionable additional lesions measuring 3 mm in the right frontal lobe and at the right temporoparietal junction. 3. Indeterminate 5 mm left frontal skull lesion. 4. 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 Electronically Signed   By: ALogan BoresM.D.   On: 05/08/2019 08:50   PERIPHERAL VASCULAR CATHETERIZATION  Result Date: 05/14/2019 See op note  CT BIOPSY  Result Date:  05/13/2019 INDICATION: 79year old with a left lung lesion and multiple bone lesions. Tissue diagnosis is needed. EXAM: CT-GUIDED CORE BIOPSY OF LEFT ACETABULAR BONE LESION MEDICATIONS: None. ANESTHESIA/SEDATION: Moderate (conscious) sedation was employed during this procedure. A total of Versed 2.0 mg and Fentanyl 50 mcg was administered intravenously. Moderate Sedation Time: 35 minutes. The patient's level of consciousness and vital signs were monitored continuously by radiology nursing throughout the procedure under my direct supervision. FLUOROSCOPY TIME:  None COMPLICATIONS: None immediate. PROCEDURE: Informed written consent was obtained from the patient after a thorough discussion of the procedural risks, benefits and alternatives. All questions were addressed. A timeout was performed prior to the initiation of the procedure. Patient was placed supine on the CT scanner. Images through the pelvis were obtained. The lucent lesion in the superior left acetabulum was identified. The overlying skin was prepped with chlorhexidine and sterile field was created. Skin and soft tissues were anesthetized with 1% lidocaine. Using CT guidance, 17 gauge coaxial needle was directed into the lucent lesion through a small cortical bone defect. Needle position was confirmed within the lucent lesion. An 18 gauge core biopsy was obtained with a BioPince device. Core biopsy was predominantly bone and the coaxial needle had to be completely removed in order to obtain the specimen. An 11 gauge bone needle was then directed into the lesion at the area of the cortical defect. Additional core biopsies  were obtained with the coaxial bone biopsy device. An additional small bone core and blood clot was obtained. The small bone core specimens and blood clot were placed in formalin. Follow up CT images were obtained. Bandage placed over the puncture site. FINDINGS: Lucent lesion involving superior left acetabulum. Needles were directed into  the lesion through the small cortical defect. Combination of small bone cores and blood clot were obtained. IMPRESSION: CT-guided core biopsies of the left acetabular bone lesion. Electronically Signed   By: Markus Daft M.D.   On: 05/13/2019 17:14   ECHOCARDIOGRAM COMPLETE  Result Date: 05/12/2019   ECHOCARDIOGRAM REPORT   Patient Name:   EMA HEBNER Date of Exam: 05/12/2019 Medical Rec #:  [id number]       Height:       60.0 in Accession #:    [id number]      Weight:       157.0 lb Date of Birth:  30-Nov-1940        BSA:          1.68 m Patient Age:    5 years        BP:           133/68 mmHg Patient Gender: F               HR:           74 bpm. Exam Location:  [facility] Procedure: 2D Echo, Color Doppler and Cardiac Doppler Indications:     Atrial fibrillation  History:         Patient has no prior history of Echocardiogram examinations.                  CHF, Previous Myocardial Infarction, Defibrillator; Risk                  Factors:Hypertension and Diabetes.  Sonographer:     Charmayne Sheer RDCS (AE) Referring Phys:  Baker Janus Soledad Gerlach NIU Diagnosing Phys: Nelva Bush MD IMPRESSIONS  1. Left ventricular ejection fraction, by visual estimation, is 45 to 50%. The left ventricle has mildly decreased function. There is no left ventricular hypertrophy.  2. Elevated left atrial pressure.  3. Left ventricular diastolic parameters are consistent with Grade II diastolic dysfunction (pseudonormalization).  4. Mild to moderately dilated left ventricular internal cavity size.  5. The left ventricle demonstrates regional wall motion abnormalities.  6. Global right ventricle has normal systolic function.The right ventricular size is normal. No increase in right ventricular wall thickness.  7. Left atrial size was mildly dilated.  8. Right atrial size was not well visualized.  9. The mitral valve is degenerative. Mild to moderate mitral valve regurgitation. No evidence of mitral stenosis. 10. The tricuspid valve is not well visualized.  11. The tricuspid valve is not well visualized. Tricuspid valve regurgitation is trivial. 12. The aortic valve was not well visualized. Aortic valve regurgitation is trivial. Mild to moderate aortic valve sclerosis/calcification without any evidence of aortic stenosis. 13. The pulmonic valve was not well visualized. Pulmonic valve regurgitation is not visualized. 14. Mildly elevated pulmonary artery systolic pressure. 15. A pacer wire is visualized in the RV. 16. The inferior vena cava is dilated in size with <50% respiratory variability, suggesting right atrial pressure of 15 mmHg. 17. The interatrial septum was not well visualized. FINDINGS  Left Ventricle: Left ventricular ejection fraction, by visual estimation, is 45 to 50%. The left ventricle has mildly decreased function. Severe hypokinesis of the left ventricular, mid-apical anterior  wall, anteroseptal wall and apical segment. The left ventricle demonstrates regional wall motion abnormalities. The left ventricular internal cavity size was mildly to moderately dilated left ventricle. There is no left ventricular hypertrophy. Left ventricular diastolic parameters are consistent with  Grade II diastolic dysfunction (pseudonormalization). Elevated left atrial pressure. Right Ventricle: The right ventricular size is normal. No increase in right ventricular wall thickness. Global RV systolic function is has normal systolic function. The tricuspid regurgitant velocity is 2.46 m/s, and with an assumed right atrial pressure  of 15 mmHg, the estimated right ventricular systolic pressure is mildly elevated at 39.2 mmHg. Left Atrium: Left atrial size was mildly dilated. Right Atrium: Right atrial size was not well visualized Pericardium: There is no evidence of pericardial effusion. Mitral Valve: The mitral valve is degenerative in appearance. There is mild thickening of the mitral valve leaflet(s). There is mild calcification of the mitral valve leaflet(s). Mild to  moderate mitral valve regurgitation. No evidence of mitral valve stenosis by observation. MV peak gradient, 7.4 mmHg. Tricuspid Valve: The tricuspid valve is not well visualized. Tricuspid valve regurgitation is trivial. Aortic Valve: The aortic valve was not well visualized. . There is moderate thickening and mild calcification of the aortic valve. Aortic valve regurgitation is trivial. Mild to moderate aortic valve sclerosis/calcification is present, without any evidence of aortic stenosis. Mild aortic valve annular calcification. There is moderate thickening of the aortic valve. There is mild calcification of the aortic valve. Aortic valve mean gradient measures 4.0 mmHg. Aortic valve peak gradient measures 8.0  mmHg. Aortic valve area, by VTI measures 2.04 cm. Pulmonic Valve: The pulmonic valve was not well visualized. Pulmonic valve regurgitation is not visualized. Pulmonic regurgitation is not visualized. Aorta: The aortic root is normal in size and structure. Venous: The inferior vena cava is dilated in size with less than 50% respiratory variability, suggesting right atrial pressure of 15 mmHg. IAS/Shunts: The interatrial septum was not well visualized. Additional Comments: A pacer wire is visualized in the right ventricle.  LEFT VENTRICLE PLAX 2D LVIDd:         5.67 cm  Diastology LVIDs:         4.05 cm  LV e' lateral:   5.66 cm/s LV PW:         0.95 cm  LV E/e' lateral: 20.0 LV IVS:        0.84 cm  LV e' medial:    4.90 cm/s LVOT diam:     2.00 cm  LV E/e' medial:  23.1 LV SV:         86 ml LV SV Index:   48.75 LVOT Area:     3.14 cm  LEFT ATRIUM             Index LA diam:        4.10 cm 2.43 cm/m LA Vol (A2C):   53.5 ml 31.77 ml/m LA Vol (A4C):   73.7 ml 43.76 ml/m LA Biplane Vol: 69.0 ml 40.97 ml/m  AORTIC VALVE                   PULMONIC VALVE AV Area (Vmax):    1.98 cm    PV Vmax:       0.82 m/s AV Area (Vmean):   2.16 cm    PV Vmean:      55.000 cm/s AV Area (VTI):     2.04 cm    PV VTI:         0.131 m  AV Vmax:           141.00 cm/s PV Peak grad:  2.7 mmHg AV Vmean:          95.200 cm/s PV Mean grad:  1.0 mmHg AV VTI:            0.254 m AV Peak Grad:      8.0 mmHg AV Mean Grad:      4.0 mmHg LVOT Vmax:         88.70 cm/s LVOT Vmean:        65.600 cm/s LVOT VTI:          0.165 m LVOT/AV VTI ratio: 0.65  AORTA Ao Root diam: 3.30 cm MITRAL VALVE                         TRICUSPID VALVE MV Area (PHT): 5.84 cm              TR Peak grad:   24.2 mmHg MV Peak grad:  7.4 mmHg              TR Vmax:        267.00 cm/s MV Mean grad:  2.0 mmHg MV Vmax:       1.36 m/s              SHUNTS MV Vmean:      66.2 cm/s             Systemic VTI:  0.16 m MV VTI:        0.29 m                Systemic Diam: 2.00 cm MV PHT:        37.70 msec MV Decel Time: 130 msec MV E velocity: 113.00 cm/s 103 cm/s MV A velocity: 75.20 cm/s  70.3 cm/s MV E/A ratio:  1.50        1.5  Harrell Gave End MD Electronically signed by Nelva Bush MD Signature Date/Time: 05/12/2019/4:59:28 PM    Final      Assessment and plan- Patient is a 79 y.o. female who presents to [facility] for initial meeting in preparation for starting chemotherapy for the treatment of metastatic lung cancer.   1. HPI: Patient is a 79 year old female with a past medical history for congestive heart failure with AICD placement, history of thyroid cancer status post thyroidectomy with recent diagnosis of lung cancer with mets to the bone and brain.  She quit smoking in 2006.  She presented initially with left arm pain which led to an x-ray revealing a humeral fracture.  This was followed by whole-body scan which showed multiple sites of abnormal tracer uptake involving left scapula, bilateral posterior ribs, thoracic and lumbar spine, pelvis and multiple sites in the proximal and distal left femur as well as the left humeral diaphysis.  PET scan showed 4 cm mass in left upper lobe.  Scattered osseous metastasis with humeral pathological fracture.  Bronchoscopy  scheduled for 05/11/2019 but unfortunately patient went into atrial fibrillation requiring cardioversion.  She was discharged on Eliquis.  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  She is scheduled to begin carbo/Alimta with Keytruda on 05/28/2019.  She has already started whole brain radiation and will complete this week.  2. Chemo Care Clinic/High Risk for ER/Hospitalization during chemotherapy- We discussed the role of the chemo care clinic and identified patient specific risk factors.  I discussed that patient was identified as high risk primarily based on: stage of disease.  Patient has past medical history positive for: Past Medical History:  Diagnosis Date  . AICD (automatic cardioverter/defibrillator) present   . Anxiety   . CHF (congestive heart failure) ([place])   . Diabetes mellitus with neuropathy ([place])   . Dyspnea   . Hypertension   . Hypertensive heart disease   . Implantable cardioverter-defibrillator (ICD)-[employer]   . Myocardial infarction, anterior wall ([place])   . Obesity, Class II, BMI 35-39.9     Patient has past surgical history positive for: Past Surgical History:  Procedure Laterality Date  . CARDIOVERSION N/A 05/12/2019   Procedure: CARDIOVERSION;  Surgeon: Nelva Bush, MD;  Location: [facility] ORS;  Service: Cardiovascular;  Laterality: N/A;  . CARPAL TUNNEL RELEASE    . cataract surgery    . EYE SURGERY    . FRACTURE SURGERY Left    ARM  . JOINT REPLACEMENT Left    tkr  . PORTA CATH INSERTION N/A 05/14/2019   Procedure: PORTA CATH INSERTION;  Surgeon: Algernon Huxley, MD;  Location: [place] [facility];  Service: Cardiovascular;  Laterality: N/A;  . VIDEO BRONCHOSCOPY WITH ENDOBRONCHIAL ULTRASOUND N/A 05/11/2019   Procedure: VIDEO BRONCHOSCOPY WITH ENDOBRONCHIAL ULTRASOUND;  Surgeon: Tyler Pita, MD;  Location: [facility] ORS;  Service: Pulmonary;  Laterality: N/A;    Based on our  high risk symptom management report; this patient has a high risk of ED utilization.  The percentage below indicates how "at risk "  this patient based on the factors in this table within one year.   77% High Risk Risk of Admission or ED Visit  This score indicates an adult patient's 1-year risk, as a percentage, of a hospital admission or ED visit.    0%  77%   40%   20%   5 months ago 3 months ago 8 weeks ago Today     22% 11/25/18 2000   55% 02/14/19 1139   77% 05/11/19 1704   77% 05/25/19 1426    Factor Value  Current PCP Mar Daring, [facility] Admissions 2   ED Visits 2   Has Medicaid No  Has Medicare Yes  In relationship No  Has Anemia No  Has asthma No  Has atrial fibrillation Yes  Has CVD Yes  Has chronic kidney disease No  Has Chronic Obstructive Pulmonary Disease No  Has Congestive Heart Failure Yes  Has Connective Tissue Disorder No  Has Depression No  Has Diabetes Yes  Has liver disease No  Has Peripheral Vascular Disease No     3. We discussed that social determinants of health may have significant impacts on health and outcomes for cancer patients.  Today we discussed specific social determinants of performance status, alcohol use, depression, financial needs, food insecurity, housing, interpersonal violence, social connections, stress, tobacco use, and transportation.    After lengthy discussion the following were identified as areas of need:None- she has a great support system at home. Her daughter is very involved in her care.  We discussed options including home based and outpatient services, DME, and CARE program. We discusssed that patients who participate in regular physical activity report fewer negative impacts of cancer and treatments and report less fatigue. We discussed self-referral to [facility] for counseling services, psychiatry for medication management, or palliative care/symptom management as well as primary care providers. We discussed that  living with cancer can create tremendous financial burden.  We discussed options for assistance. I asked that if assistance is needed in affording medications or paying bills to please let us know so that we can provide assistance. We discussed options for food including social services.  We will also notify [facility] to see if cancer center can provide support.  We discussed referral to social work. Will also discuss with Elease Etienne to see if [facility] can provide support of utility bills, etc. We discussed options for support groups at the cancer center. If interested, please notify nurse navigator to enroll. We discussed options for managing stress including healthy eating, exercise as well as participating in no charge counseling services at the cancer center and support groups.  If these are of interest, patient can notify either myself or primary nursing team. We discussed options for management including medications and referral to quit Smart program We discussed options for transportation including acta, paratransit, bus routes, link transit, taxi/uber/lyft, and cancer center van.    5. Based on stage of cancer, I will refer patient to palliative care for goals of care and advanced care planning.  6.  Patient's daughter Gilmore Laroche had questions regarding prescription of Decadron.  Currently taking 4 mg twice daily Decadron that was prescribed by Dr. Donella Stade for brain mets.  She was recently prescribed Decadron prior to chemo and 3 days following chemo to prevent side effects from chemo.  Patient is to continue current steroids 4 mg twice daily until the day of treatment.  At that time, Dr. Janese Banks will advise her on how to continue with steroids.   We also discussed the role of the Symptom Management Clinic at [facility] for acute issues and methods of contacting clinic/provider. She denies needing specific assistance at this time and She will be followed by Moishe Spice , RN.   Visit Diagnosis 1.  Stage 4 malignant neoplasm of lung, unspecified laterality ([place])     Patient expressed understanding and was in agreement with this plan. She also understands that She can call clinic at any time with any questions, concerns, or complaints.   I provided 20 minutes of non face-to-face telephone visit time during this encounter, and > 50% was spent counseling as documented under my assessment & plan.  [facility] [place]  CC: Dr. Janese Banks

## 2019-05-26 ENCOUNTER — Ambulatory Visit
Admission: RE | Admit: 2019-05-26 | Discharge: 2019-05-26 | Disposition: A | Discharge status: Home / Self Care | Payer: Medicare Other | Source: Ambulatory Visit | Attending: Radiation Oncology | Admitting: Radiation Oncology

## 2019-05-26 ENCOUNTER — Other Ambulatory Visit: Payer: Self-pay

## 2019-05-26 ENCOUNTER — Ambulatory Visit: Payer: Medicare Other

## 2019-05-26 DIAGNOSIS — C7951 Secondary malignant neoplasm of bone: Secondary | ICD-10-CM | POA: Diagnosis not present

## 2019-05-26 DIAGNOSIS — Z510 Encounter for antineoplastic radiation therapy: Secondary | ICD-10-CM | POA: Diagnosis not present

## 2019-05-27 ENCOUNTER — Ambulatory Visit
Admission: RE | Admit: 2019-05-27 | Discharge: 2019-05-27 | Disposition: A | Discharge status: Home / Self Care | Payer: Medicare Other | Source: Ambulatory Visit | Attending: Radiation Oncology | Admitting: Radiation Oncology

## 2019-05-27 ENCOUNTER — Telehealth: Payer: Self-pay | Admitting: Oncology

## 2019-05-27 ENCOUNTER — Other Ambulatory Visit: Payer: Self-pay

## 2019-05-27 DIAGNOSIS — C7951 Secondary malignant neoplasm of bone: Secondary | ICD-10-CM | POA: Diagnosis not present

## 2019-05-27 DIAGNOSIS — Z510 Encounter for antineoplastic radiation therapy: Secondary | ICD-10-CM | POA: Diagnosis not present

## 2019-05-27 NOTE — Telephone Encounter (Signed)
[facility] will operate on a 2 hour delay on 05-28-19. Writer phoned patient and rescheduled appts for 06-04-19.

## 2019-05-28 ENCOUNTER — Inpatient Hospital Stay: Payer: Medicare Other

## 2019-05-28 ENCOUNTER — Ambulatory Visit
Admission: RE | Admit: 2019-05-28 | Discharge: 2019-05-28 | Disposition: A | Discharge status: Home / Self Care | Payer: Medicare Other | Source: Ambulatory Visit | Attending: Radiation Oncology | Admitting: Radiation Oncology

## 2019-05-28 ENCOUNTER — Other Ambulatory Visit: Payer: Self-pay

## 2019-05-28 ENCOUNTER — Ambulatory Visit: Payer: Medicare Other

## 2019-05-28 ENCOUNTER — Inpatient Hospital Stay: Payer: Medicare Other | Admitting: Oncology

## 2019-05-28 DIAGNOSIS — Z510 Encounter for antineoplastic radiation therapy: Secondary | ICD-10-CM | POA: Diagnosis not present

## 2019-05-28 DIAGNOSIS — C7951 Secondary malignant neoplasm of bone: Secondary | ICD-10-CM | POA: Diagnosis not present

## 2019-05-29 ENCOUNTER — Ambulatory Visit: Payer: Medicare Other

## 2019-05-29 ENCOUNTER — Other Ambulatory Visit: Payer: Self-pay

## 2019-05-29 ENCOUNTER — Ambulatory Visit
Admission: RE | Admit: 2019-05-29 | Discharge: 2019-05-29 | Disposition: A | Discharge status: Home / Self Care | Payer: Medicare Other | Source: Ambulatory Visit | Attending: Radiation Oncology | Admitting: Radiation Oncology

## 2019-05-29 DIAGNOSIS — Z510 Encounter for antineoplastic radiation therapy: Secondary | ICD-10-CM | POA: Diagnosis not present

## 2019-05-29 DIAGNOSIS — C3412 Malignant neoplasm of upper lobe, left bronchus or lung: Secondary | ICD-10-CM | POA: Diagnosis not present

## 2019-05-29 DIAGNOSIS — C7951 Secondary malignant neoplasm of bone: Secondary | ICD-10-CM | POA: Diagnosis not present

## 2019-05-29 DIAGNOSIS — C7931 Secondary malignant neoplasm of brain: Secondary | ICD-10-CM | POA: Diagnosis not present

## 2019-06-03 ENCOUNTER — Telehealth: Payer: Self-pay

## 2019-06-03 NOTE — Telephone Encounter (Signed)
Copied from [place] [id number]. Topic: General - Other >> Jun 03, 2019 11:40 AM Leward Quan A wrote: Reason for CRM: Patient called to say that she will be having Chemotherapy on 06/04/19 and her daughter Loleta Rose had some questions for Fenton Malling and can be reached at Ph# [phone]

## 2019-06-04 ENCOUNTER — Inpatient Hospital Stay (HOSPITAL_BASED_OUTPATIENT_CLINIC_OR_DEPARTMENT_OTHER): Payer: Medicare Other | Admitting: Oncology

## 2019-06-04 ENCOUNTER — Inpatient Hospital Stay: Payer: Medicare Other

## 2019-06-04 ENCOUNTER — Telehealth: Payer: Self-pay

## 2019-06-04 ENCOUNTER — Encounter: Payer: Self-pay | Admitting: *Deleted

## 2019-06-04 ENCOUNTER — Encounter: Payer: Self-pay | Admitting: Oncology

## 2019-06-04 ENCOUNTER — Other Ambulatory Visit: Payer: Self-pay

## 2019-06-04 VITALS — BP 145/74 | HR 65 | Temp 97.2°F | Wt 151.0 lb

## 2019-06-04 VITALS — BP 105/68 | HR 71 | Temp 98.3°F | Resp 18

## 2019-06-04 DIAGNOSIS — E1122 Type 2 diabetes mellitus with diabetic chronic kidney disease: Secondary | ICD-10-CM | POA: Diagnosis not present

## 2019-06-04 DIAGNOSIS — Z7189 Other specified counseling: Secondary | ICD-10-CM | POA: Diagnosis not present

## 2019-06-04 DIAGNOSIS — I130 Hypertensive heart and chronic kidney disease with heart failure and stage 1 through stage 4 chronic kidney disease, or unspecified chronic kidney disease: Secondary | ICD-10-CM | POA: Diagnosis not present

## 2019-06-04 DIAGNOSIS — Z5112 Encounter for antineoplastic immunotherapy: Secondary | ICD-10-CM

## 2019-06-04 DIAGNOSIS — C7951 Secondary malignant neoplasm of bone: Secondary | ICD-10-CM

## 2019-06-04 DIAGNOSIS — Z7983 Long term (current) use of bisphosphonates: Secondary | ICD-10-CM

## 2019-06-04 DIAGNOSIS — N189 Chronic kidney disease, unspecified: Secondary | ICD-10-CM | POA: Diagnosis not present

## 2019-06-04 DIAGNOSIS — Z5111 Encounter for antineoplastic chemotherapy: Secondary | ICD-10-CM

## 2019-06-04 DIAGNOSIS — C7931 Secondary malignant neoplasm of brain: Secondary | ICD-10-CM | POA: Diagnosis not present

## 2019-06-04 DIAGNOSIS — Z79899 Other long term (current) drug therapy: Secondary | ICD-10-CM | POA: Diagnosis not present

## 2019-06-04 DIAGNOSIS — Z794 Long term (current) use of insulin: Secondary | ICD-10-CM | POA: Diagnosis not present

## 2019-06-04 DIAGNOSIS — G893 Neoplasm related pain (acute) (chronic): Secondary | ICD-10-CM

## 2019-06-04 DIAGNOSIS — I4891 Unspecified atrial fibrillation: Secondary | ICD-10-CM | POA: Diagnosis not present

## 2019-06-04 DIAGNOSIS — C3490 Malignant neoplasm of unspecified part of unspecified bronchus or lung: Secondary | ICD-10-CM

## 2019-06-04 LAB — COMPREHENSIVE METABOLIC PANEL
ALT: 33 U/L (ref 0–44)
AST: 17 U/L (ref 15–41)
Albumin: 3.4 g/dL — ABNORMAL LOW (ref 3.5–5.0)
Alkaline Phosphatase: 334 U/L — ABNORMAL HIGH (ref 38–126)
Anion gap: 9 (ref 5–15)
BUN: 21 mg/dL (ref 8–23)
CO2: 27 mmol/L (ref 22–32)
Calcium: 8 mg/dL — ABNORMAL LOW (ref 8.9–10.3)
Chloride: 101 mmol/L (ref 98–111)
Creatinine, Ser: 0.54 mg/dL (ref 0.44–1.00)
GFR calc Af Amer: >60 mL/min (ref >60)
GFR calc non Af Amer: >60 mL/min (ref >60)
Glucose, Bld: 221 mg/dL — ABNORMAL HIGH (ref 70–99)
Potassium: 3.8 mmol/L (ref 3.5–5.1)
Sodium: 137 mmol/L (ref 135–145)
Total Bilirubin: 0.9 mg/dL (ref 0.3–1.2)
Total Protein: 6.3 g/dL — ABNORMAL LOW (ref 6.5–8.1)

## 2019-06-04 LAB — CBC WITH DIFFERENTIAL/PLATELET
Abs Immature Granulocytes: 0.23 10*3/uL — ABNORMAL HIGH (ref 0.00–0.07)
Basophils Absolute: 0 10*3/uL (ref 0.0–0.1)
Basophils Relative: 0 %
Eosinophils Absolute: 0.1 10*3/uL (ref 0.0–0.5)
Eosinophils Relative: 0 %
HCT: 48.4 % — ABNORMAL HIGH (ref 36.0–46.0)
Hemoglobin: 15.6 g/dL — ABNORMAL HIGH (ref 12.0–15.0)
Immature Granulocytes: 2 %
Lymphocytes Relative: 3 %
Lymphs Abs: 0.4 10*3/uL — ABNORMAL LOW (ref 0.7–4.0)
MCH: 27.1 pg (ref 26.0–34.0)
MCHC: 32.2 g/dL (ref 30.0–36.0)
MCV: 84 fL (ref 80.0–100.0)
Monocytes Absolute: 0.7 10*3/uL (ref 0.1–1.0)
Monocytes Relative: 5 %
Neutro Abs: 11.5 10*3/uL — ABNORMAL HIGH (ref 1.7–7.7)
Neutrophils Relative %: 90 %
Platelets: 92 10*3/uL — ABNORMAL LOW (ref 150–400)
RBC: 5.76 MIL/uL — ABNORMAL HIGH (ref 3.87–5.11)
RDW: 15.9 % — ABNORMAL HIGH (ref 11.5–15.5)
WBC: 12.9 10*3/uL — ABNORMAL HIGH (ref 4.0–10.5)
nRBC: 0 % (ref 0.0–0.2)

## 2019-06-04 LAB — TSH: TSH: 0.22 u[IU]/mL — ABNORMAL LOW (ref 0.350–4.500)

## 2019-06-04 LAB — SURGICAL PATHOLOGY

## 2019-06-04 MED ORDER — SODIUM CHLORIDE 0.9 % IV SOLN
Freq: Once | INTRAVENOUS | Status: AC
  Filled 2019-06-04: qty 250

## 2019-06-04 MED ORDER — SODIUM CHLORIDE 0.9 % IV SOLN
500.0000 mg/m2 | Freq: Once | INTRAVENOUS | Status: AC
  Administered 2019-06-04: 12:00:00 900 mg via INTRAVENOUS
  Filled 2019-06-04: qty 20

## 2019-06-04 MED ORDER — SODIUM CHLORIDE 0.9 % IV SOLN
10.0000 mg | Freq: Once | INTRAVENOUS | Status: AC
  Administered 2019-06-04: 10:00:00 10 mg via INTRAVENOUS
  Filled 2019-06-04: qty 10

## 2019-06-04 MED ORDER — PALONOSETRON HCL INJECTION 0.25 MG/5ML
0.2500 mg | Freq: Once | INTRAVENOUS | Status: AC
  Administered 2019-06-04: 10:00:00 0.25 mg via INTRAVENOUS
  Filled 2019-06-04: qty 5

## 2019-06-04 MED ORDER — ZOLEDRONIC ACID 4 MG/5ML IV CONC
3.5000 mg | Freq: Once | INTRAVENOUS | Status: AC
  Administered 2019-06-04: 13:00:00 3.5 mg via INTRAVENOUS
  Filled 2019-06-04: qty 4.38

## 2019-06-04 MED ORDER — HEPARIN SOD (PORK) LOCK FLUSH 100 UNIT/ML IV SOLN
500.0000 [IU] | Freq: Once | INTRAVENOUS | Status: AC | PRN
  Administered 2019-06-04: 13:00:00 500 [IU]
  Filled 2019-06-04: qty 5

## 2019-06-04 MED ORDER — SODIUM CHLORIDE 0.9 % IV SOLN
150.0000 mg | Freq: Once | INTRAVENOUS | Status: AC
  Administered 2019-06-04: 11:00:00 150 mg via INTRAVENOUS
  Filled 2019-06-04: qty 150

## 2019-06-04 MED ORDER — SODIUM CHLORIDE 0.9 % IV SOLN
200.0000 mg | Freq: Once | INTRAVENOUS | Status: AC
  Administered 2019-06-04: 11:00:00 200 mg via INTRAVENOUS
  Filled 2019-06-04: qty 8

## 2019-06-04 MED ORDER — SODIUM CHLORIDE 0.9 % IV SOLN
318.8000 mg | Freq: Once | INTRAVENOUS | Status: AC
  Administered 2019-06-04: 12:00:00 320 mg via INTRAVENOUS
  Filled 2019-06-04: qty 32

## 2019-06-04 MED ORDER — SODIUM CHLORIDE 0.9% FLUSH
10.0000 mL | Freq: Once | INTRAVENOUS | Status: AC
  Administered 2019-06-04: 09:00:00 10 mL via INTRAVENOUS
  Filled 2019-06-04: qty 10

## 2019-06-04 MED ORDER — HEPARIN SOD (PORK) LOCK FLUSH 100 UNIT/ML IV SOLN
INTRAVENOUS | Status: AC
  Filled 2019-06-04: qty 5

## 2019-06-04 NOTE — Telephone Encounter (Signed)
Called patient's son-Chris to let him know that his mom's injection will be cancelled due to her platelets being pretty good and her insurance was not going to cover it. I also told him that next week his mom's labs were going to be drawn and if her platelets were low, then the insurance would cover the injection. Gerald Stabs understood and had no further questions.

## 2019-06-04 NOTE — Progress Notes (Signed)
PLT 92, ok to proceed per md

## 2019-06-04 NOTE — Progress Notes (Signed)
Pt tolerated chemotherapy infusion well with no signs of complications or reaction. *See flowsheet for vital signs* RN educated pt on the importance of calling the clinic if any signs of complications or reactions occur at home or if it is an emergency to call 911. Pt verbalized understanding and all questions answered at this time. VSS. Pt stable for discharge.   Megan Shelton [employer]

## 2019-06-04 NOTE — Progress Notes (Signed)
MD Janese Banks ok with proceeding with Zometa; Corrected calcium: 8.48  Larene Beach, PharmD

## 2019-06-04 NOTE — Progress Notes (Signed)
  Oncology Nurse Navigator Documentation  Navigator Location: [facility] (06/04/19 1300)   )Navigator Encounter Type: Treatment (06/04/19 1300)     Confirmed Diagnosis Date: 05/18/19 (06/04/19 1300)             Treatment Initiated Date: 04/29/19 (06/04/19 1300) Patient Visit Type: MedOnc (06/04/19 1300) Treatment Phase: First Chemo Tx (06/04/19 1300) Barriers/Navigation Needs: Coordination of Care;Education (06/04/19 1300) Education: Newly Diagnosed Cancer Education;Understanding Cancer/ Treatment Options (06/04/19 1300) Interventions: Coordination of Care;Education (06/04/19 1300)   Coordination of Care: Appts (06/04/19 1300) Education Method: Verbal;Written (06/04/19 1300)       met with patient before receiving first dose of chemotherapy today. All questions answered during visit. Pt given resources regarding diagnosis and supportive services available. Pt informed that appts for next visit with be scheduled and given to her while she is in the infusion area. Contact info given and instructed to call with has any further questions or needs. Pt verbalized understanding. Nothing further needed at this time.         Time Spent with Patient: 45 (06/04/19 1300)

## 2019-06-04 NOTE — Telephone Encounter (Signed)
Called and all questions had been answered already

## 2019-06-05 ENCOUNTER — Inpatient Hospital Stay: Payer: Medicare Other

## 2019-06-05 ENCOUNTER — Encounter: Payer: Self-pay | Admitting: Oncology

## 2019-06-05 ENCOUNTER — Telehealth: Payer: Self-pay

## 2019-06-05 ENCOUNTER — Ambulatory Visit: Payer: Medicare Other

## 2019-06-05 LAB — T4: T4, Total: 8.4 ug/dL (ref 4.5–12.0)

## 2019-06-05 NOTE — Telephone Encounter (Signed)
Telephone call to pt for follow up after receiving first infusion yesterday.   No answer but left message letting her know I was checking on her.  Encouraged pt to call for any questions or concerns.

## 2019-06-06 NOTE — Progress Notes (Signed)
Hematology/Oncology Consult note [facility]  Telephone:([phone] [phone]  Patient Care Team: Rubye Beach as PCP - General (Family Medicine) Deboraha Sprang, MD as PCP - Cardiology (Cardiology) Telford Nab, RN as Registered Nurse   Name of the patient: Megan Shelton  [id number]  10/23/1940   Date of visit: 06/06/19  Diagnosis- metastatic lung cancer  Chief complaint/ Reason for visit- on treatment assessment prior to cycle 1 of carbo/alimta/ keytruda chemotherapy  Heme/Onc history: patient is a 79 year old female with a past medical history significant for congestive heart failure with AICD placement, history of thyroid cancer s/p thyroidectomy. She was also a chronic smoker but quit smoking in 2006. More recently patient presented with pain in her left arm which led to an x-ray. X-ray showed comminuted possibly pathologic midshaft humeral fracture. This was followed by a whole-body bone scan which showed multiple sites of abnormal tracer uptake involving left scapula, bilateral posterior ribs, thoracic and lumbar spine, pelvis and multiple sites in the proximal and distal left femur as well as left humeral diaphysis.   PET CT scan shows 4 cm left retrohilar mass in the upper lobe but invading across the major fissure into the left lower lobe with an SUV of 9.1.  Scattered osseous metastasis with humeral pathological fracture.  Mri brain showed brain mets  She completed palliative WBRT and palliative RT to left arm and right hip  Acetabular biopsy showed adenocarcinoma. NGS pending  Interval history- pain in left arm is well controlled and she has some limited mobility there. Reports chronic fatigue  ECOG PS- 1 Pain scale- 0 Opioid associated constipation-no  Review of systems- Review of Systems  Constitutional: Positive for malaise/fatigue. Negative for chills, fever and weight loss.  HENT: Negative for congestion,  ear discharge and nosebleeds.   Eyes: Negative for blurred vision.  Respiratory: Negative for cough, hemoptysis, sputum production, shortness of breath and wheezing.   Cardiovascular: Negative for chest pain, palpitations, orthopnea and claudication.  Gastrointestinal: Negative for abdominal pain, blood in stool, constipation, diarrhea, heartburn, melena, nausea and vomiting.  Genitourinary: Negative for dysuria, flank pain, frequency, hematuria and urgency.  Musculoskeletal: Negative for back pain, joint pain and myalgias.       Left arm pain  Skin: Negative for rash.  Neurological: Negative for dizziness, tingling, focal weakness, seizures, weakness and headaches.  Endo/Heme/Allergies: Does not bruise/bleed easily.  Psychiatric/Behavioral: Negative for depression and suicidal ideas. The patient does not have insomnia.      Allergies  Allergen Reactions  . Latex Rash  . Nickel Rash     Past Medical History:  Diagnosis Date  . AICD (automatic cardioverter/defibrillator) present   . Anxiety   . CHF (congestive heart failure) ([place])   . Diabetes mellitus with neuropathy ([place])   . Dyspnea   . Hypertension   . Hypertensive heart disease   . Implantable cardioverter-defibrillator (ICD)-[employer]   . Myocardial infarction, anterior wall ([place])   . Obesity, Class II, BMI 35-39.9      Past Surgical History:  Procedure Laterality Date  . CARDIOVERSION N/A 05/12/2019   Procedure: CARDIOVERSION;  Surgeon: Nelva Bush, MD;  Location: [facility] ORS;  Service: Cardiovascular;  Laterality: N/A;  . CARPAL TUNNEL RELEASE    . cataract surgery    . EYE SURGERY    . FRACTURE SURGERY Left    ARM  . JOINT REPLACEMENT Left    tkr  . PORTA CATH INSERTION N/A 05/14/2019   Procedure: Glori Luis  CATH INSERTION;  Surgeon: Algernon Huxley, MD;  Location: [place] [facility];  Service: Cardiovascular;  Laterality: N/A;  . VIDEO BRONCHOSCOPY WITH ENDOBRONCHIAL ULTRASOUND N/A 05/11/2019   Procedure: VIDEO  BRONCHOSCOPY WITH ENDOBRONCHIAL ULTRASOUND;  Surgeon: Tyler Pita, MD;  Location: [facility] ORS;  Service: Pulmonary;  Laterality: N/A;    Social History   Socioeconomic History  . Marital status: Widowed    Spouse name: Not on file  . Number of children: 3  . Years of education: Not on file  . Highest education level: High school graduate  Occupational History  . Occupation: retired  Tobacco Use  . Smoking status: Former Smoker    Packs/day: 1.00    Years: 55.00    Pack years: 55.00    Types: Cigarettes    Quit date: 2006    Years since quitting: 15.1  . Smokeless tobacco: Never Used  . Tobacco comment: quit about 18 years ago; as of 2019  Substance and Sexual Activity  . Alcohol use: No  . Drug use: No  . Sexual activity: Not on file  Other Topics Concern  . Not on file  Social History Narrative  . Not on file   Social Determinants of Health   Financial Resource Strain:   . Difficulty of Paying Living Expenses: Not on file  Food Insecurity:   . Worried About Charity fundraiser in the Last Year: Not on file  . Ran Out of Food in the Last Year: Not on file  Transportation Needs:   . Lack of Transportation (Medical): Not on file  . Lack of Transportation (Non-Medical): Not on file  Physical Activity: Inactive  . Days of Exercise per Week: 0 days  . Minutes of Exercise per Session: 0 min  Stress: No Stress Concern Present  . Feeling of Stress : Not at all  Social Connections: Unknown  . Frequency of Communication with Friends and Family: Patient refused  . Frequency of Social Gatherings with Friends and Family: Patient refused  . Attends Religious Services: Patient refused  . Active Member of Clubs or Organizations: Patient refused  . Attends Archivist Meetings: Patient refused  . Marital Status: Patient refused  Intimate Partner Violence: Unknown  . Fear of Current or Ex-Partner: Patient refused  . Emotionally Abused: Patient refused  . Physically  Abused: Patient refused  . Sexually Abused: Patient refused    Family History  Problem Relation Age of Onset  . Scleroderma Mother   . Raynaud syndrome Mother   . Cancer Father 61       colon cancer  . Heart disease Brother        died of heart attack  . Diabetes Brother      Current Outpatient Medications:  .  apixaban (ELIQUIS) 5 MG TABS tablet, Take 1 tablet (5 mg total) by mouth 2 (two) times daily., Disp: 180 tablet, Rfl: 1 .  atorvastatin (LIPITOR) 40 MG tablet, TAKE 1 TABLET BY MOUTH  DAILY (Patient taking differently: Take 40 mg by mouth daily. ), Disp: 90 tablet, Rfl: 3 .  Blood Glucose Monitoring Suppl (ACCU-CHEK COMPACT CARE KIT) KIT, To check blood sugar daily., Disp: 1 kit, Rfl: 0 .  Blood Glucose Monitoring Suppl (ACCU-CHEK GUIDE) w/Device KIT, , Disp: , Rfl:  .  dexamethasone (DECADRON) 4 MG tablet, Take 1 tab two times a day the day before Alimta chemo, then take 2 tabs once a day for 3 days starting the day after chemo., Disp:  30 tablet, Rfl: 1 .  folic acid (FOLVITE) 1 MG tablet, Take 1 tablet (1 mg total) by mouth daily. Start 5-7 days before Alimta chemotherapy. Continue until 21 days after Alimta completed., Disp: 100 tablet, Rfl: 3 .  glimepiride (AMARYL) 2 MG tablet, Take 1 tablet (2 mg total) by mouth 2 (two) times daily., Disp: 180 tablet, Rfl: 1 .  glucose blood (ACCU-CHEK COMPACT PLUS) test strip, To check blood sugar daily., Disp: 100 each, Rfl: 12 .  hydrocortisone 1 % ointment, Apply 1 application topically 2 (two) times daily. May use for skin irritation on bilateral eyelids. Do not get inside of eyes. (Patient taking differently: Apply 1 application topically 2 (two) times daily as needed for itching. May use for skin irritation on bilateral eyelids. Do not get inside of eyes.), Disp: 30 g, Rfl: 0 .  Lancets (ACCU-CHEK SOFT TOUCH) lancets, To check blood sugar daily., Disp: 100 each, Rfl: 12 .  levothyroxine (SYNTHROID) 100 MCG tablet, Take 1 tablet (100 mcg  total) by mouth daily at 6 (six) AM., Disp: 90 tablet, Rfl: 1 .  lidocaine-prilocaine (EMLA) cream, Apply to affected area once, Disp: 30 g, Rfl: 3 .  lisinopril (ZESTRIL) 10 MG tablet, TAKE 1 TABLET BY MOUTH  DAILY (Patient taking differently: Take 10 mg by mouth daily. ), Disp: 90 tablet, Rfl: 3 .  metFORMIN (GLUCOPHAGE-XR) 500 MG 24 hr tablet, Take 1 tablet (500 mg total) by mouth 2 (two) times daily., Disp: 180 tablet, Rfl: 1 .  metoprolol tartrate (LOPRESSOR) 50 MG tablet, Take 1 tablet (50 mg total) by mouth 2 (two) times daily., Disp: 180 tablet, Rfl: 1 .  ONETOUCH DELICA LANCETS 30Q MISC, CHECK BLOOD SUGAR ONCE  DAILY, Disp: 100 each, Rfl: 11 .  oxyCODONE (OXY IR/ROXICODONE) 5 MG immediate release tablet, Take 1 tablet (5 mg total) by mouth every 4 (four) hours as needed for severe pain., Disp: 60 tablet, Rfl: 0 .  pyridOXINE (VITAMIN B-6) 100 MG tablet, Take 100 mg by mouth daily., Disp: , Rfl:  .  sitaGLIPtin (JANUVIA) 100 MG tablet, Take 100 mg by mouth daily., Disp: , Rfl:  .  vitamin B-12 (CYANOCOBALAMIN) 100 MCG tablet, Take 100 mcg by mouth daily., Disp: , Rfl:  .  albuterol (VENTOLIN HFA) 108 (90 Base) MCG/ACT inhaler, Inhale 1 puff into the lungs every 6 (six) hours as needed for wheezing or shortness of breath. (Patient not taking: Reported on 06/04/2019), Disp: 18 g, Rfl: 8 .  clonazePAM (KLONOPIN) 1 MG tablet, TAKE 1 TABLET BY MOUTH THREE TIMES A DAY AS NEEDED FOR ANXIETY (Patient not taking: Reported on 06/04/2019), Disp: 90 tablet, Rfl: 5 .  furosemide (LASIX) 40 MG tablet, Take 1 tablet (40 mg total) by mouth daily as needed for fluid. (Patient not taking: Reported on 06/04/2019), Disp: 90 tablet, Rfl: 1 .  LORazepam (ATIVAN) 0.5 MG tablet, Take 1 tablet (0.5 mg total) by mouth every 6 (six) hours as needed (Nausea or vomiting). (Patient not taking: Reported on 06/04/2019), Disp: 30 tablet, Rfl: 0 .  ondansetron (ZOFRAN ODT) 4 MG disintegrating tablet, Take 1 tablet (4 mg total) by  mouth every 8 (eight) hours as needed for nausea or vomiting. (Patient not taking: Reported on 06/04/2019), Disp: 60 tablet, Rfl: 1 .  ondansetron (ZOFRAN) 8 MG tablet, Take 1 tablet (8 mg total) by mouth 2 (two) times daily as needed (Nausea or vomiting). Start if needed on the third day after chemotherapy. (Patient not taking: Reported on 06/04/2019), Disp:  30 tablet, Rfl: 1 .  prochlorperazine (COMPAZINE) 10 MG tablet, Take 1 tablet (10 mg total) by mouth every 6 (six) hours as needed (Nausea or vomiting). (Patient not taking: Reported on 06/04/2019), Disp: 30 tablet, Rfl: 1  Physical exam:  Vitals:   06/04/19 0910  BP: (!) 145/74  Pulse: 65  Temp: (!) 97.2 F (36.2 C)  TempSrc: Tympanic  SpO2: 95%  Weight: 151 lb (68.5 kg)   Physical Exam HENT:     Head: Normocephalic and atraumatic.  Eyes:     Pupils: Pupils are equal, round, and reactive to light.  Cardiovascular:     Rate and Rhythm: Normal rate and regular rhythm.     Heart sounds: Normal heart sounds.  Pulmonary:     Effort: Pulmonary effort is normal.     Breath sounds: Normal breath sounds.  Abdominal:     General: Bowel sounds are normal.     Palpations: Abdomen is soft.  Musculoskeletal:     Cervical back: Normal range of motion.  Skin:    General: Skin is warm and dry.  Neurological:     Mental Status: She is alert and oriented to person, place, and time.      CMP Latest Ref Rng & Units 06/04/2019  Glucose 70 - 99 mg/dL 221(H)  BUN 8 - 23 mg/dL 21  Creatinine 0.44 - 1.00 mg/dL 0.54  Sodium 135 - 145 mmol/L 137  Potassium 3.5 - 5.1 mmol/L 3.8  Chloride 98 - 111 mmol/L 101  CO2 22 - 32 mmol/L 27  Calcium 8.9 - 10.3 mg/dL 8.0(L)  Total Protein 6.5 - 8.1 g/dL 6.3(L)  Total Bilirubin 0.3 - 1.2 mg/dL 0.9  Alkaline Phos 38 - 126 U/L 334(H)  AST 15 - 41 U/L 17  ALT 0 - 44 U/L 33   CBC Latest Ref Rng & Units 06/04/2019  WBC 4.0 - 10.5 K/uL 12.9(H)  Hemoglobin 12.0 - 15.0 g/dL 15.6(H)  Hematocrit 36.0 - 46.0 %  48.4(H)  Platelets 150 - 400 K/uL 92(L)    No images are attached to the encounter.  CT Head W Wo Contrast  Result Date: 05/08/2019 CLINICAL DATA:  Abnormal PET-CT with a left lung mass, bone metastases, and hypodensity in the left cerebral hemisphere. EXAM: CT HEAD WITHOUT AND WITH CONTRAST TECHNIQUE: Contiguous axial images were obtained from the base of the skull through the vertex without and with intravenous contrast CONTRAST:  64m OMNIPAQUE IOHEXOL 300 MG/ML  SOLN COMPARISON:  PET-CT 04/27/2019 FINDINGS: Brain: There is no evidence of acute infarct, intracranial hemorrhage, midline shift, or extra-axial fluid collection. A chronic left parietal infarct is noted. Bilateral cerebral white matter hypodensities are nonspecific but compatible with mild chronic small vessel ischemic disease. Mild cerebral atrophy is not greater than expected for age. There is an 11 x 8 mm enhancing mass anteriorly in the left frontal lobe with mild edema. Additional enhancing lesions are questioned in the right frontal lobe measuring 3 mm (series 4, image 16 and series 6, image 13) and at the right temporoparietal junction measuring 3 mm (series 6, image 10). Vascular: Calcified atherosclerosis at the skull base. Patent dural venous sinuses. Skull: 5 mm lucent lesion in the left frontal skull (series 3, image 58). Sinuses/Orbits: Mild mucosal thickening or small volume fluid in the ethmoid and sphenoid sinuses bilaterally. Clear mastoid air cells. Unremarkable visualized orbits. Other: None. IMPRESSION: 1. 11 mm left frontal lobe mass with mild edema consistent with metastatic disease. 2. Two questionable additional lesions  measuring 3 mm in the right frontal lobe and at the right temporoparietal junction. 3. Indeterminate 5 mm left frontal skull lesion. 4. 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 Electronically Signed   By: Logan Bores M.D.   On: 05/08/2019 08:50   PERIPHERAL  VASCULAR CATHETERIZATION  Result Date: 05/14/2019 See op note  CT BIOPSY  Result Date: 05/13/2019 INDICATION: 79 year old with a left lung lesion and multiple bone lesions. Tissue diagnosis is needed. EXAM: CT-GUIDED CORE BIOPSY OF LEFT ACETABULAR BONE LESION MEDICATIONS: None. ANESTHESIA/SEDATION: Moderate (conscious) sedation was employed during this procedure. A total of Versed 2.0 mg and Fentanyl 50 mcg was administered intravenously. Moderate Sedation Time: 35 minutes. The patient's level of consciousness and vital signs were monitored continuously by radiology nursing throughout the procedure under my direct supervision. FLUOROSCOPY TIME:  None COMPLICATIONS: None immediate. PROCEDURE: Informed written consent was obtained from the patient after a thorough discussion of the procedural risks, benefits and alternatives. All questions were addressed. A timeout was performed prior to the initiation of the procedure. Patient was placed supine on the CT scanner. Images through the pelvis were obtained. The lucent lesion in the superior left acetabulum was identified. The overlying skin was prepped with chlorhexidine and sterile field was created. Skin and soft tissues were anesthetized with 1% lidocaine. Using CT guidance, 17 gauge coaxial needle was directed into the lucent lesion through a small cortical bone defect. Needle position was confirmed within the lucent lesion. An 18 gauge core biopsy was obtained with a BioPince device. Core biopsy was predominantly bone and the coaxial needle had to be completely removed in order to obtain the specimen. An 11 gauge bone needle was then directed into the lesion at the area of the cortical defect. Additional core biopsies were obtained with the coaxial bone biopsy device. An additional small bone core and blood clot was obtained. The small bone core specimens and blood clot were placed in formalin. Follow up CT images were obtained. Bandage placed over the puncture  site. FINDINGS: Lucent lesion involving superior left acetabulum. Needles were directed into the lesion through the small cortical defect. Combination of small bone cores and blood clot were obtained. IMPRESSION: CT-guided core biopsies of the left acetabular bone lesion. Electronically Signed   By: Markus Daft M.D.   On: 05/13/2019 17:14   ECHOCARDIOGRAM COMPLETE  Result Date: 05/12/2019   ECHOCARDIOGRAM REPORT   Patient Name:   Megan Shelton Date of Exam: 05/12/2019 Medical Rec #:  [id number]       Height:       60.0 in Accession #:    [id number]      Weight:       157.0 lb Date of Birth:  13-Dec-1940        BSA:          1.68 m Patient Age:    24 years        BP:           133/68 mmHg Patient Gender: F               HR:           74 bpm. Exam Location:  [facility] Procedure: 2D Echo, Color Doppler and Cardiac Doppler Indications:     Atrial fibrillation  History:         Patient has no prior history of Echocardiogram examinations.  CHF, Previous Myocardial Infarction, Defibrillator; Risk                  Factors:Hypertension and Diabetes.  Sonographer:     Charmayne Sheer RDCS (AE) Referring Phys:  Baker Janus Soledad Gerlach NIU Diagnosing Phys: Nelva Bush MD IMPRESSIONS  1. Left ventricular ejection fraction, by visual estimation, is 45 to 50%. The left ventricle has mildly decreased function. There is no left ventricular hypertrophy.  2. Elevated left atrial pressure.  3. Left ventricular diastolic parameters are consistent with Grade II diastolic dysfunction (pseudonormalization).  4. Mild to moderately dilated left ventricular internal cavity size.  5. The left ventricle demonstrates regional wall motion abnormalities.  6. Global right ventricle has normal systolic function.The right ventricular size is normal. No increase in right ventricular wall thickness.  7. Left atrial size was mildly dilated.  8. Right atrial size was not well visualized.  9. The mitral valve is degenerative. Mild to moderate mitral valve  regurgitation. No evidence of mitral stenosis. 10. The tricuspid valve is not well visualized. 11. The tricuspid valve is not well visualized. Tricuspid valve regurgitation is trivial. 12. The aortic valve was not well visualized. Aortic valve regurgitation is trivial. Mild to moderate aortic valve sclerosis/calcification without any evidence of aortic stenosis. 13. The pulmonic valve was not well visualized. Pulmonic valve regurgitation is not visualized. 14. Mildly elevated pulmonary artery systolic pressure. 15. A pacer wire is visualized in the RV. 16. The inferior vena cava is dilated in size with <50% respiratory variability, suggesting right atrial pressure of 15 mmHg. 17. The interatrial septum was not well visualized. FINDINGS  Left Ventricle: Left ventricular ejection fraction, by visual estimation, is 45 to 50%. The left ventricle has mildly decreased function. Severe hypokinesis of the left ventricular, mid-apical anterior wall, anteroseptal wall and apical segment. The left ventricle demonstrates regional wall motion abnormalities. The left ventricular internal cavity size was mildly to moderately dilated left ventricle. There is no left ventricular hypertrophy. Left ventricular diastolic parameters are consistent with  Grade II diastolic dysfunction (pseudonormalization). Elevated left atrial pressure. Right Ventricle: The right ventricular size is normal. No increase in right ventricular wall thickness. Global RV systolic function is has normal systolic function. The tricuspid regurgitant velocity is 2.46 m/s, and with an assumed right atrial pressure  of 15 mmHg, the estimated right ventricular systolic pressure is mildly elevated at 39.2 mmHg. Left Atrium: Left atrial size was mildly dilated. Right Atrium: Right atrial size was not well visualized Pericardium: There is no evidence of pericardial effusion. Mitral Valve: The mitral valve is degenerative in appearance. There is mild thickening of the  mitral valve leaflet(s). There is mild calcification of the mitral valve leaflet(s). Mild to moderate mitral valve regurgitation. No evidence of mitral valve stenosis by observation. MV peak gradient, 7.4 mmHg. Tricuspid Valve: The tricuspid valve is not well visualized. Tricuspid valve regurgitation is trivial. Aortic Valve: The aortic valve was not well visualized. . There is moderate thickening and mild calcification of the aortic valve. Aortic valve regurgitation is trivial. Mild to moderate aortic valve sclerosis/calcification is present, without any evidence of aortic stenosis. Mild aortic valve annular calcification. There is moderate thickening of the aortic valve. There is mild calcification of the aortic valve. Aortic valve mean gradient measures 4.0 mmHg. Aortic valve peak gradient measures 8.0  mmHg. Aortic valve area, by VTI measures 2.04 cm. Pulmonic Valve: The pulmonic valve was not well visualized. Pulmonic valve regurgitation is not visualized. Pulmonic regurgitation is not visualized.  Aorta: The aortic root is normal in size and structure. Venous: The inferior vena cava is dilated in size with less than 50% respiratory variability, suggesting right atrial pressure of 15 mmHg. IAS/Shunts: The interatrial septum was not well visualized. Additional Comments: A pacer wire is visualized in the right ventricle.  LEFT VENTRICLE PLAX 2D LVIDd:         5.67 cm  Diastology LVIDs:         4.05 cm  LV e' lateral:   5.66 cm/s LV PW:         0.95 cm  LV E/e' lateral: 20.0 LV IVS:        0.84 cm  LV e' medial:    4.90 cm/s LVOT diam:     2.00 cm  LV E/e' medial:  23.1 LV SV:         86 ml LV SV Index:   48.75 LVOT Area:     3.14 cm  LEFT ATRIUM             Index LA diam:        4.10 cm 2.43 cm/m LA Vol (A2C):   53.5 ml 31.77 ml/m LA Vol (A4C):   73.7 ml 43.76 ml/m LA Biplane Vol: 69.0 ml 40.97 ml/m  AORTIC VALVE                   PULMONIC VALVE AV Area (Vmax):    1.98 cm    PV Vmax:       0.82 m/s AV Area  (Vmean):   2.16 cm    PV Vmean:      55.000 cm/s AV Area (VTI):     2.04 cm    PV VTI:        0.131 m AV Vmax:           141.00 cm/s PV Peak grad:  2.7 mmHg AV Vmean:          95.200 cm/s PV Mean grad:  1.0 mmHg AV VTI:            0.254 m AV Peak Grad:      8.0 mmHg AV Mean Grad:      4.0 mmHg LVOT Vmax:         88.70 cm/s LVOT Vmean:        65.600 cm/s LVOT VTI:          0.165 m LVOT/AV VTI ratio: 0.65  AORTA Ao Root diam: 3.30 cm MITRAL VALVE                         TRICUSPID VALVE MV Area (PHT): 5.84 cm              TR Peak grad:   24.2 mmHg MV Peak grad:  7.4 mmHg              TR Vmax:        267.00 cm/s MV Mean grad:  2.0 mmHg MV Vmax:       1.36 m/s              SHUNTS MV Vmean:      66.2 cm/s             Systemic VTI:  0.16 m MV VTI:        0.29 m                Systemic Diam: 2.00 cm MV PHT:  37.70 msec MV Decel Time: 130 msec MV E velocity: 113.00 cm/s 103 cm/s MV A velocity: 75.20 cm/s  70.3 cm/s MV E/A ratio:  1.50        1.5  Nelva Bush MD Electronically signed by Nelva Bush MD Signature Date/Time: 05/12/2019/4:59:28 PM    Final      Assessment and plan- Patient is a 79 y.o. 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 She is here for on treatment assessment prior to cycle 1 of carbo/alimta/ keytruda chemotherapy  Paients had normal platelet counts in the past. She has not yet started chemo. But her platelet count is 92. I will proceed with carbo/alimta/keytruda but reduce carboplatin to auc 4. Insurance has not approved neulasta. So we will see how her wbc trends after cycle 1 of chemo and if need be add with cycle 2.  I will see her in 1 week with labs cbc with diff/cmp for possible fluids.  zometa today for brain mets  Continue eliquis for afib. She would like to stop it as it was started recently after bout of paroxysmal afib in the hospital. She will need to touch base with cardiology regarding this.   Neoplasm related pain- continue  prn oxycodone   Visit Diagnosis 1. Stage 4 malignant neoplasm of lung, unspecified laterality ([place])   2. Encounter for antineoplastic chemotherapy   3. Encounter for antineoplastic immunotherapy   4. Long term current use of bisphosphonates   5. Goals of care, counseling/discussion   6. Neoplasm related pain      Dr. Randa Evens, MD, MPH [facility] [id number] 06/06/2019 12:33 PM

## 2019-06-08 ENCOUNTER — Ambulatory Visit (INDEPENDENT_AMBULATORY_CARE_PROVIDER_SITE_OTHER): Payer: Medicare Other

## 2019-06-08 ENCOUNTER — Encounter: Payer: Self-pay | Admitting: Oncology

## 2019-06-08 DIAGNOSIS — I509 Heart failure, unspecified: Secondary | ICD-10-CM | POA: Diagnosis not present

## 2019-06-08 DIAGNOSIS — Z95810 Presence of automatic (implantable) cardiac defibrillator: Secondary | ICD-10-CM | POA: Diagnosis not present

## 2019-06-09 ENCOUNTER — Encounter: Payer: Self-pay | Admitting: Oncology

## 2019-06-09 MED ORDER — MAGIC MOUTHWASH W/LIDOCAINE: 5.0000 mL | Freq: Four times a day (QID) | ORAL | 0 refills | Status: AC

## 2019-06-09 NOTE — Progress Notes (Signed)
Re: mouth sensitivity  Patient's daughter called asking for something to help with mouth sensitivity and a few mouth sores. (incidence carbo <1% and alimta 15%) Magic mouthwash faxed into her local pharmacy.  She is currently using Biotene and Biotene gel which is helping some.   We reviewed that chemotherapy can cause erythema and soreness along with ulcers making it hard for her to eat and drink.  This can oftentimes cause dehydration which should be avoided.  There also is a risk for infection given mucous membranes are often open or become open.  We discussed prophylactic oral care with the use of biotin and other chlorhexidine to prevent worsening of the mucositis.    Patient to call clinic if symptoms worse or fail to improve.  She may use the Magic mouthwash as needed for mouth sensitivity and mouth sores.  Faythe Casa, NP 06/09/2019 1:07 PM

## 2019-06-10 ENCOUNTER — Other Ambulatory Visit: Payer: Self-pay | Admitting: Oncology

## 2019-06-10 ENCOUNTER — Encounter: Payer: Self-pay | Admitting: Oncology

## 2019-06-10 ENCOUNTER — Telehealth: Payer: Self-pay | Admitting: *Deleted

## 2019-06-10 MED ORDER — OSIMERTINIB MESYLATE 80 MG PO TABS: 80.0000 mg | ORAL_TABLET | Freq: Every day | ORAL | 10 refills | Status: DC

## 2019-06-10 NOTE — Telephone Encounter (Signed)
I would ask her to observe for now. If she has persistent hemoptysis she can call Dr. Domingo Dimes office first or go to ER

## 2019-06-10 NOTE — Progress Notes (Signed)
EPIC Encounter for ICM Monitoring  Patient Name: Megan Shelton is a 79 y.o. female Date: 06/10/2019 Primary Care Physican: Mar Daring, PA-C Primary Cardiologist:Klein Electrophysiologist:Klein 2/25/21Office Weight:151lbs   Transmission reviewed.  OptivolThoracic impedance normal.  Prescribed:Furosemide40 mg take 1 tablet by mouth as needed  Labs: 06/04/2019 Creatinine 0.54, BUN 21, Potassium 3.8, Sodium 137, GFR >60 05/14/2019 Creatinine 0.57, BUN 16, Potassium 4.0, Sodium 138, GFR >60  05/13/2019 Creatinine 0.41, BUN 13, Potassium 3.6, Sodium 140, GFR >60  05/12/2019 Creatinine 0.65, BUN 21, Potassium 4.4, Sodium 138, GFR >60  05/11/2019 Creatinine 0.55, BUN 25, Potassium 4.2, Sodium 138, GFR >60  04/20/2019 Creatinine 0.40, BUN 13, Potassium 3.6, Sodium 137, GFR >60 A complete set of results can be found in Results Review.  Recommendations:None  Follow-up plan: ICM clinic phone appointment on 07/13/2019 to recheck fluid levels. 91 day device clinic remote transmission 06/22/2019.   Copy of ICM check sent to [place]  3 month ICM trend: 06/08/2019    1 Year ICM trend:       Rosalene Billings, RN 06/10/2019 12:53 PM

## 2019-06-10 NOTE — Telephone Encounter (Signed)
Son called reporting that patient woke up very congested this morning and three times she coughed up a quarter sized amount of blood and her congestion is better after that.. Asking if she needs to be evaluated or what needs to be done

## 2019-06-10 NOTE — Telephone Encounter (Signed)
Call returned to son Harrell Gave and advised of doctor response and he wrote this down.

## 2019-06-11 ENCOUNTER — Encounter: Payer: Self-pay | Admitting: *Deleted

## 2019-06-11 ENCOUNTER — Other Ambulatory Visit: Payer: Self-pay

## 2019-06-11 ENCOUNTER — Telehealth: Payer: Self-pay | Admitting: Pharmacy Technician

## 2019-06-11 ENCOUNTER — Telehealth: Payer: Self-pay | Admitting: Pharmacist

## 2019-06-11 ENCOUNTER — Inpatient Hospital Stay (HOSPITAL_BASED_OUTPATIENT_CLINIC_OR_DEPARTMENT_OTHER): Payer: Medicare Other | Admitting: Oncology

## 2019-06-11 ENCOUNTER — Inpatient Hospital Stay: Payer: Medicare Other

## 2019-06-11 ENCOUNTER — Telehealth: Payer: Self-pay | Admitting: *Deleted

## 2019-06-11 ENCOUNTER — Inpatient Hospital Stay: Payer: Medicare Other | Attending: Oncology

## 2019-06-11 VITALS — BP 115/71 | HR 80 | Temp 97.7°F | Resp 20 | Wt 151.3 lb

## 2019-06-11 DIAGNOSIS — Z5111 Encounter for antineoplastic chemotherapy: Secondary | ICD-10-CM | POA: Insufficient documentation

## 2019-06-11 DIAGNOSIS — C3490 Malignant neoplasm of unspecified part of unspecified bronchus or lung: Secondary | ICD-10-CM

## 2019-06-11 DIAGNOSIS — D6959 Other secondary thrombocytopenia: Secondary | ICD-10-CM | POA: Diagnosis not present

## 2019-06-11 DIAGNOSIS — E1122 Type 2 diabetes mellitus with diabetic chronic kidney disease: Secondary | ICD-10-CM | POA: Insufficient documentation

## 2019-06-11 DIAGNOSIS — D701 Agranulocytosis secondary to cancer chemotherapy: Secondary | ICD-10-CM | POA: Insufficient documentation

## 2019-06-11 DIAGNOSIS — Z794 Long term (current) use of insulin: Secondary | ICD-10-CM | POA: Diagnosis not present

## 2019-06-11 DIAGNOSIS — N189 Chronic kidney disease, unspecified: Secondary | ICD-10-CM | POA: Diagnosis not present

## 2019-06-11 DIAGNOSIS — G893 Neoplasm related pain (acute) (chronic): Secondary | ICD-10-CM | POA: Diagnosis not present

## 2019-06-11 DIAGNOSIS — I130 Hypertensive heart and chronic kidney disease with heart failure and stage 1 through stage 4 chronic kidney disease, or unspecified chronic kidney disease: Secondary | ICD-10-CM | POA: Diagnosis not present

## 2019-06-11 DIAGNOSIS — C7931 Secondary malignant neoplasm of brain: Secondary | ICD-10-CM | POA: Diagnosis not present

## 2019-06-11 DIAGNOSIS — C7951 Secondary malignant neoplasm of bone: Secondary | ICD-10-CM | POA: Diagnosis not present

## 2019-06-11 DIAGNOSIS — I4891 Unspecified atrial fibrillation: Secondary | ICD-10-CM | POA: Insufficient documentation

## 2019-06-11 DIAGNOSIS — Z95828 Presence of other vascular implants and grafts: Secondary | ICD-10-CM

## 2019-06-11 DIAGNOSIS — Z79899 Other long term (current) drug therapy: Secondary | ICD-10-CM | POA: Insufficient documentation

## 2019-06-11 DIAGNOSIS — T451X5A Adverse effect of antineoplastic and immunosuppressive drugs, initial encounter: Secondary | ICD-10-CM | POA: Insufficient documentation

## 2019-06-11 LAB — CBC WITH DIFFERENTIAL/PLATELET
Abs Immature Granulocytes: 0.08 10*3/uL — ABNORMAL HIGH (ref 0.00–0.07)
Basophils Absolute: 0 10*3/uL (ref 0.0–0.1)
Basophils Relative: 0 %
Eosinophils Absolute: 0.1 10*3/uL (ref 0.0–0.5)
Eosinophils Relative: 2 %
HCT: 43.8 % (ref 36.0–46.0)
Hemoglobin: 14.4 g/dL (ref 12.0–15.0)
Immature Granulocytes: 3 %
Lymphocytes Relative: 5 %
Lymphs Abs: 0.1 10*3/uL — ABNORMAL LOW (ref 0.7–4.0)
MCH: 27.4 pg (ref 26.0–34.0)
MCHC: 32.9 g/dL (ref 30.0–36.0)
MCV: 83.4 fL (ref 80.0–100.0)
Monocytes Absolute: 0.1 10*3/uL (ref 0.1–1.0)
Monocytes Relative: 4 %
Neutro Abs: 2.1 10*3/uL (ref 1.7–7.7)
Neutrophils Relative %: 86 %
Platelets: 32 10*3/uL — ABNORMAL LOW (ref 150–400)
RBC: 5.25 MIL/uL — ABNORMAL HIGH (ref 3.87–5.11)
RDW: 15.9 % — ABNORMAL HIGH (ref 11.5–15.5)
WBC: 2.5 10*3/uL — ABNORMAL LOW (ref 4.0–10.5)
nRBC: 0.8 % — ABNORMAL HIGH (ref 0.0–0.2)

## 2019-06-11 LAB — BASIC METABOLIC PANEL
Anion gap: 11 (ref 5–15)
BUN: 15 mg/dL (ref 8–23)
CO2: 24 mmol/L (ref 22–32)
Calcium: 7.2 mg/dL — ABNORMAL LOW (ref 8.9–10.3)
Chloride: 100 mmol/L (ref 98–111)
Creatinine, Ser: 0.42 mg/dL — ABNORMAL LOW (ref 0.44–1.00)
GFR calc Af Amer: >60 mL/min (ref >60)
GFR calc non Af Amer: >60 mL/min (ref >60)
Glucose, Bld: 194 mg/dL — ABNORMAL HIGH (ref 70–99)
Potassium: 3.4 mmol/L — ABNORMAL LOW (ref 3.5–5.1)
Sodium: 135 mmol/L (ref 135–145)

## 2019-06-11 MED ORDER — SODIUM CHLORIDE 0.9% FLUSH
10.0000 mL | Freq: Once | INTRAVENOUS | Status: AC
  Administered 2019-06-11: 10 mL via INTRAVENOUS
  Filled 2019-06-11: qty 10

## 2019-06-11 MED ORDER — CALCIUM GLUCONATE-NACL 1-0.675 GM/50ML-% IV SOLN
1.0000 g | Freq: Once | INTRAVENOUS | Status: AC
  Administered 2019-06-11: 15:00:00 1000 mg via INTRAVENOUS
  Filled 2019-06-11: qty 50

## 2019-06-11 MED ORDER — SODIUM CHLORIDE 0.9 % IV SOLN
INTRAVENOUS | Status: DC
  Filled 2019-06-11: qty 250

## 2019-06-11 MED ORDER — HEPARIN SOD (PORK) LOCK FLUSH 100 UNIT/ML IV SOLN
500.0000 [IU] | Freq: Once | INTRAVENOUS | Status: AC
  Administered 2019-06-11: 500 [IU] via INTRAVENOUS
  Filled 2019-06-11: qty 5

## 2019-06-11 MED ORDER — HEPARIN SOD (PORK) LOCK FLUSH 100 UNIT/ML IV SOLN
INTRAVENOUS | Status: AC
  Filled 2019-06-11: qty 5

## 2019-06-11 MED ORDER — OSIMERTINIB MESYLATE 80 MG PO TABS: 80.0000 mg | ORAL_TABLET | Freq: Every day | ORAL | 10 refills | Status: DC

## 2019-06-11 MED ORDER — MAGNESIUM SULFATE 2 GM/50ML IV SOLN: 2.0000 g | Freq: Once | INTRAVENOUS | Status: DC

## 2019-06-11 MED ORDER — SODIUM CHLORIDE 0.9 % IV SOLN: 2.0000 g | Freq: Once | INTRAVENOUS | Status: DC

## 2019-06-11 MED ORDER — SODIUM CHLORIDE 0.9 % IV SOLN: 1.0000 g | Freq: Once | INTRAVENOUS | Status: DC

## 2019-06-11 NOTE — Telephone Encounter (Signed)
Oral Oncology Patient Advocate Encounter   Received notification from [employer] that prior authorization for Tagrisso is required.   PA submitted on CoverMyMeds Key [id number] Status is pending   Oral Oncology Clinic will continue to follow.  [place] Patient Megan Shelton Phone [phone] Fax [phone] 06/12/2019 2:55 PM

## 2019-06-11 NOTE — Progress Notes (Signed)
  Oncology Nurse Navigator Documentation  Navigator Location: [facility]Med Onc (06/11/19 1500)   )Navigator Encounter Type: Follow-up Appt (06/11/19 1500)                     Patient Visit Type: MedOnc;Nadir Check (06/11/19 1500)   Barriers/Navigation Needs: Coordination of Care;Education (06/11/19 1500) Education: Understanding Cancer/ Treatment Options (06/11/19 1500) Interventions: Coordination of Care;Education (06/11/19 1500)   Coordination of Care: Appts (06/11/19 1500) Education Method: Written (06/11/19 1500)       met with patient during follow up visit with Dr. Janese Banks to discuss results from molecular testing. All questions answered during visit. Pt given resources regarding targeted therapy. Copy of Omniseq results given to pt as well. Pt instructed to call with any further questions or needs. Pt verbalized understanding.          Time Spent with Patient: 45 (06/11/19 1500)

## 2019-06-11 NOTE — Patient Instructions (Signed)
Start taking Calcium 1200mg  daily  Hold Eliquis until otherwise directed by Dr. Marchia Meiers will initially start in 2 weeks after you have recovered from the recent chemotherapy treatment.

## 2019-06-11 NOTE — Telephone Encounter (Signed)
Oral Oncology Pharmacist Encounter  Received new prescription for Tagrisso (osimertinib) for the treatment of metastatic lung cancer EGFR mutation positive, planned duration until disease progression or unacceptable drug toxicity.   [name] received was initially started on pembrolizumab but her OmniSeq returned showing a EGFR mutation and now her treatment is being transitioned. There is a reported increased in severe immune-related adverse events with sequential PD-L1 blockade and osimertinib (Schoenfeld, et al. Lelon Frohlich Oncol 2019). [name] received only one dose of pembrolizumab. She should be monitored for immune-related adverse event. The study showed a median onset of 20 days after osimertinib.  ECG from 05/18/19 showed a QTC of 496m. Prescription dose and frequency assessed.   Current medication list in Epic reviewed, no relevant DDIs with osimertinib identified.  [facility].  Oral Oncology Clinic will continue to follow for insurance authorization, copayment issues, initial counseling and start date.  ADarl Pikes PharmD, BCPS, BCrisp Regional HospitalHematology/Oncology Clinical Pharmacist [phone] 06/11/2019 9:48 AM

## 2019-06-11 NOTE — Progress Notes (Signed)
Patient is here for follow up she is with her son he had some questions about a medication that was sent to the pharmacy, had questions about lab work.

## 2019-06-11 NOTE — Telephone Encounter (Signed)
I called pt to let her know that rao looked into swelling and her hydrea and she said to stop hyrdrea today. Cancel the appts we made for her this am and it will only be 1 month  4/2 at 9:45. She is agreable to the new appt and knows the ones given earlier today to dismiss it. She will stop hydrea after today because she already took I this am.

## 2019-06-12 ENCOUNTER — Ambulatory Visit: Payer: Medicare Other | Admitting: Radiation Oncology

## 2019-06-12 NOTE — Addendum Note (Signed)
Addended by: Faythe Casa E on: 06/12/2019 01:51 PM   Modules accepted: Level of Service

## 2019-06-12 NOTE — Telephone Encounter (Signed)
Oral Oncology Patient Advocate Encounter  Prior Authorization for [employer]ip has been approved.    PA# [id number] Effective dates: 06/12/2019 through 04/08/2020  Patients co-pay is $2533.13.  Due to high copay, we will apply for patient assistance through [employer] since no grant foundations are open.  Oral Oncology Clinic will continue to follow.   [place] Patient [place] Phone [phone] Fax [phone] 06/12/2019 2:57 PM

## 2019-06-12 NOTE — Progress Notes (Signed)
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 She was also a chronic smoker but quit smoking in 2006. More recently patient presented with pain in her left arm which led to an x-ray. X-ray showed comminuted possibly pathologic midshaft humeral fracture. This was followed by a whole-body bone scan which showed multiple sites of abnormal tracer uptake involving left scapula, bilateral posterior ribs, thoracic and lumbar spine, pelvis and multiple sites in the proximal and distal left femur as well as left humeral diaphysis.  PET CT scan shows 4 cm left retrohilar mass in the upper lobe but invading across the major fissure into the left lower lobe with an SUV of 9.1. Scattered osseous metastasis with humeral pathological fracture.  Mri brain showed brain mets  She completed palliative WBRT and palliative RT to left arm and right hip  Acetabular biopsy showed adenocarcinoma. NGS panel which came back after 1 cycle of chemotherapy showed EGFR C2573T >G (L858R). PDL1 2%  Interval history-feels fatigued after 1 cycle of chemotherapy.  Reports some mental slowing.  She has not had any falls.  Denies any significant nausea or vomiting  ECOG PS- 1 Pain scale- 0 Opioid associated constipation-  no  Review of systems- Review of Systems  Constitutional: Positive for malaise/fatigue. Negative for chills, fever and weight loss.  HENT: Negative for congestion, ear discharge and nosebleeds.   Eyes: Negative for blurred vision.  Respiratory: Negative for cough, hemoptysis, sputum production, shortness of breath and wheezing.   Cardiovascular: Negative for chest pain, palpitations, orthopnea and claudication.  Gastrointestinal: Negative for abdominal pain, blood in stool, constipation, diarrhea, heartburn, melena, nausea and vomiting.  Genitourinary: Negative for dysuria, flank pain, frequency, hematuria and urgency.  Musculoskeletal: Negative for back pain, joint pain and myalgias.  Skin: Negative for rash.  Neurological: Negative for dizziness, tingling, focal weakness, seizures, weakness and headaches.  Endo/Heme/Allergies: Does not bruise/bleed easily.  Psychiatric/Behavioral: Negative for depression and suicidal ideas. The patient does not have insomnia.      Allergies  Allergen Reactions   Latex Rash   Nickel Rash     Past Medical History:  Diagnosis Date   AICD (automatic cardioverter/defibrillator) present    Anxiety    CHF (congestive heart failure) (HCC)    Diabetes mellitus with neuropathy ([place])    Dyspnea    Hypertension    Hypertensive heart disease    Implantable cardioverter-defibrillator (ICD)-[employer]tr[employer]    Myocardial infarction, anterior wall (HCC)    Obesity, Class II, BMI 35-39.9      Past Surgical History:  Procedure Laterality Date   CARDIOVERSION N/A 05/12/2019   Procedure: CARDIOVERSION;  Surgeon: Nelva Bush, MD;  Location: [facility] ORS;  Service: Cardiovascular;  Laterality: N/A;   CARPAL TUNNEL RELEASE     cataract surgery     EYE SURGERY     FRACTURE SURGERY Left    ARM   JOINT  REPLACEMENT Left    tkr   PORTA CATH INSERTION N/A 05/14/2019   Procedure: PORTA CATH INSERTION;  Surgeon: Algernon Huxley, MD;  Location: [facility];  Service: Cardiovascular;  Laterality: N/A;   VIDEO BRONCHOSCOPY WITH ENDOBRONCHIAL ULTRASOUND N/A 05/11/2019   Procedure: VIDEO BRONCHOSCOPY WITH ENDOBRONCHIAL ULTRASOUND;  Surgeon: Tyler Pita, MD;  Location: [facility] ORS;  Service: Pulmonary;  Laterality: N/A;    Social History   Socioeconomic History   Marital status: Widowed    Spouse name: Not on file   Number of children: 3   Years of education: Not on file   Highest education level: High school graduate  Occupational History   Occupation: retired  Tobacco Use   Smoking status: Former Smoker    Packs/day: 1.00    Years: 55.00    Pack years: 55.00    Types: Cigarettes    Quit date: 2006    Years since quitting: 15.1   Smokeless tobacco: Never Used   Tobacco comment: quit about 18 years ago; as of 2019  Substance and Sexual Activity   Alcohol use: No   Drug use: No   Sexual activity: Not on file  Other Topics Concern   Not on file  Social History Narrative   Not on file   Social Determinants of Health   Financial Resource Strain:    Difficulty of Paying Living Expenses: Not on file  Food Insecurity:    Worried About Charity fundraiser in the Last Year: Not on file   [employer] of Food in the Last Year: Not on file  Transportation Needs:    Lack of Transportation (Medical): Not on file   Lack of Transportation (Non-Medical): Not on file  Physical Activity: Inactive   Days of Exercise per Week: 0 days   Minutes of Exercise per Session: 0 min  Stress: No Stress Concern Present   Feeling of Stress : Not at all  Social Connections: Unknown   Frequency of Communication with Friends and Family: Patient refused   Frequency of Social Gatherings with Friends and Family: Patient refused   Attends Religious Services: Patient refused   Printmaker: Patient refused   Attends Music therapist: Patient refused   Marital Status: Patient  refused  Intimate Production manager Violence: Unknown   Fear of Current or Ex-Partner: Patient refused   Emotionally Abused: Patient refused   Physically Abused: Patient refused   Sexually Abused: Patient refused    Family History  Problem Relation Age of Onset   Scleroderma Mother    Raynaud syndrome Mother    Cancer Father 79       colon cancer   Heart disease Brother        died of heart attack   Diabetes Brother      Current Outpatient Medications:    apixaban (ELIQUIS) 5 MG TABS tablet, Take 1 tablet (5 mg total) by mouth 2 (two) times daily., Disp: 180 tablet, Rfl: 1   atorvastatin (LIPITOR) 40 MG tablet, TAKE 1 TABLET BY MOUTH  DAILY (Patient taking differently: Take 40 mg by mouth daily. ), Disp: 90 tablet, Rfl: 3   Blood Glucose Monitoring Suppl (ACCU-CHEK COMPACT CARE KIT) KIT, To check blood sugar daily., Disp: 1 kit, Rfl: 0   Blood Glucose Monitoring Suppl (ACCU-CHEK GUIDE) w/Device KIT, , Disp: , Rfl:    clonazePAM (KLONOPIN) 1 MG tablet, TAKE 1 TABLET BY MOUTH THREE TIMES A DAY AS NEEDED  FOR ANXIETY, Disp: 90 tablet, Rfl: 5   dexamethasone (DECADRON) 4 MG tablet, Take 1 tab two times a day the day before Alimta chemo, then take 2 tabs once a day for 3 days starting the day after chemo., Disp: 30 tablet, Rfl: 1   folic acid (FOLVITE) 1 MG tablet, Take 1 tablet (1 mg total) by mouth daily. Start 5-7 days before Alimta chemotherapy. Continue until 21 days after Alimta completed., Disp: 100 tablet, Rfl: 3   glimepiride (AMARYL) 2 MG tablet, Take 1 tablet (2 mg total) by mouth 2 (two) times daily., Disp: 180 tablet, Rfl: 1   glucose blood (ACCU-CHEK COMPACT PLUS) test strip, To check blood sugar daily., Disp: 100 each, Rfl: 12   hydrocortisone 1 % ointment, Apply 1 application topically 2 (two) times daily. May use for skin irritation on bilateral eyelids. Do not get inside of eyes. (Patient taking differently: Apply 1 application topically 2 (two) times daily as  needed for itching. May use for skin irritation on bilateral eyelids. Do not get inside of eyes.), Disp: 30 g, Rfl: 0   Lancets (ACCU-CHEK SOFT TOUCH) lancets, To check blood sugar daily., Disp: 100 each, Rfl: 12   levothyroxine (SYNTHROID) 100 MCG tablet, Take 1 tablet (100 mcg total) by mouth daily at 6 (six) AM., Disp: 90 tablet, Rfl: 1   lidocaine-prilocaine (EMLA) cream, Apply to affected area once, Disp: 30 g, Rfl: 3   lisinopril (ZESTRIL) 10 MG tablet, TAKE 1 TABLET BY MOUTH  DAILY (Patient taking differently: Take 10 mg by mouth daily. ), Disp: 90 tablet, Rfl: 3   LORazepam (ATIVAN) 0.5 MG tablet, Take 1 tablet (0.5 mg total) by mouth every 6 (six) hours as needed (Nausea or vomiting)., Disp: 30 tablet, Rfl: 0   magic mouthwash w/lidocaine SOLN, Take 5 mLs by mouth 4 (four) times daily., Disp: 240 mL, Rfl: 0   metFORMIN (GLUCOPHAGE-XR) 500 MG 24 hr tablet, Take 1 tablet (500 mg total) by mouth 2 (two) times daily., Disp: 180 tablet, Rfl: 1   metoprolol tartrate (LOPRESSOR) 50 MG tablet, Take 1 tablet (50 mg total) by mouth 2 (two) times daily., Disp: 180 tablet, Rfl: 1   ondansetron (ZOFRAN) 8 MG tablet, Take 1 tablet (8 mg total) by mouth 2 (two) times daily as needed (Nausea or vomiting). Start if needed on the third day after chemotherapy., Disp: 30 tablet, Rfl: 1   ONETOUCH DELICA LANCETS 17G MISC, CHECK BLOOD SUGAR ONCE  DAILY, Disp: 100 each, Rfl: 11   osimertinib mesylate (TAGRISSO) 80 MG tablet, Take 1 tablet (80 mg total) by mouth daily., Disp: 30 tablet, Rfl: 10   oxyCODONE (OXY IR/ROXICODONE) 5 MG immediate release tablet, Take 1 tablet (5 mg total) by mouth every 4 (four) hours as needed for severe pain., Disp: 60 tablet, Rfl: 0   prochlorperazine (COMPAZINE) 10 MG tablet, Take 1 tablet (10 mg total) by mouth every 6 (six) hours as needed (Nausea or vomiting)., Disp: 30 tablet, Rfl: 1   pyridOXINE (VITAMIN B-6) 100 MG tablet, Take 100 mg by mouth daily., Disp: , Rfl:     sitaGLIPtin (JANUVIA) 100 MG tablet, Take 100 mg by mouth daily., Disp: , Rfl:    vitamin B-12 (CYANOCOBALAMIN) 100 MCG tablet, Take 100 mcg by mouth daily., Disp: , Rfl:    albuterol (VENTOLIN HFA) 108 (90 Base) MCG/ACT inhaler, Inhale 1 puff into the lungs every 6 (six) hours as needed for wheezing or shortness of breath. (Patient not taking: Reported on 06/04/2019),  Disp: 18 g, Rfl: 8   furosemide (LASIX) 40 MG tablet, Take 1 tablet (40 mg total) by mouth daily as needed for fluid. (Patient not taking: Reported on 06/04/2019), Disp: 90 tablet, Rfl: 1   ondansetron (ZOFRAN ODT) 4 MG disintegrating tablet, Take 1 tablet (4 mg total) by mouth every 8 (eight) hours as needed for nausea or vomiting. (Patient not taking: Reported on 06/04/2019), Disp: 60 tablet, Rfl: 1  Physical exam:  Vitals:   06/11/19 1328  BP: 115/71  Pulse: 80  Resp: 20  Temp: 97.7 F (36.5 C)  TempSrc: Tympanic  SpO2: 95%  Weight: 151 lb 4.8 oz (68.6 kg)   Physical Exam HENT:     Head: Normocephalic and atraumatic.  Eyes:     Pupils: Pupils are equal, round, and reactive to light.  Cardiovascular:     Rate and Rhythm: Normal rate and regular rhythm.     Heart sounds: Normal heart sounds.  Pulmonary:     Effort: Pulmonary effort is normal.     Breath sounds: Normal breath sounds.  Abdominal:     General: Bowel sounds are normal.     Palpations: Abdomen is soft.  Musculoskeletal:     Cervical back: Normal range of motion.  Skin:    General: Skin is warm and dry.  Neurological:     Mental Status: She is alert and oriented to person, place, and time.      CMP Latest Ref Rng & Units 06/11/2019  Glucose 70 - 99 mg/dL 194(H)  BUN 8 - 23 mg/dL 15  Creatinine 0.44 - 1.00 mg/dL 0.42(L)  Sodium 135 - 145 mmol/L 135  Potassium 3.5 - 5.1 mmol/L 3.4(L)  Chloride 98 - 111 mmol/L 100  CO2 22 - 32 mmol/L 24  Calcium 8.9 - 10.3 mg/dL 7.2(L)  Total Protein 6.5 - 8.1 g/dL -  Total Bilirubin 0.3 - 1.2 mg/dL -   Alkaline Phos 38 - 126 U/L -  AST 15 - 41 U/L -  ALT 0 - 44 U/L -   CBC Latest Ref Rng & Units 06/11/2019  WBC 4.0 - 10.5 K/uL 2.5(L)  Hemoglobin 12.0 - 15.0 g/dL 14.4  Hematocrit 36.0 - 46.0 % 43.8  Platelets 150 - 400 K/uL 32(L)    No images are attached to the encounter.  PERIPHERAL VASCULAR CATHETERIZATION  Result Date: 05/14/2019 See op note  CT BIOPSY  Result Date: 05/13/2019 INDICATION: 79 year old with a left lung lesion and multiple bone lesions. Tissue diagnosis is needed. EXAM: CT-GUIDED CORE BIOPSY OF LEFT ACETABULAR BONE LESION MEDICATIONS: None. ANESTHESIA/SEDATION: Moderate (conscious) sedation was employed during this procedure. A total of Versed 2.0 mg and Fentanyl 50 mcg was administered intravenously. Moderate Sedation Time: 35 minutes. The patient's level of consciousness and vital signs were monitored continuously by radiology nursing throughout the procedure under my direct supervision. FLUOROSCOPY TIME:  None COMPLICATIONS: None immediate. PROCEDURE: Informed written consent was obtained from the patient after a thorough discussion of the procedural risks, benefits and alternatives. All questions were addressed. A timeout was performed prior to the initiation of the procedure. Patient was placed supine on the CT scanner. Images through the pelvis were obtained. The lucent lesion in the superior left acetabulum was identified. The overlying skin was prepped with chlorhexidine and sterile field was created. Skin and soft tissues were anesthetized with 1% lidocaine. Using CT guidance, 17 gauge coaxial needle was directed into the lucent lesion through a small cortical bone defect. Needle position was confirmed within  the lucent lesion. An 18 gauge core biopsy was obtained with a BioPince device. Core biopsy was predominantly bone and the coaxial needle had to be completely removed in order to obtain the specimen. An 11 gauge bone needle was then directed into the lesion at the  area of the cortical defect. Additional core biopsies were obtained with the coaxial bone biopsy device. An additional small bone core and blood clot was obtained. The small bone core specimens and blood clot were placed in formalin. Follow up CT images were obtained. Bandage placed over the puncture site. FINDINGS: Lucent lesion involving superior left acetabulum. Needles were directed into the lesion through the small cortical defect. Combination of small bone cores and blood clot were obtained. IMPRESSION: CT-guided core biopsies of the left acetabular bone lesion. Electronically Signed   By: Markus Daft M.D.   On: 05/13/2019 17:14     Assessment and plan- Patient is a 79 y.o. 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  Patient is roughly 1 week post chemotherapy and her platelet counts are down to 32.  She does not require platelet transfusion at this time but we will continue to monitor her platelet counts.  She will hold off on taking her Eliquis which she took for recent episode of SVT until her platelet count is greater than 50.  2.  Chemo-induced neutropenia: Patient is afebrile.  Continue to monitor.  Counts are likely to recover in the next 2 weeks.  3.  Hypocalcemia: She is s/p 1 dose of Xgeva and we will give her IV calcium today and I also recommended that she should take 1200 mg of oral calcium daily.  Depending on her calcium levels at 3 weeks we will consider holding Xgeva if her calcium is not sufficiently high  4.  EGFR positive stage IV lung cancer: Patient was given 1 dose of carboplatin Alimta and Keytruda given the burden of her disease.  NGS testing does show EGFR mutation that she would be a candidate for osimertinib.  I will therefore hold off on giving any further chemotherapy at this time and transition her to osimertinib after 2 weeks. I  discussed the results of the phase 3 FAURA trial which compared second-generation osimertinib versus standard of care gefitinib.  Duration of response was better with osimertinib 17.2 months versus 8.5 months and PFS was better at 18.9 months was better with 10.2 months.  Discussed risks and benefits of osimertinib including all but not limited to fatigue, skin rash, diarrhea, need to monitor liver and kidney functions.  It can also cause prolonged QT interval and we will plan to check an EKG at next visit in 2 weeks.  Tagrisso can also cause decreased EF in 4% of the cases.  Her baseline echocardiogram showed EF of 45 to 50% and I will monitor that regularly every 2 to 3 months.  Treatment will be given with a palliative intent  Patient has received 1 dose of Keytruda and there is an increased risk of pneumonitis with osimertinib following immunotherapy.  I will monitor for any signs and symptoms of respiratory distress closely  5.  Neoplasm related pain: Continue as needed oxycodone  I will wait for her counts to recover over the next 2 weeks and see her back in 2 weeks to start osimertinib.  Also check CBC with differential and CMP in 1  week   Visit Diagnosis 1. Chemotherapy-induced thrombocytopenia   2. Chemotherapy induced neutropenia (HCC)   3. Stage 4 malignant neoplasm of lung, unspecified laterality ([place])   4. Hypocalcemia   5. Neoplasm related pain      Dr. Randa Evens, MD, MPH [facility] [phone] 06/12/2019 8:43 AM

## 2019-06-15 ENCOUNTER — Other Ambulatory Visit: Payer: Self-pay

## 2019-06-15 ENCOUNTER — Telehealth: Payer: Self-pay | Admitting: *Deleted

## 2019-06-15 ENCOUNTER — Emergency Department: Payer: Medicare Other

## 2019-06-15 ENCOUNTER — Inpatient Hospital Stay: Payer: Medicare Other

## 2019-06-15 ENCOUNTER — Inpatient Hospital Stay
Admission: EM | Admit: 2019-06-15 | Discharge: 2019-06-19 | DRG: 080 | Disposition: A | Discharge status: Home Health | Payer: Medicare Other | Attending: Internal Medicine | Admitting: Internal Medicine

## 2019-06-15 ENCOUNTER — Other Ambulatory Visit: Payer: Self-pay | Admitting: *Deleted

## 2019-06-15 ENCOUNTER — Encounter: Payer: Self-pay | Admitting: Emergency Medicine

## 2019-06-15 ENCOUNTER — Inpatient Hospital Stay (HOSPITAL_BASED_OUTPATIENT_CLINIC_OR_DEPARTMENT_OTHER): Payer: Medicare Other | Admitting: Nurse Practitioner

## 2019-06-15 ENCOUNTER — Other Ambulatory Visit: Payer: Self-pay | Admitting: Nurse Practitioner

## 2019-06-15 ENCOUNTER — Encounter: Payer: Self-pay | Admitting: Nurse Practitioner

## 2019-06-15 VITALS — BP 119/60 | HR 75 | Temp 97.6°F | Resp 18

## 2019-06-15 DIAGNOSIS — E785 Hyperlipidemia, unspecified: Secondary | ICD-10-CM | POA: Diagnosis present

## 2019-06-15 DIAGNOSIS — C7951 Secondary malignant neoplasm of bone: Secondary | ICD-10-CM | POA: Diagnosis present

## 2019-06-15 DIAGNOSIS — C3490 Malignant neoplasm of unspecified part of unspecified bronchus or lung: Secondary | ICD-10-CM | POA: Diagnosis not present

## 2019-06-15 DIAGNOSIS — R509 Fever, unspecified: Secondary | ICD-10-CM

## 2019-06-15 DIAGNOSIS — R300 Dysuria: Secondary | ICD-10-CM

## 2019-06-15 DIAGNOSIS — E1165 Type 2 diabetes mellitus with hyperglycemia: Secondary | ICD-10-CM | POA: Diagnosis not present

## 2019-06-15 DIAGNOSIS — Z95828 Presence of other vascular implants and grafts: Secondary | ICD-10-CM

## 2019-06-15 DIAGNOSIS — Z91040 Latex allergy status: Secondary | ICD-10-CM | POA: Diagnosis not present

## 2019-06-15 DIAGNOSIS — I255 Ischemic cardiomyopathy: Secondary | ICD-10-CM | POA: Diagnosis not present

## 2019-06-15 DIAGNOSIS — Z960 Presence of urogenital implants: Secondary | ICD-10-CM | POA: Diagnosis present

## 2019-06-15 DIAGNOSIS — Z800 Family history of malignant neoplasm of digestive organs: Secondary | ICD-10-CM

## 2019-06-15 DIAGNOSIS — R339 Retention of urine, unspecified: Secondary | ICD-10-CM | POA: Diagnosis not present

## 2019-06-15 DIAGNOSIS — E876 Hypokalemia: Secondary | ICD-10-CM

## 2019-06-15 DIAGNOSIS — D61818 Other pancytopenia: Secondary | ICD-10-CM | POA: Diagnosis not present

## 2019-06-15 DIAGNOSIS — C7931 Secondary malignant neoplasm of brain: Secondary | ICD-10-CM | POA: Diagnosis not present

## 2019-06-15 DIAGNOSIS — R29818 Other symptoms and signs involving the nervous system: Secondary | ICD-10-CM | POA: Diagnosis not present

## 2019-06-15 DIAGNOSIS — R4182 Altered mental status, unspecified: Secondary | ICD-10-CM | POA: Insufficient documentation

## 2019-06-15 DIAGNOSIS — R410 Disorientation, unspecified: Secondary | ICD-10-CM | POA: Diagnosis not present

## 2019-06-15 DIAGNOSIS — I110 Hypertensive heart disease with heart failure: Secondary | ICD-10-CM | POA: Diagnosis present

## 2019-06-15 DIAGNOSIS — Z20822 Contact with and (suspected) exposure to covid-19: Secondary | ICD-10-CM | POA: Diagnosis present

## 2019-06-15 DIAGNOSIS — Z833 Family history of diabetes mellitus: Secondary | ICD-10-CM

## 2019-06-15 DIAGNOSIS — G9341 Metabolic encephalopathy: Secondary | ICD-10-CM | POA: Diagnosis not present

## 2019-06-15 DIAGNOSIS — I252 Old myocardial infarction: Secondary | ICD-10-CM

## 2019-06-15 DIAGNOSIS — I2510 Atherosclerotic heart disease of native coronary artery without angina pectoris: Secondary | ICD-10-CM | POA: Diagnosis present

## 2019-06-15 DIAGNOSIS — Z7984 Long term (current) use of oral hypoglycemic drugs: Secondary | ICD-10-CM

## 2019-06-15 DIAGNOSIS — Z978 Presence of other specified devices: Secondary | ICD-10-CM

## 2019-06-15 DIAGNOSIS — Z9221 Personal history of antineoplastic chemotherapy: Secondary | ICD-10-CM

## 2019-06-15 DIAGNOSIS — E119 Type 2 diabetes mellitus without complications: Secondary | ICD-10-CM

## 2019-06-15 DIAGNOSIS — F411 Generalized anxiety disorder: Secondary | ICD-10-CM | POA: Diagnosis present

## 2019-06-15 DIAGNOSIS — T451X5A Adverse effect of antineoplastic and immunosuppressive drugs, initial encounter: Secondary | ICD-10-CM | POA: Diagnosis not present

## 2019-06-15 DIAGNOSIS — Z95810 Presence of automatic (implantable) cardiac defibrillator: Secondary | ICD-10-CM

## 2019-06-15 DIAGNOSIS — R5381 Other malaise: Secondary | ICD-10-CM | POA: Diagnosis present

## 2019-06-15 DIAGNOSIS — R000 Tachycardia, unspecified: Secondary | ICD-10-CM | POA: Diagnosis present

## 2019-06-15 DIAGNOSIS — Z9009 Acquired absence of other part of head and neck: Secondary | ICD-10-CM

## 2019-06-15 DIAGNOSIS — Z66 Do not resuscitate: Secondary | ICD-10-CM | POA: Diagnosis present

## 2019-06-15 DIAGNOSIS — Z8679 Personal history of other diseases of the circulatory system: Secondary | ICD-10-CM

## 2019-06-15 DIAGNOSIS — Z87891 Personal history of nicotine dependence: Secondary | ICD-10-CM

## 2019-06-15 DIAGNOSIS — E1140 Type 2 diabetes mellitus with diabetic neuropathy, unspecified: Secondary | ICD-10-CM | POA: Diagnosis not present

## 2019-06-15 DIAGNOSIS — Z79890 Hormone replacement therapy (postmenopausal): Secondary | ICD-10-CM

## 2019-06-15 DIAGNOSIS — I10 Essential (primary) hypertension: Secondary | ICD-10-CM | POA: Diagnosis present

## 2019-06-15 DIAGNOSIS — I480 Paroxysmal atrial fibrillation: Secondary | ICD-10-CM | POA: Diagnosis present

## 2019-06-15 DIAGNOSIS — G936 Cerebral edema: Secondary | ICD-10-CM | POA: Diagnosis not present

## 2019-06-15 DIAGNOSIS — D6959 Other secondary thrombocytopenia: Secondary | ICD-10-CM | POA: Diagnosis present

## 2019-06-15 DIAGNOSIS — Z9889 Other specified postprocedural states: Secondary | ICD-10-CM

## 2019-06-15 DIAGNOSIS — Z96652 Presence of left artificial knee joint: Secondary | ICD-10-CM | POA: Diagnosis present

## 2019-06-15 DIAGNOSIS — I5022 Chronic systolic (congestive) heart failure: Secondary | ICD-10-CM | POA: Diagnosis not present

## 2019-06-15 DIAGNOSIS — Z8249 Family history of ischemic heart disease and other diseases of the circulatory system: Secondary | ICD-10-CM

## 2019-06-15 DIAGNOSIS — R531 Weakness: Secondary | ICD-10-CM

## 2019-06-15 DIAGNOSIS — Z79899 Other long term (current) drug therapy: Secondary | ICD-10-CM

## 2019-06-15 DIAGNOSIS — E890 Postprocedural hypothyroidism: Secondary | ICD-10-CM | POA: Diagnosis not present

## 2019-06-15 DIAGNOSIS — Z9109 Other allergy status, other than to drugs and biological substances: Secondary | ICD-10-CM | POA: Diagnosis not present

## 2019-06-15 DIAGNOSIS — T380X5A Adverse effect of glucocorticoids and synthetic analogues, initial encounter: Secondary | ICD-10-CM | POA: Diagnosis not present

## 2019-06-15 DIAGNOSIS — Z7901 Long term (current) use of anticoagulants: Secondary | ICD-10-CM

## 2019-06-15 LAB — COMPREHENSIVE METABOLIC PANEL
ALT: 32 U/L (ref 0–44)
AST: 30 U/L (ref 15–41)
Albumin: 2.6 g/dL — ABNORMAL LOW (ref 3.5–5.0)
Alkaline Phosphatase: 171 U/L — ABNORMAL HIGH (ref 38–126)
Anion gap: 10 (ref 5–15)
BUN: 10 mg/dL (ref 8–23)
CO2: 24 mmol/L (ref 22–32)
Calcium: 7 mg/dL — ABNORMAL LOW (ref 8.9–10.3)
Chloride: 97 mmol/L — ABNORMAL LOW (ref 98–111)
Creatinine, Ser: <0.3 mg/dL — ABNORMAL LOW (ref 0.44–1.00)
Glucose, Bld: 143 mg/dL — ABNORMAL HIGH (ref 70–99)
Potassium: 3.2 mmol/L — ABNORMAL LOW (ref 3.5–5.1)
Sodium: 131 mmol/L — ABNORMAL LOW (ref 135–145)
Total Bilirubin: 0.8 mg/dL (ref 0.3–1.2)
Total Protein: 5.8 g/dL — ABNORMAL LOW (ref 6.5–8.1)

## 2019-06-15 LAB — URINALYSIS, COMPLETE (UACMP) WITH MICROSCOPIC
Bacteria, UA: NONE SEEN
Bilirubin Urine: NEGATIVE
Glucose, UA: NEGATIVE mg/dL
Hgb urine dipstick: NEGATIVE
Ketones, ur: NEGATIVE mg/dL
Leukocytes,Ua: NEGATIVE
Nitrite: NEGATIVE
Protein, ur: NEGATIVE mg/dL
Specific Gravity, Urine: 1.015 (ref 1.005–1.030)
pH: 7 (ref 5.0–8.0)

## 2019-06-15 LAB — RESPIRATORY PANEL BY RT PCR (FLU A&B, COVID)
Influenza A by PCR: NEGATIVE
Influenza B by PCR: NEGATIVE
SARS Coronavirus 2 by RT PCR: NEGATIVE

## 2019-06-15 LAB — CBC WITH DIFFERENTIAL/PLATELET
Abs Immature Granulocytes: 0.09 10*3/uL — ABNORMAL HIGH (ref 0.00–0.07)
Basophils Absolute: 0 10*3/uL (ref 0.0–0.1)
Basophils Relative: 0 %
Eosinophils Absolute: 0 10*3/uL (ref 0.0–0.5)
Eosinophils Relative: 1 %
HCT: 40.1 % (ref 36.0–46.0)
Hemoglobin: 13.1 g/dL (ref 12.0–15.0)
Immature Granulocytes: 2 %
Lymphocytes Relative: 4 %
Lymphs Abs: 0.2 10*3/uL — ABNORMAL LOW (ref 0.7–4.0)
MCH: 27 pg (ref 26.0–34.0)
MCHC: 32.7 g/dL (ref 30.0–36.0)
MCV: 82.7 fL (ref 80.0–100.0)
Monocytes Absolute: 0.1 10*3/uL (ref 0.1–1.0)
Monocytes Relative: 3 %
Neutro Abs: 3.5 10*3/uL (ref 1.7–7.7)
Neutrophils Relative %: 90 %
Platelets: 53 10*3/uL — ABNORMAL LOW (ref 150–400)
RBC: 4.85 MIL/uL (ref 3.87–5.11)
RDW: 15.9 % — ABNORMAL HIGH (ref 11.5–15.5)
WBC: 4 10*3/uL (ref 4.0–10.5)
nRBC: 0.8 % — ABNORMAL HIGH (ref 0.0–0.2)

## 2019-06-15 LAB — GLUCOSE, CAPILLARY
Glucose-Capillary: 51 mg/dL — ABNORMAL LOW (ref 70–99)
Glucose-Capillary: 108 mg/dL — ABNORMAL HIGH (ref 70–99)

## 2019-06-15 LAB — MAGNESIUM: Magnesium: 1.8 mg/dL (ref 1.7–2.4)

## 2019-06-15 MED ORDER — SODIUM CHLORIDE 0.9 % IV SOLN: 1.0000 g | Freq: Once | INTRAVENOUS | Status: DC

## 2019-06-15 MED ORDER — DEXTROSE 50 % IV SOLN
1.0000 | Freq: Once | INTRAVENOUS | Status: AC
  Administered 2019-06-15: 50 mL via INTRAVENOUS
  Filled 2019-06-15: qty 50

## 2019-06-15 MED ORDER — SULFAMETHOXAZOLE-TRIMETHOPRIM 800-160 MG PO TABS: 1.0000 | ORAL_TABLET | Freq: Two times a day (BID) | ORAL | 0 refills | Status: DC

## 2019-06-15 MED ORDER — SODIUM CHLORIDE 0.9 % IV BOLUS
500.0000 mL | Freq: Once | INTRAVENOUS | Status: AC
  Administered 2019-06-15: 18:00:00 500 mL via INTRAVENOUS

## 2019-06-15 MED ORDER — ALBUTEROL SULFATE (2.5 MG/3ML) 0.083% IN NEBU: 2.5000 mg | INHALATION_SOLUTION | Freq: Four times a day (QID) | RESPIRATORY_TRACT | Status: DC | PRN

## 2019-06-15 MED ORDER — ONDANSETRON HCL 4 MG/2ML IJ SOLN: 4.0000 mg | Freq: Four times a day (QID) | INTRAMUSCULAR | Status: DC | PRN

## 2019-06-15 MED ORDER — ATORVASTATIN CALCIUM 20 MG PO TABS
40.0000 mg | ORAL_TABLET | Freq: Every day | ORAL | Status: DC
  Administered 2019-06-16 – 2019-06-19 (×4): 40 mg via ORAL
  Filled 2019-06-15 (×4): qty 2

## 2019-06-15 MED ORDER — ACETAMINOPHEN 325 MG PO TABS: 650.0000 mg | ORAL_TABLET | Freq: Four times a day (QID) | ORAL | Status: DC | PRN

## 2019-06-15 MED ORDER — ACETAMINOPHEN 325 MG PO TABS
650.0000 mg | ORAL_TABLET | Freq: Once | ORAL | Status: DC
  Filled 2019-06-15: qty 2

## 2019-06-15 MED ORDER — INSULIN ASPART 100 UNIT/ML ~~LOC~~ SOLN
0.0000 [IU] | Freq: Three times a day (TID) | SUBCUTANEOUS | Status: DC
  Administered 2019-06-16: 09:00:00 3 [IU] via SUBCUTANEOUS
  Administered 2019-06-16 (×2): 8 [IU] via SUBCUTANEOUS
  Administered 2019-06-17: 5 [IU] via SUBCUTANEOUS
  Administered 2019-06-17: 3 [IU] via SUBCUTANEOUS
  Administered 2019-06-17: 12:00:00 11 [IU] via SUBCUTANEOUS
  Administered 2019-06-18 (×2): 5 [IU] via SUBCUTANEOUS
  Administered 2019-06-18: 6 [IU] via SUBCUTANEOUS
  Administered 2019-06-19: 8 [IU] via SUBCUTANEOUS
  Administered 2019-06-19: 08:00:00 5 [IU] via SUBCUTANEOUS
  Filled 2019-06-15 (×11): qty 1

## 2019-06-15 MED ORDER — ONDANSETRON HCL 4 MG PO TABS: 4.0000 mg | ORAL_TABLET | Freq: Four times a day (QID) | ORAL | Status: DC | PRN

## 2019-06-15 MED ORDER — OXYCODONE HCL 5 MG PO TABS: 5.0000 mg | ORAL_TABLET | ORAL | Status: DC | PRN

## 2019-06-15 MED ORDER — DEXAMETHASONE SODIUM PHOSPHATE 10 MG/ML IJ SOLN
10.0000 mg | Freq: Once | INTRAMUSCULAR | Status: AC
  Administered 2019-06-15: 10 mg via INTRAVENOUS
  Filled 2019-06-15: qty 1

## 2019-06-15 MED ORDER — CALCIUM GLUCONATE-NACL 1-0.675 GM/50ML-% IV SOLN
1.0000 g | Freq: Once | INTRAVENOUS | Status: AC
  Administered 2019-06-15: 1000 mg via INTRAVENOUS
  Filled 2019-06-15: qty 50

## 2019-06-15 MED ORDER — DEXAMETHASONE SODIUM PHOSPHATE 10 MG/ML IJ SOLN
6.0000 mg | Freq: Four times a day (QID) | INTRAMUSCULAR | Status: DC
  Administered 2019-06-16 (×3): 6 mg via INTRAVENOUS
  Filled 2019-06-15 (×4): qty 0.6

## 2019-06-15 MED ORDER — IOHEXOL 300 MG/ML  SOLN
75.0000 mL | Freq: Once | INTRAMUSCULAR | Status: AC | PRN
  Administered 2019-06-15: 20:00:00 75 mL via INTRAVENOUS

## 2019-06-15 MED ORDER — LISINOPRIL 10 MG PO TABS
10.0000 mg | ORAL_TABLET | Freq: Every day | ORAL | Status: DC
  Administered 2019-06-17 – 2019-06-19 (×3): 10 mg via ORAL
  Filled 2019-06-15 (×4): qty 1

## 2019-06-15 MED ORDER — SENNOSIDES-DOCUSATE SODIUM 8.6-50 MG PO TABS: 1.0000 | ORAL_TABLET | Freq: Every evening | ORAL | Status: DC | PRN

## 2019-06-15 MED ORDER — SODIUM CHLORIDE 0.9% FLUSH
10.0000 mL | Freq: Once | INTRAVENOUS | Status: AC
  Administered 2019-06-15: 10 mL via INTRAVENOUS
  Filled 2019-06-15: qty 10

## 2019-06-15 MED ORDER — LEVOTHYROXINE SODIUM 100 MCG PO TABS
100.0000 ug | ORAL_TABLET | Freq: Every day | ORAL | Status: DC
  Administered 2019-06-16 – 2019-06-19 (×4): 100 ug via ORAL
  Filled 2019-06-15 (×4): qty 1

## 2019-06-15 MED ORDER — METOPROLOL TARTRATE 50 MG PO TABS
50.0000 mg | ORAL_TABLET | Freq: Two times a day (BID) | ORAL | Status: DC
  Administered 2019-06-16 – 2019-06-18 (×6): 50 mg via ORAL
  Filled 2019-06-15 (×7): qty 1

## 2019-06-15 MED ORDER — APIXABAN 5 MG PO TABS
5.0000 mg | ORAL_TABLET | Freq: Two times a day (BID) | ORAL | Status: DC
  Administered 2019-06-16 – 2019-06-19 (×8): 5 mg via ORAL
  Filled 2019-06-15 (×8): qty 1

## 2019-06-15 MED ORDER — ACETAMINOPHEN 650 MG RE SUPP: 650.0000 mg | Freq: Four times a day (QID) | RECTAL | Status: DC | PRN

## 2019-06-15 MED ORDER — SODIUM CHLORIDE 0.9 % IV SOLN
Freq: Once | INTRAVENOUS | Status: AC
  Filled 2019-06-15: qty 250

## 2019-06-15 NOTE — ED Notes (Signed)
Pt had urine and labs done this morning at appointment. Results available in Result viewer

## 2019-06-15 NOTE — ED Notes (Signed)
Pt is resting in bed, and appears to be comfortable. Respirations are equal and unlabored, no signs of acute distress. Pt son at bedside.

## 2019-06-15 NOTE — Telephone Encounter (Signed)
Megan Shelton- if she is febrile, we cannot see her in our clinic I would suggest she should go to the ER

## 2019-06-15 NOTE — Telephone Encounter (Signed)
Pt scheduled in [facility] today at 10:30 to check labs and see Ander Purpura, NP. Pt's son made aware.

## 2019-06-15 NOTE — Patient Instructions (Addendum)
Indwelling Urinary Catheter Care, Adult An indwelling urinary catheter is a thin tube that is put into your bladder. The tube helps to drain pee (urine) out of your body. The tube goes in through your urethra. Your urethra is where pee comes out of your body. Your pee will come out through the catheter, then it will go into a bag (drainage bag). Take good care of your catheter so it will work well. How to wear your catheter and bag Supplies needed  Sticky tape (adhesive tape) or a leg strap.  Alcohol wipe or soap and water (if you use tape).  A clean towel (if you use tape).  Large overnight bag.  Smaller bag (leg bag). Wearing your catheter Attach your catheter to your leg with tape or a leg strap.  Make sure the catheter is not pulled tight.  If a leg strap gets wet, take it off and put on a dry strap.  If you use tape to hold the bag on your leg: 1. Use an alcohol wipe or soap and water to wash your skin where the tape made it sticky before. 2. Use a clean towel to pat-dry that skin. 3. Use new tape to make the bag stay on your leg. Wearing your bags You should have been given a large overnight bag.  You may wear the overnight bag in the day or night.  Always have the overnight bag lower than your bladder.  Do not let the bag touch the floor.  Before you go to sleep, put a clean plastic bag in a wastebasket. Then hang the overnight bag inside the wastebasket. You should also have a smaller leg bag that fits under your clothes.  Always wear the leg bag below your knee.  Do not wear your leg bag at night. How to care for your skin and catheter Supplies needed  A clean washcloth.  Water and mild soap.  A clean towel. Caring for your skin and catheter      Clean the skin around your catheter every day: 1. Wash your hands with soap and water. 2. Wet a clean washcloth in warm water and mild soap. 3. Clean the skin around your urethra.  If you are  female:  Gently spread the folds of skin around your vagina (labia).  With the washcloth in your other hand, wipe the inner side of your labia on each side. Wipe from front to back.  If you are female:  Pull back any skin that covers the end of your penis (foreskin).  With the washcloth in your other hand, wipe your penis in small circles. Start wiping at the tip of your penis, then move away from the catheter.  Move the foreskin back in place, if needed. 4. With your free hand, hold the catheter close to where it goes into your body.  Keep holding the catheter during cleaning so it does not get pulled out. 5. With the washcloth in your other hand, clean the catheter.  Only wipe downward on the catheter.  Do not wipe upward toward your body. Doing this may push germs into your urethra and cause infection. 6. Use a clean towel to pat-dry the catheter and the skin around it. Make sure to wipe off all soap. 7. Wash your hands with soap and water.  Shower every day. Do not take baths.  Do not use cream, ointment, or lotion on the area where the catheter goes into your body, unless your doctor tells you   to.  Do not use powders, sprays, or lotions on your genital area.  Check your skin around the catheter every day for signs of infection. Check for: ? Redness, swelling, or pain. ? Fluid or blood. ? Warmth. ? Pus or a bad smell. How to empty the bag Supplies needed  Rubbing alcohol.  Gauze pad or cotton ball.  Tape or a leg strap. Emptying the bag Pour the pee out of your bag when it is ?- full, or at least 2-3 times a day. Do this for your overnight bag and your leg bag. 1. Wash your hands with soap and water. 2. Separate (detach) the bag from your leg. 3. Hold the bag over the toilet or a clean pail. Keep the bag lower than your hips and bladder. This is so the pee (urine) does not go back into the tube. 4. Open the pour spout. It is at the bottom of the bag. 5. Empty the  pee into the toilet or pail. Do not let the pour spout touch any surface. 6. Put rubbing alcohol on a gauze pad or cotton ball. 7. Use the gauze pad or cotton ball to clean the pour spout. 8. Close the pour spout. 9. Attach the bag to your leg with tape or a leg strap. 10. Wash your hands with soap and water. Follow instructions for cleaning the drainage bag:  From the product maker.  As told by your doctor. How to change the bag Supplies needed  Alcohol wipes.  A clean bag.  Tape or a leg strap. Changing the bag Replace your bag when it starts to leak, smell bad, or look dirty. 1. Wash your hands with soap and water. 2. Separate the dirty bag from your leg. 3. Pinch the catheter with your fingers so that pee does not spill out. 4. Separate the catheter tube from the bag tube where these tubes connect (at the connection valve). Do not let the tubes touch any surface. 5. Clean the end of the catheter tube with an alcohol wipe. Use a different alcohol wipe to clean the end of the bag tube. 6. Connect the catheter tube to the tube of the clean bag. 7. Attach the clean bag to your leg with tape or a leg strap. Do not make the bag tight on your leg. 8. Wash your hands with soap and water. General rules   Never pull on your catheter. Never try to take it out. Doing that can hurt you.  Always wash your hands before and after you touch your catheter or bag. Use a mild, fragrance-free soap. If you do not have soap and water, use hand sanitizer.  Always make sure there are no twists or bends (kinks) in the catheter tube.  Always make sure there are no leaks in the catheter or bag.  Drink enough fluid to keep your pee pale yellow.  Do not take baths, swim, or use a hot tub.  If you are female, wipe from front to back after you poop (have a bowel movement). Contact a doctor if:  Your pee is cloudy.  Your pee smells worse than usual.  Your catheter gets clogged.  Your catheter  leaks.  Your bladder feels full. Get help right away if:  You have redness, swelling, or pain where the catheter goes into your body.  You have fluid, blood, pus, or a bad smell coming from the area where the catheter goes into your body.  Your skin feels warm where   the catheter goes into your body.  You have a fever.  You have pain in your: ? Belly (abdomen). ? Legs. ? Lower back. ? Bladder.  You see blood in the catheter.  Your pee is pink or red.  You feel sick to your stomach (nauseous).  You throw up (vomit).  You have chills.  Your pee is not draining into the bag.  Your catheter gets pulled out. Summary  An indwelling urinary catheter is a thin tube that is placed into the bladder to help drain pee (urine) out of the body.  The catheter is placed into the part of the body that drains pee from the bladder (urethra).  Taking good care of your catheter will keep it working properly and help prevent problems.  Always wash your hands before and after touching your catheter or bag.  Never pull on your catheter or try to take it out. This information is not intended to replace advice given to you by your health care provider. Make sure you discuss any questions you have with your health care provider. Document Revised: 07/18/2018 Document Reviewed: 11/09/2016 Elsevier Patient Education  2020 Elsevier Inc.  

## 2019-06-15 NOTE — Progress Notes (Signed)
[facility]  [phone] [phone]  Patient Care Team: Rubye Beach as PCP - General (Family Medicine) Deboraha Sprang, MD as PCP - Cardiology (Cardiology) Telford Nab, RN as Registered Nurse   Name of the patient: Megan Shelton  [id number]  1940/10/26   Date of visit: 06/15/19  Diagnosis-metastatic lung cancer  Chief complaint/ Reason for visit-altered mental status  Heme/Onc history:  Oncology History  Stage 4 malignant neoplasm of lung ([place])  05/11/2019 Initial Diagnosis   Stage 4 malignant neoplasm of lung ([place])   06/04/2019 -  Chemotherapy   The patient had palonosetron (ALOXI) injection 0.25 mg, 0.25 mg, Intravenous,  Once, 1 of 4 cycles Administration: 0.25 mg (06/04/2019) PEMEtrexed (ALIMTA) 900 mg in sodium chloride 0.9 % 100 mL chemo infusion, 500 mg/m2 = 900 mg, Intravenous,  Once, 1 of 6 cycles Administration: 900 mg (06/04/2019) CARBOplatin (PARAPLATIN) 320 mg in sodium chloride 0.9 % 250 mL chemo infusion, 320 mg (100 % of original dose 318.8 mg), Intravenous,  Once, 1 of 4 cycles Dose modification:   (original dose 318.8 mg, Cycle 1),   (original dose 318.8 mg, Cycle 3) Administration: 320 mg (06/04/2019) fosaprepitant (EMEND) 150 mg in sodium chloride 0.9 % 145 mL IVPB, 150 mg, Intravenous,  Once, 1 of 4 cycles Administration: 150 mg (06/04/2019) pembrolizumab (KEYTRUDA) 200 mg in sodium chloride 0.9 % 50 mL chemo infusion, 200 mg, Intravenous, Once, 1 of 6 cycles Administration: 200 mg (06/04/2019)  for chemotherapy treatment.      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  Per family who  accompanies her today and provides history, few days ago patient became more unsteady in her walking and ambulation, not eating and drinking well.  Mental status appears altered as she is normally quite talkative and began giving generic, one-word/short answers.  Family reported fever 100.6 last night which responded to Tylenol.  No fever this morning. No known precipitating factors. Symptoms have persisted since onset.   She denies headache, abdominal, back, pelvic pain, neck pain.  No urinary complaints but family says she has not voided since last night.  Unclear of last bowel movement.  Family reports malaise and change in ADLs. No recent travel, insect bites or animal contacts, toxic substance exposure.  Denies recent infections or treatment. Per family, at baseline patient is alert, responsive, and able to communicate her needs.   Patient was last seen by Dr. Janese Banks for post cycle 1 evaluation on 06/11/2019.  White count was decreased but ANC was normal.  Platelets decreased.  Hemoglobin normal.  Plan is to start [place].  No nausea, vomiting, or diarrhea. Reports appetite is reduced but weight is stable. No dizziness or falls. Eliquis currently held due to hemoptysis.   Review of systems- Review of Systems  Constitutional: Positive for fever (Tmax 100.6) and malaise/fatigue. Negative for chills and weight loss.  HENT: Negative for hearing loss, nosebleeds, sore throat and tinnitus.   Eyes: Negative for blurred vision and double vision.  Respiratory: Negative for cough, hemoptysis, shortness of breath and wheezing.   Cardiovascular: Negative for chest pain, palpitations and leg swelling.  Gastrointestinal: Negative for abdominal pain, blood in stool, constipation, diarrhea, melena, nausea and vomiting.  Genitourinary: Negative for dysuria, flank pain, frequency and  urgency.  Musculoskeletal: Negative for back pain, falls, joint pain and myalgias.  Skin: Negative for itching and rash.   Neurological: Positive for weakness. Negative for dizziness, tingling, sensory change, focal weakness, loss of consciousness and headaches.  Endo/Heme/Allergies: Negative for environmental allergies. Does not bruise/bleed easily.  Psychiatric/Behavioral: Positive for memory loss (per hpi). Negative for depression. The patient is not nervous/anxious and does not have insomnia.     Allergies  Allergen Reactions  . Latex Rash  . Nickel Rash    Past Medical History:  Diagnosis Date  . AICD (automatic cardioverter/defibrillator) present   . Anxiety   . CHF (congestive heart failure) ([place])   . Diabetes mellitus with neuropathy ([place])   . Dyspnea   . Hypertension   . Hypertensive heart disease   . Implantable cardioverter-defibrillator (ICD)-[employer]   . Myocardial infarction, anterior wall ([place])   . Obesity, Class II, BMI 35-39.9     Past Surgical History:  Procedure Laterality Date  . CARDIOVERSION N/A 05/12/2019   Procedure: CARDIOVERSION;  Surgeon: Nelva Bush, MD;  Location: [facility] ORS;  Service: Cardiovascular;  Laterality: N/A;  . CARPAL TUNNEL RELEASE    . cataract surgery    . EYE SURGERY    . FRACTURE SURGERY Left    ARM  . JOINT REPLACEMENT Left    tkr  . PORTA CATH INSERTION N/A 05/14/2019   Procedure: PORTA CATH INSERTION;  Surgeon: Algernon Huxley, MD;  Location: [facility];  Service: Cardiovascular;  Laterality: N/A;  . VIDEO BRONCHOSCOPY WITH ENDOBRONCHIAL ULTRASOUND N/A 05/11/2019   Procedure: VIDEO BRONCHOSCOPY WITH ENDOBRONCHIAL ULTRASOUND;  Surgeon: Tyler Pita, MD;  Location: [facility] ORS;  Service: Pulmonary;  Laterality: N/A;    Social History   Socioeconomic History  . Marital status: Widowed    Spouse name: Not on file  . Number of children: 3  . Years of education: Not on file  . Highest education level: High school graduate  Occupational History  . Occupation: retired  Tobacco Use  . Smoking status: Former Smoker    Packs/day: 1.00     Years: 55.00    Pack years: 55.00    Types: Cigarettes    Quit date: 2006    Years since quitting: 15.1  . Smokeless tobacco: Never Used  . Tobacco comment: quit about 18 years ago; as of 2019  Substance and Sexual Activity  . Alcohol use: No  . Drug use: No  . Sexual activity: Not on file  Other Topics Concern  . Not on file  Social History Narrative  . Not on file   Social Determinants of Health   Financial Resource Strain:   . Difficulty of Paying Living Expenses: Not on file  Food Insecurity:   . Worried About Charity fundraiser in the Last Year: Not on file  . Ran Out of Food in the Last Year: Not on file  Transportation Needs:   . Lack of Transportation (Medical): Not on file  . Lack of Transportation (Non-Medical): Not on file  Physical Activity: Inactive  . Days of Exercise per Week: 0 days  . Minutes of Exercise per Session: 0 min  Stress: No Stress Concern Present  . Feeling of Stress : Not at all  Social Connections: Unknown  . Frequency of Communication with Friends and Family: Patient refused  . Frequency of Social Gatherings with Friends and Family: Patient refused  . Attends Religious Services: Patient refused  . Active Member of Clubs or Organizations:  Patient refused  . Attends Archivist Meetings: Patient refused  . Marital Status: Patient refused  Intimate Partner Violence: Unknown  . Fear of Current or Ex-Partner: Patient refused  . Emotionally Abused: Patient refused  . Physically Abused: Patient refused  . Sexually Abused: Patient refused    Family History  Problem Relation Age of Onset  . Scleroderma Mother   . Raynaud syndrome Mother   . Cancer Father 29       colon cancer  . Heart disease Brother        died of heart attack  . Diabetes Brother      Current Outpatient Medications:  .  albuterol (VENTOLIN HFA) 108 (90 Base) MCG/ACT inhaler, Inhale 1 puff into the lungs every 6 (six) hours as needed for wheezing or shortness  of breath. (Patient not taking: Reported on 06/04/2019), Disp: 18 g, Rfl: 8 .  apixaban (ELIQUIS) 5 MG TABS tablet, Take 1 tablet (5 mg total) by mouth 2 (two) times daily., Disp: 180 tablet, Rfl: 1 .  atorvastatin (LIPITOR) 40 MG tablet, TAKE 1 TABLET BY MOUTH  DAILY (Patient taking differently: Take 40 mg by mouth daily. ), Disp: 90 tablet, Rfl: 3 .  Blood Glucose Monitoring Suppl (ACCU-CHEK COMPACT CARE KIT) KIT, To check blood sugar daily., Disp: 1 kit, Rfl: 0 .  Blood Glucose Monitoring Suppl (ACCU-CHEK GUIDE) w/Device KIT, , Disp: , Rfl:  .  clonazePAM (KLONOPIN) 1 MG tablet, TAKE 1 TABLET BY MOUTH THREE TIMES A DAY AS NEEDED FOR ANXIETY, Disp: 90 tablet, Rfl: 5 .  dexamethasone (DECADRON) 4 MG tablet, Take 1 tab two times a day the day before Alimta chemo, then take 2 tabs once a day for 3 days starting the day after chemo., Disp: 30 tablet, Rfl: 1 .  folic acid (FOLVITE) 1 MG tablet, Take 1 tablet (1 mg total) by mouth daily. Start 5-7 days before Alimta chemotherapy. Continue until 21 days after Alimta completed., Disp: 100 tablet, Rfl: 3 .  furosemide (LASIX) 40 MG tablet, Take 1 tablet (40 mg total) by mouth daily as needed for fluid. (Patient not taking: Reported on 06/04/2019), Disp: 90 tablet, Rfl: 1 .  glimepiride (AMARYL) 2 MG tablet, Take 1 tablet (2 mg total) by mouth 2 (two) times daily., Disp: 180 tablet, Rfl: 1 .  glucose blood (ACCU-CHEK COMPACT PLUS) test strip, To check blood sugar daily., Disp: 100 each, Rfl: 12 .  hydrocortisone 1 % ointment, Apply 1 application topically 2 (two) times daily. May use for skin irritation on bilateral eyelids. Do not get inside of eyes. (Patient taking differently: Apply 1 application topically 2 (two) times daily as needed for itching. May use for skin irritation on bilateral eyelids. Do not get inside of eyes.), Disp: 30 g, Rfl: 0 .  Lancets (ACCU-CHEK SOFT TOUCH) lancets, To check blood sugar daily., Disp: 100 each, Rfl: 12 .  levothyroxine  (SYNTHROID) 100 MCG tablet, Take 1 tablet (100 mcg total) by mouth daily at 6 (six) AM., Disp: 90 tablet, Rfl: 1 .  lidocaine-prilocaine (EMLA) cream, Apply to affected area once, Disp: 30 g, Rfl: 3 .  lisinopril (ZESTRIL) 10 MG tablet, TAKE 1 TABLET BY MOUTH  DAILY (Patient taking differently: Take 10 mg by mouth daily. ), Disp: 90 tablet, Rfl: 3 .  LORazepam (ATIVAN) 0.5 MG tablet, Take 1 tablet (0.5 mg total) by mouth every 6 (six) hours as needed (Nausea or vomiting)., Disp: 30 tablet, Rfl: 0 .  magic mouthwash w/lidocaine SOLN,  Take 5 mLs by mouth 4 (four) times daily., Disp: 240 mL, Rfl: 0 .  metFORMIN (GLUCOPHAGE-XR) 500 MG 24 hr tablet, Take 1 tablet (500 mg total) by mouth 2 (two) times daily., Disp: 180 tablet, Rfl: 1 .  metoprolol tartrate (LOPRESSOR) 50 MG tablet, Take 1 tablet (50 mg total) by mouth 2 (two) times daily., Disp: 180 tablet, Rfl: 1 .  ondansetron (ZOFRAN ODT) 4 MG disintegrating tablet, Take 1 tablet (4 mg total) by mouth every 8 (eight) hours as needed for nausea or vomiting. (Patient not taking: Reported on 06/04/2019), Disp: 60 tablet, Rfl: 1 .  ondansetron (ZOFRAN) 8 MG tablet, Take 1 tablet (8 mg total) by mouth 2 (two) times daily as needed (Nausea or vomiting). Start if needed on the third day after chemotherapy., Disp: 30 tablet, Rfl: 1 .  ONETOUCH DELICA LANCETS 16X MISC, CHECK BLOOD SUGAR ONCE  DAILY, Disp: 100 each, Rfl: 11 .  osimertinib mesylate (TAGRISSO) 80 MG tablet, Take 1 tablet (80 mg total) by mouth daily., Disp: 30 tablet, Rfl: 10 .  oxyCODONE (OXY IR/ROXICODONE) 5 MG immediate release tablet, Take 1 tablet (5 mg total) by mouth every 4 (four) hours as needed for severe pain., Disp: 60 tablet, Rfl: 0 .  prochlorperazine (COMPAZINE) 10 MG tablet, Take 1 tablet (10 mg total) by mouth every 6 (six) hours as needed (Nausea or vomiting)., Disp: 30 tablet, Rfl: 1 .  pyridOXINE (VITAMIN B-6) 100 MG tablet, Take 100 mg by mouth daily., Disp: , Rfl:  .  sitaGLIPtin  (JANUVIA) 100 MG tablet, Take 100 mg by mouth daily., Disp: , Rfl:  .  vitamin B-12 (CYANOCOBALAMIN) 100 MCG tablet, Take 100 mcg by mouth daily., Disp: , Rfl:   Physical exam:  Vitals:   06/15/19 1117  BP: 119/60  Pulse: 75  Resp: 18  Temp: 97.6 F (36.4 C)  TempSrc: Tympanic  SpO2: 95%   Physical Exam Constitutional:      Appearance: She is well-developed.     Comments: Elderly female seen in recliner in exam room. Wearing mask. Accompanied by daughter in law.   HENT:     Head: Normocephalic and atraumatic.     Nose: Nose normal.     Mouth/Throat:     Pharynx: No oropharyngeal exudate.     Comments: Mouth sores Eyes:     General: No scleral icterus.    Extraocular Movements: Extraocular movements intact.     Conjunctiva/sclera: Conjunctivae normal.     Pupils: Pupils are equal, round, and reactive to light.  Cardiovascular:     Rate and Rhythm: Normal rate and regular rhythm.  Pulmonary:     Effort: Pulmonary effort is normal.     Breath sounds: Normal breath sounds.  Abdominal:     General: Bowel sounds are normal. There is no distension.     Palpations: Abdomen is soft.  Musculoskeletal:        General: Normal range of motion.     Cervical back: Normal range of motion and neck supple.  Skin:    General: Skin is warm and dry.  Neurological:     Mental Status: She is alert. She is confused.     GCS: GCS eye subscore is 4. GCS verbal subscore is 4. GCS motor subscore is 6.     Motor: No weakness.  Psychiatric:        Attention and Perception: Attention normal.        Mood and Affect: Mood and affect normal.  Speech: Speech normal.     CMP Latest Ref Rng & Units 06/15/2019  Glucose 70 - 99 mg/dL 143(H)  BUN 8 - 23 mg/dL 10  Creatinine 0.44 - 1.00 mg/dL <0.30(L)  Sodium 135 - 145 mmol/L 131(L)  Potassium 3.5 - 5.1 mmol/L 3.2(L)  Chloride 98 - 111 mmol/L 97(L)  CO2 22 - 32 mmol/L 24  Calcium 8.9 - 10.3 mg/dL 7.0(L)  Total Protein 6.5 - 8.1 g/dL 5.8(L)   Total Bilirubin 0.3 - 1.2 mg/dL 0.8  Alkaline Phos 38 - 126 U/L 171(H)  AST 15 - 41 U/L 30  ALT 0 - 44 U/L 32   CBC Latest Ref Rng & Units 06/15/2019  WBC 4.0 - 10.5 K/uL 4.0  Hemoglobin 12.0 - 15.0 g/dL 13.1  Hematocrit 36.0 - 46.0 % 40.1  Platelets 150 - 400 K/uL 53(L)    No images are attached to the encounter.  No results found.  Assessment and plan- Patient is a 79 y.o. female diagnosed with stage IV lung cancer with brain and bone metastases, s/p radiation for pathologic fx to left humerus and whole brain radiation, currently s/p cycle 1 of carboplatin-Alimta-Keytruda chemotherapy with plan to start Tagrisso, who presents to symptom management clinic for fever, altered mental status, and generalized weakness.   Labs overall reassuring. Sodium slightly low at 131, potassium 3.2, corrected calcium 8.12. Plts decreased but improved at 53. No leukocytosis or neutropenia. Bladder scan revealed urinary retention. Foley placed with 850 cc urine return. AMS did not improve with bladder decompression. UA negative for infection. Culture pending. Blood cultures pending but no tachycardia or hypotension. Received 500 cc IV fluids in setting of hx of heart failure, along with calcium gluconate 1 g for hypocalcemia. Ok to restart eliquis with fall precautions (previously held d/t hemoptysis).   Etiology of AMS unclear. Given history of brain metastases, recommend transfer to ER for further evaluation and imaging. Discussed with Dr. Janese Banks, medical-oncology, who independently assessed patient and agrees with plan of care.   Disposition:  Refer to ER for further evaluation & management  Visit Diagnosis No diagnosis found.  Patient expressed understanding and was in agreement with this plan. She also understands that She can call clinic at any time with any questions, concerns, or complaints.   Thank you for allowing me to participate in the care of this very pleasant patient.   Beckey Rutter, DNP,  AGNP-[facility]  CC:

## 2019-06-15 NOTE — Telephone Encounter (Signed)
Son Gerald Stabs called concerned about changes in patient. She is confused, has an unsteady gait, hands shaking , not eating or drinking much Has fever 100.6, told by On call last night to give her Tylenol and it came down, but went back up again this morning. Please advise

## 2019-06-15 NOTE — ED Provider Notes (Signed)
[facility] Emergency Department Provider Note  ____________________________________________   First MD Initiated Contact with Patient 06/15/19 1735     (approximate)  I have reviewed the triage vital signs and the nursing notes.   HISTORY  Chief Complaint Weakness and Altered Mental Status    HPI Megan Shelton is a 79 y.o. 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  Patient was reported to have a fever yesterday that got better with Tylenol and some weakness and confusion over the past few days.  Patient was sent over to the ER for brain imaging.  The confusion has been going on for the past few days, severe, constant, nothing makes it better, nothing makes it worse  Patient already had a urine that was negative this morning.  Patient did have a bladder scan that showed urinary retention and so Foley was placed. CBC was reassuring. CMP showed sodium of 131, potassium of 3.2, chloride of 97, alk phos of 171 Blood cultures are pending          Past Medical History:  Diagnosis Date  . AICD (automatic cardioverter/defibrillator) present   . Anxiety   . CHF (congestive heart failure) ([place])   . Diabetes mellitus with neuropathy ([place])   . Dyspnea   . Hypertension   . Hypertensive heart disease   . Implantable cardioverter-defibrillator (ICD)-[employer]   . Myocardial infarction, anterior wall ([place])   . Obesity, Class II, BMI 35-39.9     Patient Active Problem List   Diagnosis Date Noted  . Urinary retention 06/15/2019  . Altered mental status 06/15/2019  . Brain metastases ([place])   . Wheeze   . Stage 4 malignant neoplasm of lung ([place]) 05/11/2019  . Atrial fibrillation with RVR ([place]) 05/11/2019  . Goals of care, counseling/discussion 04/30/2019  . Bone metastases ([place]) 04/30/2019  . Cardiac defibrillator in  place 04/20/2019  . Eyelid dermatitis, allergic/contact- bilateral  02/25/2019  . Closed displaced transverse fracture of shaft of left humerus 02/20/2019  . Coronary artery disease involving native coronary artery of native heart without angina pectoris 01/15/2019  . Chronic HFrEF (heart failure with reduced ejection fraction) ([place]) 01/15/2019  . Ischemic cardiomyopathy 01/15/2019  . Hyperlipidemia LDL goal <70 01/15/2019  . GAD (generalized anxiety disorder) 05/16/2018  . Allergic rhinitis 05/03/2017  . Arthritis 05/03/2017  . Cataract 05/03/2017  . Congestive heart failure ([place]) 05/03/2017  . Diabetes mellitus without complication ([place]) 81/85/6314  . Essential hypertension 05/03/2017  . History of heart attack 05/03/2017  . H/O thyroidectomy 05/03/2017  . H/O total knee replacement 05/03/2017    Past Surgical History:  Procedure Laterality Date  . CARDIOVERSION N/A 05/12/2019   Procedure: CARDIOVERSION;  Surgeon: Nelva Bush, MD;  Location: [facility] ORS;  Service: Cardiovascular;  Laterality: N/A;  . CARPAL TUNNEL RELEASE    . cataract surgery    . EYE SURGERY    . FRACTURE SURGERY Left    ARM  . JOINT REPLACEMENT Left    tkr  . PORTA CATH INSERTION N/A 05/14/2019   Procedure: PORTA CATH INSERTION;  Surgeon: Algernon Huxley, MD;  Location: [place] CV LAB;  Service: Cardiovascular;  Laterality: N/A;  . VIDEO BRONCHOSCOPY WITH ENDOBRONCHIAL ULTRASOUND N/A 05/11/2019   Procedure: VIDEO BRONCHOSCOPY WITH ENDOBRONCHIAL ULTRASOUND;  Surgeon: Tyler Pita, MD;  Location: [facility] ORS;  Service: Pulmonary;  Laterality: N/A;  Prior to Admission medications   Medication Sig Start Date End Date Taking? Authorizing Provider  albuterol (VENTOLIN HFA) 108 (90 Base) MCG/ACT inhaler Inhale 1 puff into the lungs every 6 (six) hours as needed for wheezing or shortness of breath. Patient not taking: Reported on 06/04/2019 05/14/19   Loletha Grayer, MD  apixaban (ELIQUIS) 5 MG TABS tablet  Take 1 tablet (5 mg total) by mouth 2 (two) times daily. Patient not taking: Reported on 06/15/2019 05/18/19   Mar Daring, PA-C  atorvastatin (LIPITOR) 40 MG tablet TAKE 1 TABLET BY MOUTH  DAILY Patient taking differently: Take 40 mg by mouth daily.  01/12/19   Mar Daring, PA-C  Blood Glucose Monitoring Suppl (ACCU-CHEK COMPACT CARE KIT) KIT To check blood sugar daily. 11/24/18   Mar Daring, PA-C  Blood Glucose Monitoring Suppl (ACCU-CHEK GUIDE) w/Device KIT  11/24/18   [provider]  clonazePAM (KLONOPIN) 1 MG tablet TAKE 1 TABLET BY MOUTH THREE TIMES A DAY AS NEEDED FOR ANXIETY Patient not taking: Reported on 06/15/2019 05/25/19   Mar Daring, PA-C  dexamethasone (DECADRON) 4 MG tablet Take 1 tab two times a day the day before Alimta chemo, then take 2 tabs once a day for 3 days starting the day after chemo. Patient not taking: Reported on 06/15/2019 05/21/19   Sindy Guadeloupe, MD  folic acid (FOLVITE) 1 MG tablet Take 1 tablet (1 mg total) by mouth daily. Start 5-7 days before Alimta chemotherapy. Continue until 21 days after Alimta completed. 05/21/19   Sindy Guadeloupe, MD  furosemide (LASIX) 40 MG tablet Take 1 tablet (40 mg total) by mouth daily as needed for fluid. Patient not taking: Reported on 06/04/2019 05/18/19   Mar Daring, PA-C  glimepiride (AMARYL) 2 MG tablet Take 1 tablet (2 mg total) by mouth 2 (two) times daily. 05/18/19   Mar Daring, PA-C  glucose blood (ACCU-CHEK COMPACT PLUS) test strip To check blood sugar daily. 11/24/18   Mar Daring, PA-C  hydrocortisone 1 % ointment Apply 1 application topically 2 (two) times daily. May use for skin irritation on bilateral eyelids. Do not get inside of eyes. Patient taking differently: Apply 1 application topically 2 (two) times daily as needed for itching. May use for skin irritation on bilateral eyelids. Do not get inside of eyes. 03/04/19   Mar Daring, PA-C  Lancets  (ACCU-CHEK SOFT TOUCH) lancets To check blood sugar daily. 11/24/18   Mar Daring, PA-C  levothyroxine (SYNTHROID) 100 MCG tablet Take 1 tablet (100 mcg total) by mouth daily at 6 (six) AM. 05/18/19   Burnette, Clearnce Sorrel, PA-C  lidocaine-prilocaine (EMLA) cream Apply to affected area once 05/21/19   Sindy Guadeloupe, MD  lisinopril (ZESTRIL) 10 MG tablet TAKE 1 TABLET BY MOUTH  DAILY Patient taking differently: Take 10 mg by mouth daily.  01/12/19   Mar Daring, PA-C  LORazepam (ATIVAN) 0.5 MG tablet Take 1 tablet (0.5 mg total) by mouth every 6 (six) hours as needed (Nausea or vomiting). 05/21/19   Sindy Guadeloupe, MD  magic mouthwash w/lidocaine SOLN Take 5 mLs by mouth 4 (four) times daily. 06/09/19   Jacquelin Hawking, NP  metFORMIN (GLUCOPHAGE-XR) 500 MG 24 hr tablet Take 1 tablet (500 mg total) by mouth 2 (two) times daily. 05/18/19   Mar Daring, PA-C  metoprolol tartrate (LOPRESSOR) 50 MG tablet Take 1 tablet (50 mg total) by mouth 2 (two) times daily. 05/18/19  Fenton Malling M, PA-C  ondansetron (ZOFRAN ODT) 4 MG disintegrating tablet Take 1 tablet (4 mg total) by mouth every 8 (eight) hours as needed for nausea or vomiting. Patient not taking: Reported on 06/15/2019 05/08/19   Sindy Guadeloupe, MD  ondansetron (ZOFRAN) 8 MG tablet Take 1 tablet (8 mg total) by mouth 2 (two) times daily as needed (Nausea or vomiting). Start if needed on the third day after chemotherapy. 05/21/19   Sindy Guadeloupe, MD  [facility] DELICA LANCETS 94W MISC CHECK BLOOD SUGAR ONCE  DAILY 10/11/17   Mar Daring, PA-C  osimertinib mesylate (TAGRISSO) 80 MG tablet Take 1 tablet (80 mg total) by mouth daily. Patient not taking: Reported on 06/15/2019 06/11/19   Sindy Guadeloupe, MD  oxyCODONE (OXY IR/ROXICODONE) 5 MG immediate release tablet Take 1 tablet (5 mg total) by mouth every 4 (four) hours as needed for severe pain. 05/08/19   Sindy Guadeloupe, MD  prochlorperazine (COMPAZINE) 10 MG tablet Take 1  tablet (10 mg total) by mouth every 6 (six) hours as needed (Nausea or vomiting). 05/21/19   Sindy Guadeloupe, MD  pyridOXINE (VITAMIN B-6) 100 MG tablet Take 100 mg by mouth daily.    [provider]  sitaGLIPtin (JANUVIA) 100 MG tablet Take 100 mg by mouth daily.    [provider]  vitamin B-12 (CYANOCOBALAMIN) 100 MCG tablet Take 100 mcg by mouth daily.    [provider]    Allergies Latex and Nickel  Family History  Problem Relation Age of Onset  . Scleroderma Mother   . Raynaud syndrome Mother   . Cancer Father 5       colon cancer  . Heart disease Brother        died of heart attack  . Diabetes Brother     Social History Social History   Tobacco Use  . Smoking status: Former Smoker    Packs/day: 1.00    Years: 55.00    Pack years: 55.00    Types: Cigarettes    Quit date: 2006    Years since quitting: 15.1  . Smokeless tobacco: Never Used  . Tobacco comment: quit about 18 years ago; as of 2019  Substance Use Topics  . Alcohol use: No  . Drug use: No      Review of Systems Constitutional: Positive fever Eyes: No visual changes. ENT: No sore throat. Cardiovascular: Denies chest pain. Respiratory: Denies shortness of breath. Gastrointestinal: No abdominal pain.  No nausea, no vomiting.  No diarrhea.  No constipation. Genitourinary: Negative for dysuria. Musculoskeletal: Negative for back pain. Skin: Negative for rash. Neurological: Negative for headaches, focal weakness or numbness.  Positive confusion All other ROS negative ____________________________________________   PHYSICAL EXAM:  VITAL SIGNS: ED Triage Vitals [06/15/19 1620]  Enc Vitals Group     BP (!) 132/52     Pulse Rate 85     Resp 16     Temp 98.7 F (37.1 C)     Temp Source Oral     SpO2 95 %     Weight 150 lb (68 kg)     Height '5\' 5"'  (1.651 m)     Head Circumference      Peak Flow      Pain Score 0     Pain Loc      Pain Edu?      Excl. in [place]?      Constitutional: Alert and oriented x1 Well appearing and in no  acute distress. Eyes: Conjunctivae are normal. EOMI. Head: Atraumatic. Nose: No congestion/rhinnorhea. Mouth/Throat: Mucous membranes are moist.   Neck: No stridor. Trachea Midline. FROM Cardiovascular: Normal rate, regular rhythm. Grossly normal heart sounds.  Good peripheral circulation. Respiratory: Normal respiratory effort.  No retractions. Lungs CTAB. Gastrointestinal: Soft and nontender. No distention. No abdominal bruits.  Musculoskeletal: No lower extremity tenderness nor edema.  No joint effusions. Neurologic: No obvious deficits noted.  Equal strength in arms peer able to lift up both legs up off the bed with equal strength.  However patient is significantly confused and does have some stuttering when trying to answer questions.  She is not even able to identify her son in the room which is an acute change from normal Skin:  Skin is warm, dry and intact. No rash noted. Psychiatric: Mood and affect are normal. Speech and behavior are normal. GU: Deferred   ____________________________________________   LABS (all labs ordered are listed, but only abnormal results are displayed)  Labs Reviewed  GLUCOSE, CAPILLARY - Abnormal; Notable for the following components:      Result Value   Glucose-Capillary 51 (*)    All other components within normal limits  RESPIRATORY PANEL BY RT PCR (FLU A&B, COVID)  CBG MONITORING, ED   ____________________________________________   ED ECG REPORT I, Vanessa Gordon, the attending physician, personally viewed and interpreted this ECG.  EKG is normal sinus rate of 89, no ST elevation, wandering baseline so somewhat difficult to interpret normal intervals ____________________________________________  RADIOLOGY   Official radiology report(s): CT HEAD WO CONTRAST  Result Date: 06/15/2019 CLINICAL DATA:  Altered mental status. EXAM: CT HEAD WITHOUT CONTRAST TECHNIQUE:  Contiguous axial images were obtained from the base of the skull through the vertex without intravenous contrast. COMPARISON:  May 08, 2019. FINDINGS: Brain: Increased left frontal lobe low density with sulcal effacement is noted concerning for infarction or edema secondary to underlying metastatic lesion. 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 Ventricular size is within normal limits. No definite evidence of hemorrhage is noted. No midline shift is noted. Vascular: No hyperdense vessel or unexpected calcification. Skull: Normal. Negative for fracture or focal lesion. Sinuses/Orbits: Bilateral ethmoid and sphenoid sinusitis. Other: None. IMPRESSION: Increased left frontal lobe low density with sulcal effacement is noted concerning for infarction or edema secondary to underlying metastatic lesion. MRI with and without gadolinium is recommended for further evaluation. Electronically Signed   By: Marijo Conception M.D.   On: 06/15/2019 16:57   DG Chest Portable 1 View  Result Date: 06/15/2019 CLINICAL DATA:  Fever, metastatic lung cancer EXAM: PORTABLE CHEST 1 VIEW COMPARISON:  None. FINDINGS: Left perihilar density likely reflecting partially visualized mass. There is mild elevation of the left hemidiaphragm. Mild nonspecific interstitial prominence. Heart size is likely within normal limits for portable technique. Left chest wall ICD is present. Right chest wall port catheter tip overlies cavoatrial junction. IMPRESSION: Left perihilar density likely reflecting partially visualized mass. Mild elevation of the left hemidiaphragm. Mild bibasilar atelectasis and nonspecific interstitial prominence. Electronically Signed   By: Macy Mis M.D.   On: 06/15/2019 18:42    ____________________________________________   PROCEDURES  Procedure(s) performed (including Critical Care):  Procedures   ____________________________________________   INITIAL IMPRESSION / ASSESSMENT AND PLAN / ED  COURSE  Megan Shelton was evaluated in Emergency Department on 06/15/2019 for the symptoms described in the history of present illness. She was evaluated in the context of the global COVID-19 pandemic, which necessitated consideration that the patient  might be at risk for infection with the SARS-CoV-2 virus that causes COVID-19. Institutional protocols and algorithms that pertain to the evaluation of patients at risk for COVID-19 are in a state of rapid change based on information released by regulatory bodies including the CDC and federal and state organizations. These policies and algorithms were followed during the patient's care in the ED.    Patient comes in with concern for possible fever but has been afebrile here.  Patient already has labs and blood cultures cooking.  Patient was sent here for imaging to evaluate for stroke, metastatic disease with worsening edema.  No evidence of UTI or significant electrolyte abnormalities.  Patient's sugar was slightly low given some dextrose and will get repeat sugar.  CT head was concerning for either stroke versus met.  Patient unable to get MRI due to her ICD.  Will get contrasted CT.  Discussed with Dr. Rogue Bussing who recommended Decadron 10 mg and then 6 mg every 6 hours and admission to the hospital team.  If patient is found to have a MRI compatible ICD consider patient need to be transferred to [facility] if status not improving.         ____________________________________________   FINAL CLINICAL IMPRESSION(S) / ED DIAGNOSES   Final diagnoses:  Stage 4 malignant neoplasm of lung, unspecified laterality (HCC)  Altered mental status, unspecified altered mental status type      MEDICATIONS GIVEN DURING THIS VISIT:  Medications  dexamethasone (DECADRON) injection 10 mg (has no administration in time range)  sodium chloride 0.9 % bolus 500 mL (500 mLs Intravenous New Bag/Given 06/15/19 1818)  dextrose 50 % solution 50 mL (50 mLs Intravenous Given  06/15/19 1834)  iohexol (OMNIPAQUE) 300 MG/ML solution 75 mL (75 mLs Intravenous Contrast Given 06/15/19 1956)     ED Discharge Orders    None       Note:  This document was prepared using Dragon voice recognition software and may include unintentional dictation errors.   Vanessa [place], MD 06/15/19 2030

## 2019-06-15 NOTE — ED Triage Notes (Signed)
Patient from home with family. Per family, patient has been having worsening weakness and confusion for the last few days. Patient able to give name and birthday but unaware of where she is and why she's here. Arrives with foley catheter in place.

## 2019-06-15 NOTE — ED Notes (Signed)
Pt transported to CT ?

## 2019-06-15 NOTE — H&P (Signed)
History and Physical    Megan Shelton YSA:[id number] DOB: Aug 07, 1940 DOA: 06/15/2019  PCP: Mar Daring, PA-C   Patient coming from: home I have personally briefly reviewed patient's old medical records in [place]  Chief Complaint: altered mental status  HPI: Megan Shelton is a 79 y.o. 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 fib with RVR from 05/11/2019 to 05/14/2019, treated with cardioversion, recently seen by oncologist on 07/01/2019 with discontinuation of Eliquis secondary to thrombocytopenia below 50,000  related to chemotherapy as well as discontinuation of Decadron who presents to the emergency room with altered mental status.  Most of the history is taken from her son Megan Shelton over the phone, who states that at baseline patient is very talkative very interactive and independent, but to the day after discontinuation of Eliquis and Decadron was noted to be confused and having generalized weakness to where they had to help her off the stool, and unsteadiness on her feet to where she needed someone's assistance to ambulate.  She also had some nausea and decreased appetite but denied any pain.  She had no cough or shortness of breath.  Son also noted that she started having a low-grade fever in the past 2 days of about 100.6.  Patient was seen by her oncologist earlier in the day and had blood work done which showed an improved platelet count of 53,000.  He was sent to the ER for work-up of her altered mentation.  ED Course: On arrival in the emergency room, patient was afebrile with mild tachycardia of 105, BP 145/99 O2 sat 98% on room air.  She had some minor electrolyte abnormalities, mild hyponatremia 131, mild hypokalemia 3.2 blood  work was essentially unremarkable except for platelet count of 53,000, WBC 4, hemoglobin 13.1.  Her plain head CT showed possible infarction or edema secondary to metastatic lesions with recommendation for MRI with and without gadolinium.  She could not get MRI because of her AICD the ER provider initially spoke with covering oncologist Dr. Rogue Bussing, and we also had a three-way discussion on secure text with her primary oncologist, Dr. Janese Banks with decision to get CT head with and without contrast, start IV Decadron 10 mg IV x1 then continue every 6.  And to reevaluate in the a.m. Review of Systems: Unable to obtain due to altered mental status  Past Medical History:  Diagnosis Date  . AICD (automatic cardioverter/defibrillator) present   . Anxiety   . CHF (congestive heart failure) ([place])   . Diabetes mellitus with neuropathy ([place])   . Dyspnea   . Hypertension   . Hypertensive heart disease   . Implantable cardioverter-defibrillator (ICD)-[employer]   . Myocardial infarction, anterior wall ([place])   . Obesity, Class II, BMI 35-39.9     Past Surgical History:  Procedure Laterality Date  . CARDIOVERSION N/A 05/12/2019   Procedure: CARDIOVERSION;  Surgeon: Nelva Bush, MD;  Location: [facility] ORS;  Service: Cardiovascular;  Laterality: N/A;  . CARPAL TUNNEL RELEASE    . cataract surgery    . EYE SURGERY    . FRACTURE SURGERY Left    ARM  . JOINT REPLACEMENT Left    tkr  . PORTA CATH INSERTION N/A 05/14/2019   Procedure: PORTA CATH INSERTION;  Surgeon: Algernon Huxley, MD;  Location: [facility] INVASIVE CV  LAB;  Service: Cardiovascular;  Laterality: N/A;  . VIDEO BRONCHOSCOPY WITH ENDOBRONCHIAL ULTRASOUND N/A 05/11/2019   Procedure: VIDEO BRONCHOSCOPY WITH ENDOBRONCHIAL ULTRASOUND;  Surgeon: Tyler Pita, MD;  Location: [facility] ORS;  Service: Pulmonary;  Laterality: N/A;     reports that she quit smoking about 15 years ago. Her smoking use included cigarettes. She has a 55.00 pack-year smoking history.  She has never used smokeless tobacco. She reports that she does not drink alcohol or use drugs.  Allergies  Allergen Reactions  . Latex Rash  . Nickel Rash    Family History  Problem Relation Age of Onset  . Scleroderma Mother   . Raynaud syndrome Mother   . Cancer Father 81       colon cancer  . Heart disease Brother        died of heart attack  . Diabetes Brother      Prior to Admission medications   Medication Sig Start Date End Date Taking? Authorizing Provider  albuterol (VENTOLIN HFA) 108 (90 Base) MCG/ACT inhaler Inhale 1 puff into the lungs every 6 (six) hours as needed for wheezing or shortness of breath. 05/14/19  Yes Wieting, Richard, MD  atorvastatin (LIPITOR) 40 MG tablet TAKE 1 TABLET BY MOUTH  DAILY Patient taking differently: Take 40 mg by mouth daily.  01/12/19  Yes Burnette, Anderson Malta M, PA-C  clonazePAM (KLONOPIN) 1 MG tablet TAKE 1 TABLET BY MOUTH THREE TIMES A DAY AS NEEDED FOR ANXIETY 05/25/19  Yes Fenton Malling M, PA-C  folic acid (FOLVITE) 1 MG tablet Take 1 tablet (1 mg total) by mouth daily. Start 5-7 days before Alimta chemotherapy. Continue until 21 days after Alimta completed. 05/21/19  Yes Sindy Guadeloupe, MD  furosemide (LASIX) 40 MG tablet Take 1 tablet (40 mg total) by mouth daily as needed for fluid. 05/18/19  Yes Mar Daring, PA-C  glimepiride (AMARYL) 2 MG tablet Take 1 tablet (2 mg total) by mouth 2 (two) times daily. 05/18/19  Yes Mar Daring, PA-C  levothyroxine (SYNTHROID) 100 MCG tablet Take 1 tablet (100 mcg total) by mouth daily at 6 (six) AM. 05/18/19  Yes Burnette, Clearnce Sorrel, PA-C  lisinopril (ZESTRIL) 10 MG tablet TAKE 1 TABLET BY MOUTH  DAILY Patient taking differently: Take 10 mg by mouth daily.  01/12/19  Yes Mar Daring, PA-C  magic mouthwash w/lidocaine SOLN Take 5 mLs by mouth 4 (four) times daily. 06/09/19  Yes Jacquelin Hawking, NP  metFORMIN (GLUCOPHAGE-XR) 500 MG 24 hr tablet Take 1 tablet (500 mg total) by  mouth 2 (two) times daily. 05/18/19  Yes Mar Daring, PA-C  metoprolol tartrate (LOPRESSOR) 50 MG tablet Take 1 tablet (50 mg total) by mouth 2 (two) times daily. 05/18/19  Yes Mar Daring, PA-C  oxyCODONE (OXY IR/ROXICODONE) 5 MG immediate release tablet Take 1 tablet (5 mg total) by mouth every 4 (four) hours as needed for severe pain. 05/08/19  Yes Sindy Guadeloupe, MD  sitaGLIPtin (JANUVIA) 100 MG tablet Take 100 mg by mouth daily.   Yes [provider]  apixaban (ELIQUIS) 5 MG TABS tablet Take 1 tablet (5 mg total) by mouth 2 (two) times daily. 05/18/19   Mar Daring, PA-C    Physical Exam: Vitals:   06/15/19 1620 06/15/19 1800  BP: (!) 132/52 (!) 141/77  Pulse: 85 82  Resp: 16 (!) 22  Temp: 98.7 F (37.1 C)   TempSrc: Oral   SpO2: 95% 93%  Weight: 68  kg   Height: 5\' 5"  (1.651 m)      Vitals:   06/15/19 1620 06/15/19 1800  BP: (!) 132/52 (!) 141/77  Pulse: 85 82  Resp: 16 (!) 22  Temp: 98.7 F (37.1 C)   TempSrc: Oral   SpO2: 95% 93%  Weight: 68 kg   Height: 5\' 5"  (1.651 m)     Constitutional: Alert and awake, oriented x2, not in any acute distress. Eyes: PERLA, EOMI, irises appear normal, anicteric sclera,  ENMT: external ears and nose appear normal, normal hearing             Lips appears normal, oropharynx mucosa, tongue, posterior pharynx appear normal  Neck: neck appears normal, no masses, normal ROM, no thyromegaly, no JVD  CVS: S1-S2 clear, no murmur rubs or gallops,  , no carotid bruits, pedal pulses palpable, No LE edema Respiratory:diminished bilaterally, no wheezing, rales or rhonchi. Respiratory effort normal. No accessory muscle use.  Abdomen: soft nontender, nondistended, normal bowel sounds, no hepatosplenomegaly, no hernias Musculoskeletal: : no cyanosis, clubbing , no contractures or atrophy Neuro: Cranial nerves II-XII intact, sensation, reflexes normal, strength Psych: judgement and insight appear normal, stable mood and  affect,  Skin: no rashes or lesions or ulcers, no induration or nodules   Labs on Admission: I have personally reviewed following labs and imaging studies  CBC: Recent Labs  Lab 06/11/19 1253 06/15/19 1043  WBC 2.5* 4.0  NEUTROABS 2.1 3.5  HGB 14.4 13.1  HCT 43.8 40.1  MCV 83.4 82.7  PLT 32* 53*   Basic Metabolic Panel: Recent Labs  Lab 06/11/19 1253 06/15/19 1043  NA 135 131*  K 3.4* 3.2*  CL 100 97*  CO2 24 24  GLUCOSE 194* 143*  BUN 15 10  CREATININE 0.42* <0.30*  CALCIUM 7.2* 7.0*  MG  --  1.8   GFR: CrCl cannot be calculated (This lab value cannot be used to calculate CrCl because it is not a number: <0.30). Liver Function Tests: Recent Labs  Lab 06/15/19 1043  AST 30  ALT 32  ALKPHOS 171*  BILITOT 0.8  PROT 5.8*  ALBUMIN 2.6*   No results for input(s): LIPASE, AMYLASE in the last 168 hours. No results for input(s): AMMONIA in the last 168 hours. Coagulation Profile: No results for input(s): INR, PROTIME in the last 168 hours. Cardiac Enzymes: No results for input(s): CKTOTAL, CKMB, CKMBINDEX, TROPONINI in the last 168 hours. BNP (last 3 results) No results for input(s): PROBNP in the last 8760 hours. HbA1C: No results for input(s): HGBA1C in the last 72 hours. CBG: Recent Labs  Lab 06/15/19 1827 06/15/19 2032  GLUCAP 51* 108*   Lipid Profile: No results for input(s): CHOL, HDL, LDLCALC, TRIG, CHOLHDL, LDLDIRECT in the last 72 hours. Thyroid Function Tests: No results for input(s): TSH, T4TOTAL, FREET4, T3FREE, THYROIDAB in the last 72 hours. Anemia Panel: No results for input(s): VITAMINB12, FOLATE, FERRITIN, TIBC, IRON, RETICCTPCT in the last 72 hours. Urine analysis:    Component Value Date/Time   COLORURINE YELLOW (A) 06/15/2019 1027   APPEARANCEUR CLEAR (A) 06/15/2019 1027   LABSPEC 1.015 06/15/2019 1027   PHURINE 7.0 06/15/2019 1027   GLUCOSEU NEGATIVE 06/15/2019 1027   HGBUR NEGATIVE 06/15/2019 1027   BILIRUBINUR NEGATIVE  06/15/2019 1027   KETONESUR NEGATIVE 06/15/2019 1027   PROTEINUR NEGATIVE 06/15/2019 1027   NITRITE NEGATIVE 06/15/2019 1027   LEUKOCYTESUR NEGATIVE 06/15/2019 1027    Radiological Exams on Admission: CT HEAD WO CONTRAST  Result Date: 06/15/2019  CLINICAL DATA:  Altered mental status. EXAM: CT HEAD WITHOUT CONTRAST TECHNIQUE: Contiguous axial images were obtained from the base of the skull through the vertex without intravenous contrast. COMPARISON:  May 08, 2019. FINDINGS: Brain: Increased left frontal lobe low density with sulcal effacement is noted concerning for infarction or edema secondary to underlying metastatic lesion. 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 Ventricular size is within normal limits. No definite evidence of hemorrhage is noted. No midline shift is noted. Vascular: No hyperdense vessel or unexpected calcification. Skull: Normal. Negative for fracture or focal lesion. Sinuses/Orbits: Bilateral ethmoid and sphenoid sinusitis. Other: None. IMPRESSION: Increased left frontal lobe low density with sulcal effacement is noted concerning for infarction or edema secondary to underlying metastatic lesion. MRI with and without gadolinium is recommended for further evaluation. Electronically Signed   By: Marijo Conception M.D.   On: 06/15/2019 16:57   CT HEAD W CONTRAST  Result Date: 06/15/2019 CLINICAL DATA:  Lung cancer with abnormal noncontrast CT EXAM: CT HEAD WITH CONTRAST TECHNIQUE: Contiguous axial images were obtained from the base of the skull through the vertex with intravenous contrast. CONTRAST:  79mL OMNIPAQUE IOHEXOL 300 MG/ML  SOLN COMPARISON:  Earlier same day, contrast CT 05/08/2019 FINDINGS: Brain: Increase in size of left frontal parenchymal lesion now measuring 14 x 8 mm (previously 11 x 8 mm). Surrounding edema has also increased. Additional more apparent but similar size 7 mm right frontal lesion (series 4, image 15). Possible additional subcentimeter right parietal  lesions (series 2, image 22 and series 4, image 9); the latter was present on the prior study and is unchanged. Chronic left parietal infarction. No significant mass effect. No hydrocephalus. Vascular: Unremarkable. Skull: No new findings. Sinuses/Orbits: No acute finding. Other: None. IMPRESSION: Increase in size of left frontal metastasis and associated edema. Additional three subcentimeter lesions as described. Electronically Signed   By: Macy Mis M.D.   On: 06/15/2019 20:27   DG Chest Portable 1 View  Result Date: 06/15/2019 CLINICAL DATA:  Fever, metastatic lung cancer EXAM: PORTABLE CHEST 1 VIEW COMPARISON:  None. FINDINGS: Left perihilar density likely reflecting partially visualized mass. There is mild elevation of the left hemidiaphragm. Mild nonspecific interstitial prominence. Heart size is likely within normal limits for portable technique. Left chest wall ICD is present. Right chest wall port catheter tip overlies cavoatrial junction. IMPRESSION: Left perihilar density likely reflecting partially visualized mass. Mild elevation of the left hemidiaphragm. Mild bibasilar atelectasis and nonspecific interstitial prominence. Electronically Signed   By: Macy Mis M.D.   On: 06/15/2019 18:42    EKG: Independently reviewed.   Assessment/Plan Principal Problem:   Acute focal neurological deficit     Acute metabolic encephalopathy   Generalized weakness   Bone metastases (HCC)   Stage 4 malignant neoplasm of lung (HCC)   Brain metastases ([place]) -Patient presenting with new onset confusion, unsteady gait, decreased speech and decreased interaction -CT head without contrast initially suggested infarct versus edema secondary to metastatic lesions -CT head with contrast as advised by Dr. Rogue Bussing showed "Increase in size of left frontal metastasis and associated edema". -Change in mental status based on CT head appears to be related to edema more so than stroke -Continue Decadron IV  6mg  every 6 hours.  Patient got a one-time dose of 10 mg in the emergency room per oncology recommendations.  -Continue statin and Eliquis -Oncology consult in the a.m. -Neurochecks every 4 -Fall and aspiration precautions -    Coronary artery disease involving native  coronary artery of native heart without angina pectoris -Not currently on antiplatelets, possibly as she is on apixaban -Continue atorvastatin and metoprolol    Chronic HFrEF (heart failure with reduced ejection fraction) (HCC)   Ischemic cardiomyopathy   Cardiac defibrillator in place -Appears euvolemic -Continue furosemide, lisinopril, metoprolol    History of atrial fibrillation   History of cardioversion -Patient had rapid A. fib during her hospitalization a couple weeks prior and underwent DC cardioversion -Currently in sinus -We will continue Eliquis now that platelet count over 50,000, per secure chart discussion with Dr. Janese Banks --monitor platelets -Continue metoprolol  Chemotherapy-induced thrombocytopenia -Patient was restarted on Eliquis -Monitor platelets    Diabetes mellitus without complication ([place]) -Given reports of poor oral intake at home, will hold all home oral hypoglycemic agents -Sliding scale insulin    Essential hypertension -Continue home meds of lisinopril, metoprolol    H/O thyroidectomy -Continue levothyroxine   Foley catheter in place for recent urinary retention -Foley catheter care and reassess need for catheter  DVT prophylaxis: On eliquis, restarted after four days off Code Status: full code  Family Communication:  Son, Megan Shelton  Disposition Plan: Back to previous home environment Consults called: hematology  Status obs    Athena Masse MD [facility]     06/15/2019, 8:42 PM

## 2019-06-16 DIAGNOSIS — C3490 Malignant neoplasm of unspecified part of unspecified bronchus or lung: Secondary | ICD-10-CM

## 2019-06-16 DIAGNOSIS — C7951 Secondary malignant neoplasm of bone: Secondary | ICD-10-CM

## 2019-06-16 DIAGNOSIS — C7931 Secondary malignant neoplasm of brain: Secondary | ICD-10-CM

## 2019-06-16 DIAGNOSIS — R4182 Altered mental status, unspecified: Secondary | ICD-10-CM

## 2019-06-16 DIAGNOSIS — D6959 Other secondary thrombocytopenia: Secondary | ICD-10-CM

## 2019-06-16 DIAGNOSIS — R410 Disorientation, unspecified: Secondary | ICD-10-CM

## 2019-06-16 DIAGNOSIS — R339 Retention of urine, unspecified: Secondary | ICD-10-CM

## 2019-06-16 DIAGNOSIS — Z978 Presence of other specified devices: Secondary | ICD-10-CM

## 2019-06-16 DIAGNOSIS — D61818 Other pancytopenia: Secondary | ICD-10-CM

## 2019-06-16 LAB — BASIC METABOLIC PANEL
Anion gap: 8 (ref 5–15)
BUN: 9 mg/dL (ref 8–23)
CO2: 22 mmol/L (ref 22–32)
Calcium: 7.2 mg/dL — ABNORMAL LOW (ref 8.9–10.3)
Chloride: 103 mmol/L (ref 98–111)
Creatinine, Ser: 0.32 mg/dL — ABNORMAL LOW (ref 0.44–1.00)
GFR calc Af Amer: >60 mL/min (ref >60)
GFR calc non Af Amer: >60 mL/min (ref >60)
Glucose, Bld: 200 mg/dL — ABNORMAL HIGH (ref 70–99)
Potassium: 3.6 mmol/L (ref 3.5–5.1)
Sodium: 133 mmol/L — ABNORMAL LOW (ref 135–145)

## 2019-06-16 LAB — GLUCOSE, CAPILLARY
Glucose-Capillary: 187 mg/dL — ABNORMAL HIGH (ref 70–99)
Glucose-Capillary: 297 mg/dL — ABNORMAL HIGH (ref 70–99)
Glucose-Capillary: 255 mg/dL — ABNORMAL HIGH (ref 70–99)
Glucose-Capillary: 258 mg/dL — ABNORMAL HIGH (ref 70–99)

## 2019-06-16 LAB — URINE CULTURE: Culture: NO GROWTH

## 2019-06-16 LAB — CBC
HCT: 36.6 % (ref 36.0–46.0)
Hemoglobin: 12.2 g/dL (ref 12.0–15.0)
MCH: 27.4 pg (ref 26.0–34.0)
MCHC: 33.3 g/dL (ref 30.0–36.0)
MCV: 82.2 fL (ref 80.0–100.0)
Platelets: 62 10*3/uL — ABNORMAL LOW (ref 150–400)
RBC: 4.45 MIL/uL (ref 3.87–5.11)
RDW: 15.9 % — ABNORMAL HIGH (ref 11.5–15.5)
WBC: 3 10*3/uL — ABNORMAL LOW (ref 4.0–10.5)
nRBC: 0 % (ref 0.0–0.2)

## 2019-06-16 LAB — PROTIME-INR
INR: 1.2 (ref 0.8–1.2)
Prothrombin Time: 15.2 seconds (ref 11.4–15.2)

## 2019-06-16 MED ORDER — ORAL CARE MOUTH RINSE
15.0000 mL | Freq: Two times a day (BID) | OROMUCOSAL | Status: DC
  Administered 2019-06-16 – 2019-06-18 (×5): 15 mL via OROMUCOSAL

## 2019-06-16 MED ORDER — DEXAMETHASONE SODIUM PHOSPHATE 4 MG/ML IJ SOLN
4.0000 mg | Freq: Four times a day (QID) | INTRAMUSCULAR | Status: DC
  Administered 2019-06-16 – 2019-06-19 (×11): 4 mg via INTRAVENOUS
  Filled 2019-06-16 (×12): qty 1

## 2019-06-16 MED ORDER — CHLORHEXIDINE GLUCONATE CLOTH 2 % EX PADS
6.0000 | MEDICATED_PAD | Freq: Every day | CUTANEOUS | Status: DC
  Administered 2019-06-16 – 2019-06-18 (×3): 6 via TOPICAL

## 2019-06-16 NOTE — Progress Notes (Signed)
Patient is well-known to my department recently completed palliative radiation therapy to her left humerus as well as whole brain radiation 3000 cGy in 10 fractions.  Admitted for altered mental status and CT scan on my review so significant edema with also minimal increase in size of previously seen lesions.  I believe the increase in size could be related to minimal increased in size from previous CT scan to time of whole brain radiation delivery.  Would treat with steroids.  Dr. Janese Banks is also considering immunotherapy which I believe is appropriate since this does clear across the blood-brain barrier.  Would not offer SRS or whole brain radiation at this time.

## 2019-06-16 NOTE — Consult Note (Addendum)
Hematology/Oncology Consult note [facility] [phone] [phone]  Patient Care Team: Rubye Beach as PCP - General (Family Medicine) Deboraha Sprang, MD as PCP - Cardiology (Cardiology) Telford Nab, RN as Registered Nurse   Name of the patient: Megan Shelton  [id number]  1941/03/27    Reason for referral-metastatic lung cancer with brain metastases   Requesting physician: Dr. 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  She received cycle 1 of carbo Alimta and Keytruda on 06/04/2019.  Prior to that she received whole brain radiation which she completed on 05/29/2019.  Patient was seen in the oncology clinic for worsening confusion that has been ongoing for the last 2 days.  At baseline patient is well aware of her health issues and can answer all pertinent questions correctly.  However over the last 2 days patient was getting increasingly confused.  She could not name her sons and daughters correctly.  She could not elucidate what she had for breakfast or lunch nor could she answer questions regarding month or year correctly.  She was found to have 800 cc of urine in the oncology clinic on bladder scan after not being able to void for more than 12 hours.  Foley was inserted.  Urinalysis was negative.  Patient family had reported subjective fevers about 100.4 2 days ago.  Blood cultures were drawn at oncology clinic which were negative.  Patient was started on Eliquis about 3 to 4 weeks ago for an episode of A. fib.  This was kept on hold for the last 5 days after the platelet counts dropped down to 32 after chemotherapy.    ECOG PS- 2  Pain scale- 0   Review of systems-patient denies any pain or discomfort presently but history is limited as patient does not consistently give correct answers.    Review of Systems  Unable to perform ROS: Mental status change    Allergies  Allergen Reactions  . Latex Rash  . Nickel Rash    Patient Active Problem List   Diagnosis Date Noted  . Foley catheter in place on admission 06/16/2019  . Chemotherapy-induced thrombocytopenia 06/16/2019  . Urinary retention 06/15/2019  . Altered mental status 06/15/2019  . Acute metabolic encephalopathy 31/51/7616  . History of cardioversion 06/15/2019  . History of atrial fibrillation 06/15/2019  . Generalized weakness 06/15/2019  . Acute focal neurological deficit 06/15/2019  . Brain metastases ([place])   . Wheeze   . Stage 4 malignant neoplasm of lung ([place]) 05/11/2019  . Atrial fibrillation with RVR ([place]) 05/11/2019  . Goals of care, counseling/discussion 04/30/2019  . Bone metastases ([place]) 04/30/2019  . Cardiac defibrillator in place 04/20/2019  . Eyelid dermatitis, allergic/contact- bilateral  02/25/2019  . Closed displaced transverse fracture of shaft of left humerus 02/20/2019  . Coronary artery disease involving native coronary artery of native heart without angina pectoris 01/15/2019  . Chronic HFrEF (heart failure with reduced ejection fraction) ([place]) 01/15/2019  . Ischemic cardiomyopathy 01/15/2019  . Hyperlipidemia LDL goal <70 01/15/2019  . GAD (generalized anxiety disorder) 05/16/2018  . Allergic rhinitis 05/03/2017  . Arthritis 05/03/2017  . Cataract 05/03/2017  . Congestive heart failure ([place]) 05/03/2017  . Diabetes mellitus without complication ([place]) 07/37/1062  . Essential hypertension 05/03/2017  . History of heart attack 05/03/2017  . H/O thyroidectomy 05/03/2017  . H/O total  knee replacement 05/03/2017     Past Medical History:  Diagnosis Date  . AICD (automatic cardioverter/defibrillator) present   . Anxiety   . CHF (congestive heart failure) ([place])   . Diabetes mellitus with neuropathy ([place])   . Dyspnea   . Hypertension   . Hypertensive heart disease   .  Implantable cardioverter-defibrillator (ICD)-[employer]   . Myocardial infarction, anterior wall ([place])   . Obesity, Class II, BMI 35-39.9      Past Surgical History:  Procedure Laterality Date  . CARDIOVERSION N/A 05/12/2019   Procedure: CARDIOVERSION;  Surgeon: Nelva Bush, MD;  Location: [facility] ORS;  Service: Cardiovascular;  Laterality: N/A;  . CARPAL TUNNEL RELEASE    . cataract surgery    . EYE SURGERY    . FRACTURE SURGERY Left    ARM  . JOINT REPLACEMENT Left    tkr  . PORTA CATH INSERTION N/A 05/14/2019   Procedure: PORTA CATH INSERTION;  Surgeon: Algernon Huxley, MD;  Location: [facility];  Service: Cardiovascular;  Laterality: N/A;  . VIDEO BRONCHOSCOPY WITH ENDOBRONCHIAL ULTRASOUND N/A 05/11/2019   Procedure: VIDEO BRONCHOSCOPY WITH ENDOBRONCHIAL ULTRASOUND;  Surgeon: Tyler Pita, MD;  Location: [facility] ORS;  Service: Pulmonary;  Laterality: N/A;    Social History   Socioeconomic History  . Marital status: Widowed    Spouse name: Not on file  . Number of children: 3  . Years of education: Not on file  . Highest education level: High school graduate  Occupational History  . Occupation: retired  Tobacco Use  . Smoking status: Former Smoker    Packs/day: 1.00    Years: 55.00    Pack years: 55.00    Types: Cigarettes    Quit date: 2006    Years since quitting: 15.1  . Smokeless tobacco: Never Used  . Tobacco comment: quit about 18 years ago; as of 2019  Substance and Sexual Activity  . Alcohol use: No  . Drug use: No  . Sexual activity: Not on file  Other Topics Concern  . Not on file  Social History Narrative  . Not on file   Social Determinants of Health   Financial Resource Strain:   . Difficulty of Paying Living Expenses: Not on file  Food Insecurity:   . Worried About Charity fundraiser in the Last Year: Not on file  . Ran Out of Food in the Last Year: Not on file  Transportation Needs:   . Lack of Transportation (Medical): Not on file   . Lack of Transportation (Non-Medical): Not on file  Physical Activity: Inactive  . Days of Exercise per Week: 0 days  . Minutes of Exercise per Session: 0 min  Stress: No Stress Concern Present  . Feeling of Stress : Not at all  Social Connections: Unknown  . Frequency of Communication with Friends and Family: Patient refused  . Frequency of Social Gatherings with Friends and Family: Patient refused  . Attends Religious Services: Patient refused  . Active Member of Clubs or Organizations: Patient refused  . Attends Archivist Meetings: Patient refused  . Marital Status: Patient refused  Intimate Partner Violence: Unknown  . Fear of Current or Ex-Partner: Patient refused  . Emotionally Abused: Patient refused  . Physically Abused: Patient refused  . Sexually Abused: Patient refused     Family History  Problem Relation Age of Onset  . Scleroderma Mother   . Raynaud syndrome Mother   . Cancer Father 27  colon cancer  . Heart disease Brother        died of heart attack  . Diabetes Brother      Current Facility-Administered Medications:  .  acetaminophen (TYLENOL) tablet 650 mg, 650 mg, Oral, Q6H PRN **OR** acetaminophen (TYLENOL) suppository 650 mg, 650 mg, Rectal, Q6H PRN, Judd Gaudier V, MD .  albuterol (PROVENTIL) (2.5 MG/3ML) 0.083% nebulizer solution 2.5 mg, 2.5 mg, Inhalation, Q6H PRN, Athena Masse, MD .  apixaban (ELIQUIS) tablet 5 mg, 5 mg, Oral, BID, Athena Masse, MD, 5 mg at 06/16/19 0846 .  atorvastatin (LIPITOR) tablet 40 mg, 40 mg, Oral, Daily, Judd Gaudier V, MD, 40 mg at 06/16/19 0845 .  Chlorhexidine Gluconate Cloth 2 % PADS 6 each, 6 each, Topical, Daily, Vashti Hey, MD, 6 each at 06/16/19 1100 .  dexamethasone (DECADRON) injection 6 mg, 6 mg, Intravenous, Q6H, Athena Masse, MD, 6 mg at 06/16/19 1205 .  insulin aspart (novoLOG) injection 0-15 Units, 0-15 Units, Subcutaneous, TID WC, Athena Masse, MD, 8 Units at  06/16/19 1206 .  levothyroxine (SYNTHROID) tablet 100 mcg, 100 mcg, Oral, Q0600, Athena Masse, MD, 100 mcg at 06/16/19 0602 .  lisinopril (ZESTRIL) tablet 10 mg, 10 mg, Oral, Daily, Athena Masse, MD .  MEDLINE mouth rinse, 15 mL, Mouth Rinse, q12n4p, Athena Masse, MD, 15 mL at 06/16/19 1207 .  metoprolol tartrate (LOPRESSOR) tablet 50 mg, 50 mg, Oral, BID, Athena Masse, MD, 50 mg at 06/16/19 0122 .  ondansetron (ZOFRAN) tablet 4 mg, 4 mg, Oral, Q6H PRN **OR** ondansetron (ZOFRAN) injection 4 mg, 4 mg, Intravenous, Q6H PRN, Judd Gaudier V, MD .  oxyCODONE (Oxy IR/ROXICODONE) immediate release tablet 5 mg, 5 mg, Oral, Q4H PRN, Athena Masse, MD .  senna-docusate (Senokot-S) tablet 1 tablet, 1 tablet, Oral, QHS PRN, Athena Masse, MD   Physical exam:  Vitals:   06/15/19 2109 06/15/19 2130 06/15/19 2208 06/16/19 0807  BP: (!) 145/79 132/64 123/65 99/63  Pulse: 88 81 85 70  Resp: 20 (!) '22 20 18  ' Temp:   98.7 F (37.1 C) 98.4 F (36.9 C)  TempSrc:   Oral Oral  SpO2: 97% 96% 96% 92%  Weight:      Height:       Physical Exam HENT:     Head: Normocephalic and atraumatic.  Eyes:     Pupils: Pupils are equal, round, and reactive to light.  Cardiovascular:     Rate and Rhythm: Normal rate and regular rhythm.     Heart sounds: Normal heart sounds.  Pulmonary:     Effort: Pulmonary effort is normal.     Breath sounds: Normal breath sounds.  Abdominal:     General: Bowel sounds are normal.     Palpations: Abdomen is soft.  Musculoskeletal:     Cervical back: Normal range of motion.  Skin:    General: Skin is warm and dry.  Neurological:     General: No focal deficit present.     Mental Status: She is alert and oriented to person, place, and time.     Comments: Alert and oriented to self but not accurately to place and time        CMP Latest Ref Rng & Units 06/16/2019  Glucose 70 - 99 mg/dL 200(H)  BUN 8 - 23 mg/dL 9  Creatinine 0.44 - 1.00 mg/dL 0.32(L)  Sodium  135 - 145 mmol/L 133(L)  Potassium 3.5 - 5.1 mmol/L 3.6  Chloride 98 - 111 mmol/L 103  CO2 22 - 32 mmol/L 22  Calcium 8.9 - 10.3 mg/dL 7.2(L)  Total Protein 6.5 - 8.1 g/dL -  Total Bilirubin 0.3 - 1.2 mg/dL -  Alkaline Phos 38 - 126 U/L -  AST 15 - 41 U/L -  ALT 0 - 44 U/L -   CBC Latest Ref Rng & Units 06/16/2019  WBC 4.0 - 10.5 K/uL 3.0(L)  Hemoglobin 12.0 - 15.0 g/dL 12.2  Hematocrit 36.0 - 46.0 % 36.6  Platelets 150 - 400 K/uL 62(L)    '@IMAGES' @  CT HEAD WO CONTRAST  Result Date: 06/15/2019 CLINICAL DATA:  Altered mental status. EXAM: CT HEAD WITHOUT CONTRAST TECHNIQUE: Contiguous axial images were obtained from the base of the skull through the vertex without intravenous contrast. COMPARISON:  May 08, 2019. FINDINGS: Brain: Increased left frontal lobe low density with sulcal effacement is noted concerning for infarction or edema secondary to underlying metastatic lesion. 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 Ventricular size is within normal limits. No definite evidence of hemorrhage is noted. No midline shift is noted. Vascular: No hyperdense vessel or unexpected calcification. Skull: Normal. Negative for fracture or focal lesion. Sinuses/Orbits: Bilateral ethmoid and sphenoid sinusitis. Other: None. IMPRESSION: Increased left frontal lobe low density with sulcal effacement is noted concerning for infarction or edema secondary to underlying metastatic lesion. MRI with and without gadolinium is recommended for further evaluation. Electronically Signed   By: Marijo Conception M.D.   On: 06/15/2019 16:57   CT HEAD W CONTRAST  Result Date: 06/15/2019 CLINICAL DATA:  Lung cancer with abnormal noncontrast CT EXAM: CT HEAD WITH CONTRAST TECHNIQUE: Contiguous axial images were obtained from the base of the skull through the vertex with intravenous contrast. CONTRAST:  54m OMNIPAQUE IOHEXOL 300 MG/ML  SOLN COMPARISON:  Earlier same day, contrast CT 05/08/2019 FINDINGS: Brain: Increase in size  of left frontal parenchymal lesion now measuring 14 x 8 mm (previously 11 x 8 mm). Surrounding edema has also increased. Additional more apparent but similar size 7 mm right frontal lesion (series 4, image 15). Possible additional subcentimeter right parietal lesions (series 2, image 22 and series 4, image 9); the latter was present on the prior study and is unchanged. Chronic left parietal infarction. No significant mass effect. No hydrocephalus. Vascular: Unremarkable. Skull: No new findings. Sinuses/Orbits: No acute finding. Other: None. IMPRESSION: Increase in size of left frontal metastasis and associated edema. Additional three subcentimeter lesions as described. Electronically Signed   By: PMacy MisM.D.   On: 06/15/2019 20:27   DG Chest Portable 1 View  Result Date: 06/15/2019 CLINICAL DATA:  Fever, metastatic lung cancer EXAM: PORTABLE CHEST 1 VIEW COMPARISON:  None. FINDINGS: Left perihilar density likely reflecting partially visualized mass. There is mild elevation of the left hemidiaphragm. Mild nonspecific interstitial prominence. Heart size is likely within normal limits for portable technique. Left chest wall ICD is present. Right chest wall port catheter tip overlies cavoatrial junction. IMPRESSION: Left perihilar density likely reflecting partially visualized mass. Mild elevation of the left hemidiaphragm. Mild bibasilar atelectasis and nonspecific interstitial prominence. Electronically Signed   By: PMacy MisM.D.   On: 06/15/2019 18:42    Assessment and plan- Patient is a 79y.o. female with stage IV EGFR positive lung cancer s/p cycle 1 of carboplatin Alimta and Keytruda on 06/04/2019 admitted for acute encephalopathy   Acute encephalopathy: Repeat CT head shows mild increase in the size of the left frontal mass from 8  x 11 mm to 8 x 14 mm with associated edema.  Patient was also noted to have 2 other brain lesions from before which have remained stable and a possible  subcentimeter right parietal lesion.  I did get in touch with Dr. Lacinda Axon from neurosurgery and he does not see any role for acute surgical intervention.  I also personally spoke to Dr. Baruch Gouty.  No repeat radiation treatment plan patient just completed her radiation treatment about 2 weeks ago.  Continue IV steroids while she is inpatient and she can be discharged on oral steroids 8 mg Decadron twice daily along with PPI prophylaxis.   There is no other reversible cause for acute encephalopathy apparent other than increase in the size of the left frontal lesion with surrounding edema.  I am hoping mental status improves after the start of steroids.  Infectious work-up has been negative so far.  Chemo does not have a good blood-brain barrier penetrance and I do not feel that this is acute encephalopathy secondary to chemotherapy.  I also discussed with patient's daughter at length about proceeding with Tagrisso for EGFR positive lung cancer which has good CNS activity and can further assist with stability and shrinkage of these brain lesions.  They already have 1 month supply for the same.  Again discussed risks and benefits of Tagrisso including all but not limited to skin rash, diarrhea, QTC prolongation, heart failure which requires careful monitoring.  Her baseline echocardiogram showed an EF of 45 to 50% which we will monitor closely.  Treatment will be given with a palliative intent  Pancytopenia: Likely secondary to chemotherapy.  Her white cell count has improved and her platelets have also improved from 32-62 today.  Counts are likely to normalize in the next 1 to 2 weeks.  Hemoglobin is mildly lower as compared to her baseline of 14 down to 12.2.  Continue to monitor.  She will not be receiving any further chemotherapy and will be switching to second line for treatment with Tagrisso  As platelet counts have been more than 50 would be okay to restart her Eliquis which she was started on for atrial  fibrillation.  Recommend DC Foley catheter and trial of void. OT/PT and discharge planning.   I will follow up with the patient next week in my clinic   Total face to face encounter time for this patient visit was 40 min.  Total non face to face encounter time for this patient on the day of the visit was 20 min. Time spent in  discussing patients case with Dr. Lacinda Axon and Dr. Baruch Gouty      Visit Diagnosis 1. Stage 4 malignant neoplasm of lung, unspecified laterality ([place])   2. Altered mental status, unspecified altered mental status type     Dr. Randa Evens, MD, MPH [facility] at [facility] [phone] 06/16/2019 1:09 PM

## 2019-06-16 NOTE — Evaluation (Signed)
Physical Therapy Evaluation Patient Details Name: Megan Shelton MRN: [id number] DOB: 05-Jul-1940 Today's Date: 06/16/2019   History of Present Illness  79 year old female with DM2, HFrEF status post ICD, CAD, A. Fib and stage IV metastatic lung CA with mets to bone and brain was admitted 06/15/2019 with altered mental status and weakness. Head CT showed increased in size of left frontal mets and associated edema.    Clinical Impression  Pt pleasantly confused t/o the session, but able to follow simple cuing promptly and appropriately.  Per notes is seems as though she uses cane at baseline, but for the 200 ft of ambulation we did RW this date (will likely trial [facility] next session).  Pt was able to circumambulate the nurses' station with consistent speed, good confidence and no overt safety issues.  Her HR increased from the 90s at rest to 120s and O2 appeared to slowly drop from mid 90s to high 80s on room air (pulse ox inconsistently reading).  Pt reported little to no fatigue with the effort and again looked confidence and did not have LOBs or other safety issues apart from needing constant directional cuing.  Pt did have L UE weakness, though unsure how this compares to her baseline. Will benefit from HHPT and per further gait training may benefit from rolling walker as well if she does not currently have one available.   Follow Up Recommendations Home health PT    Equipment Recommendations  Rolling walker with 5" wheels    Recommendations for Other Services       Precautions / Restrictions Precautions Precautions: Fall Restrictions Weight Bearing Restrictions: No      Mobility  Bed Mobility Overal bed mobility: Modified Independent             General bed mobility comments: With heavy use of rail pt was able to get to EOB w/o assist  Transfers Overall transfer level: Modified independent Equipment used: Rolling walker (2 wheeled)             General transfer comment: Pt  able to rise w/o hesitation, overall showed good confidence/safety using walker  Ambulation/Gait Ambulation/Gait assistance: Supervision Gait Distance (Feet): 200 Feet Assistive device: Rolling walker (2 wheeled)       General Gait Details: Pt with relatively consistent and appropriate cadence and speed.  She was not overly reliant on the walker and did not endorse any fatigue with regular questioning.  She did have elevated HR into the 120s and though O2 monitor was not working well it seems likely that she dropping into the 80s% on room air during the effort.    Stairs            Wheelchair Mobility    Modified Rankin (Stroke Patients Only)       Balance Overall balance assessment: Mild deficits observed, not formally tested                                           Pertinent Vitals/Pain Pain Assessment: No/denies pain    Home Living Family/patient expects to be discharged to:: Private residence Living Arrangements: Children Available Help at Discharge: Family           Home Equipment: Kasandra Knudsen - single point(per documentation, unable to report) Additional Comments: Pt unable to answer questions about home situation.    Prior Function Level of Independence: Independent with assistive device(s)(per documantation)  Comments: pt unable to report PLOF, though apparently she needs only cane and can ambulate in the home ad lib?     Hand Dominance        Extremity/Trunk Assessment   Upper Extremity Assessment Upper Extremity Assessment: Generalized weakness;Overall WFL for tasks assessed(L UE grossly 3+/5, R 4/5)    Lower Extremity Assessment Lower Extremity Assessment: Generalized weakness;Overall WFL for tasks assessed(generally = bilaterally)       Communication   Communication: No difficulties  Cognition Arousal/Alertness: Awake/alert Behavior During Therapy: Flat affect Overall Cognitive Status: Impaired/Different from  baseline                                 General Comments: unsure of actual baseline, but apparently greatly altered at this point      General Comments General comments (skin integrity, edema, etc.): Pt confused t/o session, but able to follow basic cuing w/o issue    Exercises     Assessment/Plan    PT Assessment Patient needs continued PT services  PT Problem List         PT Treatment Interventions DME instruction;Gait training;Stair training;Functional mobility training;Therapeutic exercise;Therapeutic activities;Balance training;Cognitive remediation;Patient/family education;Neuromuscular re-education    PT Goals (Current goals can be found in the Care Plan section)  Acute Rehab PT Goals Patient Stated Goal: none stated PT Goal Formulation: Patient unable to participate in goal setting Time For Goal Achievement: 06/30/19 Potential to Achieve Goals: Good    Frequency Min 2X/week   Barriers to discharge        Co-evaluation               AM-PAC PT "6 Clicks" Mobility  Outcome Measure Help needed turning from your back to your side while in a flat bed without using bedrails?: None Help needed moving from lying on your back to sitting on the side of a flat bed without using bedrails?: None Help needed moving to and from a bed to a chair (including a wheelchair)?: None Help needed standing up from a chair using your arms (e.g., wheelchair or bedside chair)?: None Help needed to walk in hospital room?: None Help needed climbing 3-5 steps with a railing? : A Little 6 Click Score: 23    End of Session Equipment Utilized During Treatment: Gait belt Activity Tolerance: Patient tolerated treatment well Patient left: with chair alarm set;with call bell/phone within reach Nurse Communication: Mobility status(HR and O2 during ambulation) PT Visit Diagnosis: Muscle weakness (generalized) (M62.81);Difficulty in walking, not elsewhere classified  (R26.2);Other symptoms and signs involving the nervous system (R29.898)    Time: 2703-5009 PT Time Calculation (min) (ACUTE ONLY): 21 min   Charges:   PT Evaluation $PT Eval Low Complexity: 1 Low PT Treatments $Gait Training: 8-22 mins        Kreg Shropshire, DPT 06/16/2019, 3:08 PM

## 2019-06-16 NOTE — Progress Notes (Signed)
PROGRESS NOTE    Megan Shelton  ZOX:[id number]  DOB: 28-Aug-1940  DOA: 06/15/2019 PCP: Mar Daring, PA-C Outpatient Specialists:   Hospital course:  79 year old female with DM2, HFrEF status post ICD, CAD, A. 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  Work-up reveals acute urinary retention in oncology office.  In the ED, head CT showed increased in size of left frontal mets and associated edema.  Patient was started on Decadron.  Subjective:  Patient was sitting up in bed eating breakfast when I went in to see her.  She thinks she is doing fine.  However she does not know how she got here or why she is in the hospital.  She believes that she is in [place] and the year is 2019.  She has no questions.  She has no complaints.  She denies headache chest pain abdominal pain or back pain.   Objective: Vitals:   06/15/19 2109 06/15/19 2130 06/15/19 2208 06/16/19 0807  BP: (!) 145/79 132/64 123/65 99/63  Pulse: 88 81 85 70  Resp: 20 (!) 22 20 18   Temp:   98.7 F (37.1 C) 98.4 F (36.9 C)  TempSrc:   Oral Oral  SpO2: 97% 96% 96% 92%  Weight:      Height:        Intake/Output Summary (Last 24 hours) at 06/16/2019 1316 Last data filed at 06/16/2019 1014 Gross per 24 hour  Intake 740 ml  Output --  Net 740 ml   Filed Weights   06/15/19 1620  Weight: 68 kg     Assessment & Plan:   Altered mental status Secondary to increasing size of brain metastasis with edema. Started on Decadron 6 q 6 06/15/2019 Dr. Janese Banks, patient's oncologist contacted neurosurgery and radiation oncology who had no further recommendations. Plan is to discharge home on Decadron when stable.,  Perhaps tomorrow.  Acute urinary retention Patient apparently had 800 cc in her bladder in oncology clinic, Foley was placed. UA here is negative We will need to watch for postobstructive diuresis today, strict I's and O's  ordered. Plan is for voiding trial tomorrow prior to discharge. Family is aware she may have to go home with a Foley if she is unable to void on her own.  HTN BP somewhat low this morning, will hold Lasix Continue metoprolol and lisinopril for now although lisinopril may need to be held if she proves to be intravascularly volume depleted.  Creatinine ordered for tomorrow. Lasix can be restarted if blood pressure rebounds, of note patient has had decreased p.o. intake  DM2 CBG with SSI AC at bedtime  CAD Continue atorvastatin and metoprolol No antiplatelets, likely because she is on Eliquis for A. Fib  Atrial fibrillation Metoprolol for rate control although she is presently in sinus status post cardioversion last month. Eliquis for secondary prevention of stroke  HFrEF Euvolemic, continue home doses of metoprolol  I am holding patient's Lasix as noted above.  Hypothyroidism Continue Synthroid   DVT prophylaxis: On Eliquis Code Status: DNR Family Communication: Dr. Janese Banks has been in extensive communication with patient's family and son Gerald Stabs Disposition Plan:   Patient is from: Home  Anticipated Discharge Location: Home  Barriers to Discharge: Acute metabolic encephalopathy  Is patient medically stable for Discharge: No   Consultants:  Oncology, Dr. Janese Banks  Procedures:  None  Antimicrobials:  None   Exam:  General: Pleasant chronically ill-appearing female sitting  up in bed eating breakfast Eyes: sclera anicteric, conjuctiva mild injection bilaterally CVS: S1-S2, regular  Respiratory:  decreased air entry bilaterally secondary to decreased inspiratory effort, rales at bases  GI: NABS, soft, NT  LE: No edema.  Neuro: A/O x 1, Moving all extremities equally with normal strength, speaking fluently with good understanding but clearly with some cognitive deficits.     Data Reviewed: Basic Metabolic Panel: Recent Labs  Lab 06/11/19 1253 06/15/19 1043  06/16/19 0353  NA 135 131* 133*  K 3.4* 3.2* 3.6  CL 100 97* 103  CO2 24 24 22   GLUCOSE 194* 143* 200*  BUN 15 10 9   CREATININE 0.42* <0.30* 0.32*  CALCIUM 7.2* 7.0* 7.2*  MG  --  1.8  --    Liver Function Tests: Recent Labs  Lab 06/15/19 1043  AST 30  ALT 32  ALKPHOS 171*  BILITOT 0.8  PROT 5.8*  ALBUMIN 2.6*   No results for input(s): LIPASE, AMYLASE in the last 168 hours. No results for input(s): AMMONIA in the last 168 hours. CBC: Recent Labs  Lab 06/11/19 1253 06/15/19 1043 06/16/19 0353  WBC 2.5* 4.0 3.0*  NEUTROABS 2.1 3.5  --   HGB 14.4 13.1 12.2  HCT 43.8 40.1 36.6  MCV 83.4 82.7 82.2  PLT 32* 53* 62*   Cardiac Enzymes: No results for input(s): CKTOTAL, CKMB, CKMBINDEX, TROPONINI in the last 168 hours. BNP (last 3 results) No results for input(s): PROBNP in the last 8760 hours. CBG: Recent Labs  Lab 06/15/19 1827 06/15/19 2032 06/16/19 0804 06/16/19 1135  GLUCAP 51* 108* 187* 297*    Recent Results (from the past 240 hour(s))  Culture, blood (routine x 2)     Status: None (Preliminary result)   Collection Time: 06/15/19 10:54 AM   Specimen: BLOOD  Result Value Ref Range Status   Specimen Description   Final    BLOOD LEFT HAND Performed at [facility], [address], [place], [place] [address]    Special Requests   Final    Blood Culture results may not be optimal due to an inadequate volume of blood received in culture bottles Performed at [facility], [address], [place], [place] [address]    Culture   Final    NO GROWTH < 24 HOURS Performed at [facility], [address], [place], [place] [address]    Report Status PENDING  Incomplete  Culture, blood (routine x 2)     Status: None (Preliminary result)   Collection Time: 06/15/19 11:05 AM   Specimen: BLOOD  Result Value Ref Range Status   Specimen Description   Final    BLOOD RIGHT HAND Performed at [facility], [address]  [place], [place] [address]    Special Requests   Final    Blood Culture adequate volume Performed at [facility], [address], [place], [place] [address]    Culture   Final    NO GROWTH < 24 HOURS Performed at [facility], [address] [place], [place] [address]    Report Status PENDING  Incomplete  Respiratory Panel by RT PCR (Flu A&B, Covid) - Nasopharyngeal Swab     Status: None   Collection Time: 06/15/19  6:22 PM   Specimen: Nasopharyngeal Swab  Result Value Ref Range Status   SARS Coronavirus 2 by RT PCR NEGATIVE NEGATIVE Final    Comment: (NOTE) SARS-CoV-2 target nucleic acids are NOT DETECTED. The SARS-CoV-2 RNA  is generally detectable in upper respiratoy specimens during the acute phase of infection. The lowest concentration of SARS-CoV-2 viral copies this assay can detect is 131 copies/mL. A negative result does not preclude SARS-Cov-2 infection and should not be used as the sole basis for treatment or other patient management decisions. A negative result may occur with  improper specimen collection/handling, submission of specimen other than nasopharyngeal swab, presence of viral mutation(s) within the areas targeted by this assay, and inadequate number of viral copies (<131 copies/mL). A negative result must be combined with clinical observations, patient history, and epidemiological information. The expected result is Negative. Fact Sheet for Patients:  PinkCheek.be Fact Sheet for Healthcare Providers:  GravelBags.it This test is not yet ap proved or cleared by the [place] FDA and  has been authorized for detection and/or diagnosis of SARS-CoV-2 by FDA under an Emergency Use Authorization (EUA). This EUA will remain  in effect (meaning this test can be used) for the duration of the COVID-19 declaration under Section 564(b)(1) of the Act, 21 U.S.C. section 360bbb-3(b)(1), unless the  authorization is terminated or revoked sooner.    Influenza A by PCR NEGATIVE NEGATIVE Final   Influenza B by PCR NEGATIVE NEGATIVE Final    Comment: (NOTE) The Xpert Xpress SARS-CoV-2/FLU/RSV assay is intended as an aid in  the diagnosis of influenza from Nasopharyngeal swab specimens and  should not be used as a sole basis for treatment. Nasal washings and  aspirates are unacceptable for Xpert Xpress SARS-CoV-2/FLU/RSV  testing. Fact Sheet for Patients: PinkCheek.be Fact Sheet for Healthcare Providers: GravelBags.it This test is not yet approved or cleared by the [place] FDA and  has been authorized for detection and/or diagnosis of SARS-CoV-2 by  FDA under an Emergency Use Authorization (EUA). This EUA will remain  in effect (meaning this test can be used) for the duration of the  Covid-19 declaration under Section 564(b)(1) of the Act, 21  U.S.C. section 360bbb-3(b)(1), unless the authorization is  terminated or revoked. Performed at [facility], [address] [place], [place] [address]       Studies: CT HEAD WO CONTRAST  Result Date: 06/15/2019 CLINICAL DATA:  Altered mental status. EXAM: CT HEAD WITHOUT CONTRAST TECHNIQUE: Contiguous axial images were obtained from the base of the skull through the vertex without intravenous contrast. COMPARISON:  May 08, 2019. FINDINGS: Brain: Increased left frontal lobe low density with sulcal effacement is noted concerning for infarction or edema secondary to underlying metastatic lesion. 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 Ventricular size is within normal limits. No definite evidence of hemorrhage is noted. No midline shift is noted. Vascular: No hyperdense vessel or unexpected calcification. Skull: Normal. Negative for fracture or focal lesion. Sinuses/Orbits: Bilateral ethmoid and sphenoid sinusitis. Other: None. IMPRESSION: Increased left frontal lobe low  density with sulcal effacement is noted concerning for infarction or edema secondary to underlying metastatic lesion. MRI with and without gadolinium is recommended for further evaluation. Electronically Signed   By: Marijo Conception M.D.   On: 06/15/2019 16:57   CT HEAD W CONTRAST  Result Date: 06/15/2019 CLINICAL DATA:  Lung cancer with abnormal noncontrast CT EXAM: CT HEAD WITH CONTRAST TECHNIQUE: Contiguous axial images were obtained from the base of the skull through the vertex with intravenous contrast. CONTRAST:  21mL OMNIPAQUE IOHEXOL 300 MG/ML  SOLN COMPARISON:  Earlier same day, contrast CT 05/08/2019 FINDINGS: Brain: Increase in size of left frontal parenchymal lesion now measuring 14 x 8 mm (previously 11  x 8 mm). Surrounding edema has also increased. Additional more apparent but similar size 7 mm right frontal lesion (series 4, image 15). Possible additional subcentimeter right parietal lesions (series 2, image 22 and series 4, image 9); the latter was present on the prior study and is unchanged. Chronic left parietal infarction. No significant mass effect. No hydrocephalus. Vascular: Unremarkable. Skull: No new findings. Sinuses/Orbits: No acute finding. Other: None. IMPRESSION: Increase in size of left frontal metastasis and associated edema. Additional three subcentimeter lesions as described. Electronically Signed   By: Macy Mis M.D.   On: 06/15/2019 20:27   DG Chest Portable 1 View  Result Date: 06/15/2019 CLINICAL DATA:  Fever, metastatic lung cancer EXAM: PORTABLE CHEST 1 VIEW COMPARISON:  None. FINDINGS: Left perihilar density likely reflecting partially visualized mass. There is mild elevation of the left hemidiaphragm. Mild nonspecific interstitial prominence. Heart size is likely within normal limits for portable technique. Left chest wall ICD is present. Right chest wall port catheter tip overlies cavoatrial junction. IMPRESSION: Left perihilar density likely reflecting partially  visualized mass. Mild elevation of the left hemidiaphragm. Mild bibasilar atelectasis and nonspecific interstitial prominence. Electronically Signed   By: Macy Mis M.D.   On: 06/15/2019 18:42     Scheduled Meds: . apixaban  5 mg Oral BID  . atorvastatin  40 mg Oral Daily  . Chlorhexidine Gluconate Cloth  6 each Topical Daily  . dexamethasone (DECADRON) injection  6 mg Intravenous Q6H  . insulin aspart  0-15 Units Subcutaneous TID WC  . levothyroxine  100 mcg Oral Q0600  . lisinopril  10 mg Oral Daily  . mouth rinse  15 mL Mouth Rinse q12n4p  . metoprolol tartrate  50 mg Oral BID   Continuous Infusions:  Principal Problem:   Acute metabolic encephalopathy Active Problems:   Diabetes mellitus without complication (HCC)   Essential hypertension   H/O thyroidectomy   Coronary artery disease involving native coronary artery of native heart without angina pectoris   Chronic HFrEF (heart failure with reduced ejection fraction) (HCC)   Ischemic cardiomyopathy   Cardiac defibrillator in place   Bone metastases ([place])   Stage 4 malignant neoplasm of lung ([place])   Brain metastases ([place])   Urinary retention   Altered mental status   History of cardioversion   History of atrial fibrillation   Generalized weakness   Acute focal neurological deficit   Foley catheter in place on admission   Chemotherapy-induced thrombocytopenia     Mandie Crabbe Tublu Lanett Lasorsa, [facility]  If 7PM-7AM, please contact night-coverage [email or web address] Password [email or web address] 06/16/2019, 1:16 PM    LOS: 1 day

## 2019-06-16 NOTE — Progress Notes (Signed)
Megan Shelton (son)  [phone]

## 2019-06-16 NOTE — Telephone Encounter (Signed)
Oral Chemotherapy Pharmacist Encounter  Patient Education I spoke with patient and her son following her OV on 06/11/19. Provided an overview of new oral chemotherapy medication: Tagrisso (osimertinib) for the treatment of metastatic lung cancer EGFR mutation positive, planned duration until disease progression or unacceptable drug toxicity.   Counseled patient on administration, dosing, side effects, monitoring, drug-food interactions, safe handling, storage, and disposal. Patient will take 1 tablet (80 mg total) by mouth daily. Patient knows the plan is to start in 2 weeks.  Patient provided with one month of Tagrisso to get started.  Side effects include but not limited to: rash, diarrhea, decreased wbc.    Reviewed with patient importance of keeping a medication schedule and plan for any missed doses.  Ms. Blasco voiced understanding and appreciation. All questions answered. Medication handout provided.  Provided patient with [facility] phone number. Patient knows to call the office with questions or concerns. Oral Chemotherapy [facility] will continue to follow.  Darl Pikes, PharmD, BCPS, BCOP, CPP Hematology/Oncology Clinical Pharmacist [facility] [phone]

## 2019-06-16 NOTE — TOC Progression Note (Signed)
Transition of Care [facility] - Progression Note    Patient Details  Name: Megan Shelton MRN: [id number] Date of Birth: May 24, 1940  Transition of Care [facility] CM/SW [place], RN Phone Number: 06/16/2019, 3:08 PM  Clinical Narrative:    Spoke with patricia the daughter on the phone, she stated that she has spoken with the oncologist and they do plan to take the patient home with [facility], I contacted [facility] and spoke with Corene Cornea about accepting the patient, they will contact me if they can accept the patient, the patient has a cane at home and will need a RW, I contacted Brad with [facility] and he brought the RW to the room for the patient to take home at DC. Will continue to monitor for needs        Expected Discharge Plan and Services                                                 Social Determinants of Health (SDOH) Interventions    Readmission Risk Interventions No flowsheet data found.

## 2019-06-16 NOTE — Progress Notes (Signed)
Pt with 3 beat Owens Loffler, NP aware via secure messaging

## 2019-06-16 NOTE — TOC Progression Note (Signed)
Transition of Care [facility]) - Progression Note    Patient Details  Name: Megan Shelton MRN: [id number] Date of Birth: 06-16-1940  Transition of Care [facility]) CM/SW [place], RN Phone Number: 06/16/2019, 3:26 PM  Clinical Narrative:     [facility] called,  and stated that they would accept the patient for PT and PT, I notified the patient's sister Mardene Celeste      Expected Discharge Plan and Services                                                 Social Determinants of Health (SDOH) Interventions    Readmission Risk Interventions No flowsheet data found.

## 2019-06-17 ENCOUNTER — Inpatient Hospital Stay: Payer: Medicare Other

## 2019-06-17 ENCOUNTER — Telehealth: Payer: Self-pay | Admitting: Internal Medicine

## 2019-06-17 LAB — GLUCOSE, CAPILLARY
Glucose-Capillary: 234 mg/dL — ABNORMAL HIGH (ref 70–99)
Glucose-Capillary: 301 mg/dL — ABNORMAL HIGH (ref 70–99)
Glucose-Capillary: 177 mg/dL — ABNORMAL HIGH (ref 70–99)
Glucose-Capillary: 261 mg/dL — ABNORMAL HIGH (ref 70–99)

## 2019-06-17 LAB — CBC
HCT: 35.5 % — ABNORMAL LOW (ref 36.0–46.0)
Hemoglobin: 12 g/dL (ref 12.0–15.0)
MCH: 27.4 pg (ref 26.0–34.0)
MCHC: 33.8 g/dL (ref 30.0–36.0)
MCV: 81.1 fL (ref 80.0–100.0)
Platelets: 107 10*3/uL — ABNORMAL LOW (ref 150–400)
RBC: 4.38 MIL/uL (ref 3.87–5.11)
RDW: 15.8 % — ABNORMAL HIGH (ref 11.5–15.5)
WBC: 4.7 10*3/uL (ref 4.0–10.5)
nRBC: 0.4 % — ABNORMAL HIGH (ref 0.0–0.2)

## 2019-06-17 LAB — BASIC METABOLIC PANEL
Anion gap: 12 (ref 5–15)
BUN: 14 mg/dL (ref 8–23)
CO2: 22 mmol/L (ref 22–32)
Calcium: 7 mg/dL — ABNORMAL LOW (ref 8.9–10.3)
Chloride: 104 mmol/L (ref 98–111)
Creatinine, Ser: 0.36 mg/dL — ABNORMAL LOW (ref 0.44–1.00)
GFR calc Af Amer: >60 mL/min (ref >60)
GFR calc non Af Amer: >60 mL/min (ref >60)
Glucose, Bld: 261 mg/dL — ABNORMAL HIGH (ref 70–99)
Potassium: 3.3 mmol/L — ABNORMAL LOW (ref 3.5–5.1)
Sodium: 138 mmol/L (ref 135–145)

## 2019-06-17 MED ORDER — POTASSIUM CHLORIDE CRYS ER 20 MEQ PO TBCR
40.0000 meq | EXTENDED_RELEASE_TABLET | Freq: Once | ORAL | Status: AC
  Administered 2019-06-17: 09:00:00 40 meq via ORAL
  Filled 2019-06-17: qty 2

## 2019-06-17 MED ORDER — PANTOPRAZOLE SODIUM 40 MG PO TBEC
40.0000 mg | DELAYED_RELEASE_TABLET | Freq: Every day | ORAL | Status: DC
  Administered 2019-06-17 – 2019-06-19 (×3): 40 mg via ORAL
  Filled 2019-06-17 (×3): qty 1

## 2019-06-17 NOTE — Telephone Encounter (Signed)
Spoke to patient's son Chris-more than 15 minutes-with an update.  Recommend continued steroids at this time; hopefully improve mentation.  Multiple concerns addressed including but not limited to elevated blood sugars/sliding scale in hospital/at discharge; urinary retention; restarting osimertinib.  Discussed my concerns for falls i if patient continues to be confused.  I am hopeful that patient's clinical condition should improve over the next few days.  Also informed patient that-it would be few days before patient hopefully get back to baseline.  They are concerned about falls/which is definitely high risk given her confusion.  We will continue to follow closely.

## 2019-06-17 NOTE — Progress Notes (Signed)
Pt due to void. Bladder scan done. 275 mls. Morrison-NP made aware. No new orders at this time.

## 2019-06-17 NOTE — Progress Notes (Signed)
Megan Shelton   DOB:April 16, 1940   DG#:[id number]    Subjective: Patient resting comfortably in the bed.  Denies any pain.  No nausea no vomiting.  ROS: Cannot be done as patient is confused.  Objective:  Vitals:   06/16/19 2341 06/17/19 0824  BP: 135/72 (!) 147/67  Pulse: 71 66  Resp: 17   Temp: 98.6 F (37 C) 97.7 F (36.5 C)  SpO2: 94% 94%     Intake/Output Summary (Last 24 hours) at 06/17/2019 1643 Last data filed at 06/17/2019 1344 Gross per 24 hour  Intake 430 ml  Output 501 ml  Net -71 ml    Physical Exam  Constitutional: She is well-developed, well-nourished, and in no distress.  HENT:  Head: Normocephalic and atraumatic.  Mouth/Throat: Oropharynx is clear and moist. No oropharyngeal exudate.  Eyes: Pupils are equal, round, and reactive to light.  Cardiovascular: Normal rate and regular rhythm.  Pulmonary/Chest: No respiratory distress. She has no wheezes.  Abdominal: Soft. Bowel sounds are normal. She exhibits no distension and no mass. There is no abdominal tenderness. There is no rebound and no guarding.  Musculoskeletal:        General: No tenderness or edema. Normal range of motion.     Cervical back: Normal range of motion and neck supple.  Neurological: She is alert.  Oriented x0-1.  Skin: Skin is warm.  Psychiatric: Affect normal.     Labs:  Lab Results  Component Value Date   WBC 4.7 06/17/2019   HGB 12.0 06/17/2019   HCT 35.5 (L) 06/17/2019   MCV 81.1 06/17/2019   PLT 107 (L) 06/17/2019   NEUTROABS 3.5 06/15/2019    Lab Results  Component Value Date   NA 138 06/17/2019   K 3.3 (L) 06/17/2019   CL 104 06/17/2019   CO2 22 06/17/2019    Studies:  CT HEAD WO CONTRAST  Result Date: 06/15/2019 CLINICAL DATA:  Altered mental status. EXAM: CT HEAD WITHOUT CONTRAST TECHNIQUE: Contiguous axial images were obtained from the base of the skull through the vertex without intravenous contrast. COMPARISON:  May 08, 2019. FINDINGS: Brain: Increased  left frontal lobe low density with sulcal effacement is noted concerning for infarction or edema secondary to underlying metastatic lesion. 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 Ventricular size is within normal limits. No definite evidence of hemorrhage is noted. No midline shift is noted. Vascular: No hyperdense vessel or unexpected calcification. Skull: Normal. Negative for fracture or focal lesion. Sinuses/Orbits: Bilateral ethmoid and sphenoid sinusitis. Other: None. IMPRESSION: Increased left frontal lobe low density with sulcal effacement is noted concerning for infarction or edema secondary to underlying metastatic lesion. MRI with and without gadolinium is recommended for further evaluation. Electronically Signed   By: Marijo Conception M.D.   On: 06/15/2019 16:57   CT HEAD W CONTRAST  Result Date: 06/15/2019 CLINICAL DATA:  Lung cancer with abnormal noncontrast CT EXAM: CT HEAD WITH CONTRAST TECHNIQUE: Contiguous axial images were obtained from the base of the skull through the vertex with intravenous contrast. CONTRAST:  55mL OMNIPAQUE IOHEXOL 300 MG/ML  SOLN COMPARISON:  Earlier same day, contrast CT 05/08/2019 FINDINGS: Brain: Increase in size of left frontal parenchymal lesion now measuring 14 x 8 mm (previously 11 x 8 mm). Surrounding edema has also increased. Additional more apparent but similar size 7 mm right frontal lesion (series 4, image 15). Possible additional subcentimeter right parietal lesions (series 2, image 22 and series 4, image 9); the latter was present on the prior  study and is unchanged. Chronic left parietal infarction. No significant mass effect. No hydrocephalus. Vascular: Unremarkable. Skull: No new findings. Sinuses/Orbits: No acute finding. Other: None. IMPRESSION: Increase in size of left frontal metastasis and associated edema. Additional three subcentimeter lesions as described. Electronically Signed   By: Macy Mis M.D.   On: 06/15/2019 20:27   DG Chest Portable 1  View  Result Date: 06/15/2019 CLINICAL DATA:  Fever, metastatic lung cancer EXAM: PORTABLE CHEST 1 VIEW COMPARISON:  None. FINDINGS: Left perihilar density likely reflecting partially visualized mass. There is mild elevation of the left hemidiaphragm. Mild nonspecific interstitial prominence. Heart size is likely within normal limits for portable technique. Left chest wall ICD is present. Right chest wall port catheter tip overlies cavoatrial junction. IMPRESSION: Left perihilar density likely reflecting partially visualized mass. Mild elevation of the left hemidiaphragm. Mild bibasilar atelectasis and nonspecific interstitial prominence. Electronically Signed   By: Macy Mis M.D.   On: 06/15/2019 18:42    Brain metastases [facility]) #79 year old female patient metastatic lung cancer to the brain currently admitted to hospital for acute onset of altered mental status  # Altered mental status-likely secondary to worsening edema status post radiation; Continue steroids 4 mg q. 6 hours.   # Metastatic lung cancer-adenocarcinoma status post carbo-Alimta Keytruda status post cycle #1.  Once acute issues resolve I think reasonable to start osimertinib given the brain penetration.  #Thrombocytopenia-secondary chemotherapy-improving; okay to continue Eliquis [A. Fib].  #Urinary retention-agree with trial of void.   #DNR/DNI; I will reach out to patient's son to discuss; Chris today.  Cammie Sickle, MD 06/17/2019  4:43 PM

## 2019-06-17 NOTE — Assessment & Plan Note (Addendum)
#  79 year old female patient metastatic lung cancer to the brain currently admitted to hospital for acute onset of altered mental status  # Altered mental status-likely secondary to worsening edema status post radiation; rather than progressive disease in the brain.  Slight improvement in mentation noted.  Decrease steroids to 4 mg 3 times daily oral; will plan to taper outpatient based on clinical response.  # Metastatic lung cancer-adenocarcinoma status post carbo-Alimta Keytruda status post cycle #1.  Hold chemotherapy; plan osimertinib once acute issues resolved.  #A. Fib-on Eliquis; need to monitor closely for falls since on anticoagulation.    #Elevated blood glucose-secondary steroids-recommend monitoring closely at home on discharge.  Discussed with primary service.  #Spoke to patient's son, Gerald Stabs who had multiple questions answered in regards to discharge medications/timing/need monitor blood sugars closely.  Also spoke to patient's daughter Mardene Celeste.  Will make arrangements for the patient to follow-up with Dr. Dorthula Rue early next week.

## 2019-06-17 NOTE — Progress Notes (Addendum)
PROGRESS NOTE    Megan Shelton  TIR:[id number] DOB: 1940-05-13 DOA: 06/15/2019 PCP: Mar Daring, PA-C       Assessment & Plan:   Principal Problem:   Acute metabolic encephalopathy Active Problems:   Diabetes mellitus without complication ([place])   Essential hypertension   H/O thyroidectomy   Coronary artery disease involving native coronary artery of native heart without angina pectoris   Chronic HFrEF (heart failure with reduced ejection fraction) (HCC)   Ischemic cardiomyopathy   Cardiac defibrillator in place   Bone metastases ([place])   Stage 4 malignant neoplasm of lung ([place])   Brain metastases ([place])   Urinary retention   Altered mental status   History of cardioversion   History of atrial fibrillation   Generalized weakness   Acute focal neurological deficit   Foley catheter in place on admission   Chemotherapy-induced thrombocytopenia  Altered mental status: secondary to increasing size of brain metastasis with edema from 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 Continue on decadron. Dr. Janese Banks, patient's oncologist contacted neurosurgery and radiation oncology who had no further recommendations. Plan is to discharge home on decadron when stable.  Acute urinary retention: d/c foley & voiding trial today.  HTN: continue on metoprolol, lisinopril. Continue to hold lasix   Hypokalemia: KCl repleated. Will continue to monitor   DM2: continue on SSI w/ accuchecks  CAD: continue atorvastatin and metoprolol. No antiplatelets  Atrial fibrillation: likely PAF. Continue on metoprolol & eliquis   Chronic systolic CHF: euvolemic, continue home doses of metoprolol. Continue to hold lasix   Hypothyroidism: continue on synthroid  Generalized weakness: PT recommends home health. Home health orders will be placed   DVT prophylaxis: eliquis  Code Status: DNR Family Communication: discussed pt's care w/ pt's daughter, Mardene Celeste & answered her  questions Disposition Plan: still confused so will likely need a couple days more   Consultants:      Procedures:    Antimicrobials:    Subjective: Pt c/o fatigue  Objective: Vitals:   06/16/19 0807 06/16/19 1523 06/16/19 2304 06/16/19 2341  BP: 99/63 125/73 132/61 135/72  Pulse: 70 67 86 71  Resp: 18 (!) 21  17  Temp: 98.4 F (36.9 C) 98.7 F (37.1 C)  98.6 F (37 C)  TempSrc: Oral Oral  Oral  SpO2: 92% 93%  94%  Weight:      Height:        Intake/Output Summary (Last 24 hours) at 06/17/2019 6761 Last data filed at 06/17/2019 0500 Gross per 24 hour  Intake 380 ml  Output 1425 ml  Net -1045 ml   Filed Weights   06/15/19 1620  Weight: 68 kg    Examination:  General exam: Appears calm and comfortable  Respiratory system: diminished breath sounds b/l. No wheezes  Cardiovascular system: S1 & S2 +. No rubs, gallops or clicks.  Gastrointestinal system: Abdomen is nondistended, soft and nontender. Normal bowel sounds heard. Central nervous system: Alert and awake. Moves all 4 extremities  Psychiatry: Judgement and insight appear abnormal. Flat mood and affect     Data Reviewed: I have personally reviewed following labs and imaging studies  CBC: Recent Labs  Lab 06/11/19 1253 06/15/19 1043 06/16/19 0353 06/17/19 0544  WBC 2.5* 4.0 3.0* 4.7  NEUTROABS 2.1 3.5  --   --   HGB 14.4 13.1 12.2 12.0  HCT 43.8 40.1 36.6 35.5*  MCV 83.4 82.7 82.2 81.1  PLT 32* 53* 62* 950*   Basic Metabolic  Panel: Recent Labs  Lab 06/11/19 1253 06/15/19 1043 06/16/19 0353 06/17/19 0544  NA 135 131* 133* 138  K 3.4* 3.2* 3.6 3.3*  CL 100 97* 103 104  CO2 24 24 22 22   GLUCOSE 194* 143* 200* 261*  BUN 15 10 9 14   CREATININE 0.42* <0.30* 0.32* 0.36*  CALCIUM 7.2* 7.0* 7.2* 7.0*  MG  --  1.8  --   --    GFR: Estimated Creatinine Clearance: 53 mL/min (A) (by C-G formula based on SCr of 0.36 mg/dL (L)). Liver Function Tests: Recent Labs  Lab 06/15/19 1043  AST 30   ALT 32  ALKPHOS 171*  BILITOT 0.8  PROT 5.8*  ALBUMIN 2.6*   No results for input(s): LIPASE, AMYLASE in the last 168 hours. No results for input(s): AMMONIA in the last 168 hours. Coagulation Profile: Recent Labs  Lab 06/16/19 0353  INR 1.2   Cardiac Enzymes: No results for input(s): CKTOTAL, CKMB, CKMBINDEX, TROPONINI in the last 168 hours. BNP (last 3 results) No results for input(s): PROBNP in the last 8760 hours. HbA1C: No results for input(s): HGBA1C in the last 72 hours. CBG: Recent Labs  Lab 06/15/19 2032 06/16/19 0804 06/16/19 1135 06/16/19 1638 06/16/19 2051  GLUCAP 108* 187* 297* 255* 258*   Lipid Profile: No results for input(s): CHOL, HDL, LDLCALC, TRIG, CHOLHDL, LDLDIRECT in the last 72 hours. Thyroid Function Tests: No results for input(s): TSH, T4TOTAL, FREET4, T3FREE, THYROIDAB in the last 72 hours. Anemia Panel: No results for input(s): VITAMINB12, FOLATE, FERRITIN, TIBC, IRON, RETICCTPCT in the last 72 hours. Sepsis Labs: No results for input(s): PROCALCITON, LATICACIDVEN in the last 168 hours.  Recent Results (from the past 240 hour(s))  Urine Culture     Status: None   Collection Time: 06/15/19 10:27 AM   Specimen: Urine, Clean Catch  Result Value Ref Range Status   Specimen Description   Final    URINE, CLEAN CATCH Performed at [facility], [address], [place], [place] [address]    Special Requests   Final    NONE Performed at [facility], [address], [place], [place] [address]    Culture   Final    NO GROWTH Performed at [facility], [address] [place], [place] [address]    Report Status 06/16/2019 FINAL  Final  Culture, blood (routine x 2)     Status: None (Preliminary result)   Collection Time: 06/15/19 10:54 AM   Specimen: BLOOD  Result Value Ref Range Status   Specimen Description   Final    BLOOD LEFT HAND Performed at [facility], [address] [place], [place] [address]     Special Requests   Final    Blood Culture results may not be optimal due to an inadequate volume of blood received in culture bottles Performed at [facility], [address], [place], [place] [address]    Culture   Final    NO GROWTH 2 DAYS Performed at [facility], [address] [place], [place] [address]    Report Status PENDING  Incomplete  Culture, blood (routine x 2)     Status: None (Preliminary result)   Collection Time: 06/15/19 11:05 AM   Specimen: BLOOD  Result Value Ref Range Status   Specimen Description   Final    BLOOD RIGHT HAND Performed at [facility], [address] [place], [place] [address]    Special Requests   Final  Blood Culture adequate volume Performed at [facility], [address] [place], [place] [address]    Culture   Final    NO GROWTH 2 DAYS Performed at [facility], [address] [place], [place] [address]    Report Status PENDING  Incomplete  Respiratory Panel by RT PCR (Flu A&B, Covid) - Nasopharyngeal Swab     Status: None   Collection Time: 06/15/19  6:22 PM   Specimen: Nasopharyngeal Swab  Result Value Ref Range Status   SARS Coronavirus 2 by RT PCR NEGATIVE NEGATIVE Final    Comment: (NOTE) SARS-CoV-2 target nucleic acids are NOT DETECTED. The SARS-CoV-2 RNA is generally detectable in upper respiratoy specimens during the acute phase of infection. The lowest concentration of SARS-CoV-2 viral copies this assay can detect is 131 copies/mL. A negative result does not preclude SARS-Cov-2 infection and should not be used as the sole basis for treatment or other patient management decisions. A negative result may occur with  improper specimen collection/handling, submission of specimen other than nasopharyngeal swab, presence of viral mutation(s) within the areas targeted by this assay, and inadequate number of viral copies (<131 copies/mL). A negative result must be combined with  clinical observations, patient history, and epidemiological information. The expected result is Negative. Fact Sheet for Patients:  PinkCheek.be Fact Sheet for Healthcare Providers:  GravelBags.it This test is not yet ap proved or cleared by the [place] FDA and  has been authorized for detection and/or diagnosis of SARS-CoV-2 by FDA under an Emergency Use Authorization (EUA). This EUA will remain  in effect (meaning this test can be used) for the duration of the COVID-19 declaration under Section 564(b)(1) of the Act, 21 U.S.C. section 360bbb-3(b)(1), unless the authorization is terminated or revoked sooner.    Influenza A by PCR NEGATIVE NEGATIVE Final   Influenza B by PCR NEGATIVE NEGATIVE Final    Comment: (NOTE) The Xpert Xpress SARS-CoV-2/FLU/RSV assay is intended as an aid in  the diagnosis of influenza from Nasopharyngeal swab specimens and  should not be used as a sole basis for treatment. Nasal washings and  aspirates are unacceptable for Xpert Xpress SARS-CoV-2/FLU/RSV  testing. Fact Sheet for Patients: PinkCheek.be Fact Sheet for Healthcare Providers: GravelBags.it This test is not yet approved or cleared by the [place] FDA and  has been authorized for detection and/or diagnosis of SARS-CoV-2 by  FDA under an Emergency Use Authorization (EUA). This EUA will remain  in effect (meaning this test can be used) for the duration of the  Covid-19 declaration under Section 564(b)(1) of the Act, 21  U.S.C. section 360bbb-3(b)(1), unless the authorization is  terminated or revoked. Performed at [facility], [address], [place], [place] [address]          Radiology Studies: CT HEAD WO CONTRAST  Result Date: 06/15/2019 CLINICAL DATA:  Altered mental status. EXAM: CT HEAD WITHOUT CONTRAST TECHNIQUE: Contiguous axial images were  obtained from the base of the skull through the vertex without intravenous contrast. COMPARISON:  May 08, 2019. FINDINGS: Brain: Increased left frontal lobe low density with sulcal effacement is noted concerning for infarction or edema secondary to underlying metastatic lesion. 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 Ventricular size is within normal limits. No definite evidence of hemorrhage is noted. No midline shift is noted. Vascular: No hyperdense vessel or unexpected calcification. Skull: Normal. Negative for fracture or focal lesion. Sinuses/Orbits: Bilateral ethmoid and sphenoid sinusitis. Other: None. IMPRESSION: Increased left frontal lobe low density  with sulcal effacement is noted concerning for infarction or edema secondary to underlying metastatic lesion. MRI with and without gadolinium is recommended for further evaluation. Electronically Signed   By: Marijo Conception M.D.   On: 06/15/2019 16:57   CT HEAD W CONTRAST  Result Date: 06/15/2019 CLINICAL DATA:  Lung cancer with abnormal noncontrast CT EXAM: CT HEAD WITH CONTRAST TECHNIQUE: Contiguous axial images were obtained from the base of the skull through the vertex with intravenous contrast. CONTRAST:  32mL OMNIPAQUE IOHEXOL 300 MG/ML  SOLN COMPARISON:  Earlier same day, contrast CT 05/08/2019 FINDINGS: Brain: Increase in size of left frontal parenchymal lesion now measuring 14 x 8 mm (previously 11 x 8 mm). Surrounding edema has also increased. Additional more apparent but similar size 7 mm right frontal lesion (series 4, image 15). Possible additional subcentimeter right parietal lesions (series 2, image 22 and series 4, image 9); the latter was present on the prior study and is unchanged. Chronic left parietal infarction. No significant mass effect. No hydrocephalus. Vascular: Unremarkable. Skull: No new findings. Sinuses/Orbits: No acute finding. Other: None. IMPRESSION: Increase in size of left frontal metastasis and associated edema.  Additional three subcentimeter lesions as described. Electronically Signed   By: Macy Mis M.D.   On: 06/15/2019 20:27   DG Chest Portable 1 View  Result Date: 06/15/2019 CLINICAL DATA:  Fever, metastatic lung cancer EXAM: PORTABLE CHEST 1 VIEW COMPARISON:  None. FINDINGS: Left perihilar density likely reflecting partially visualized mass. There is mild elevation of the left hemidiaphragm. Mild nonspecific interstitial prominence. Heart size is likely within normal limits for portable technique. Left chest wall ICD is present. Right chest wall port catheter tip overlies cavoatrial junction. IMPRESSION: Left perihilar density likely reflecting partially visualized mass. Mild elevation of the left hemidiaphragm. Mild bibasilar atelectasis and nonspecific interstitial prominence. Electronically Signed   By: Macy Mis M.D.   On: 06/15/2019 18:42        Scheduled Meds: . apixaban  5 mg Oral BID  . atorvastatin  40 mg Oral Daily  . Chlorhexidine Gluconate Cloth  6 each Topical Daily  . dexamethasone (DECADRON) injection  4 mg Intravenous Q6H  . insulin aspart  0-15 Units Subcutaneous TID WC  . levothyroxine  100 mcg Oral Q0600  . lisinopril  10 mg Oral Daily  . mouth rinse  15 mL Mouth Rinse q12n4p  . metoprolol tartrate  50 mg Oral BID  . potassium chloride  40 mEq Oral Once   Continuous Infusions:   LOS: 2 days    Time spent: 33 mins    Wyvonnia Dusky, MD [facility] Pager [phone]  If 7PM-7AM, please contact night-coverage [email or web address] 06/17/2019, 8:22 AM

## 2019-06-18 DIAGNOSIS — G9341 Metabolic encephalopathy: Secondary | ICD-10-CM

## 2019-06-18 LAB — CBC
HCT: 36 % (ref 36.0–46.0)
Hemoglobin: 12.1 g/dL (ref 12.0–15.0)
MCH: 27.5 pg (ref 26.0–34.0)
MCHC: 33.6 g/dL (ref 30.0–36.0)
MCV: 81.8 fL (ref 80.0–100.0)
Platelets: 143 10*3/uL — ABNORMAL LOW (ref 150–400)
RBC: 4.4 MIL/uL (ref 3.87–5.11)
RDW: 16.2 % — ABNORMAL HIGH (ref 11.5–15.5)
WBC: 4.7 10*3/uL (ref 4.0–10.5)
nRBC: 1.3 % — ABNORMAL HIGH (ref 0.0–0.2)

## 2019-06-18 LAB — BASIC METABOLIC PANEL
Anion gap: 6 (ref 5–15)
BUN: 16 mg/dL (ref 8–23)
CO2: 25 mmol/L (ref 22–32)
Calcium: 7 mg/dL — ABNORMAL LOW (ref 8.9–10.3)
Chloride: 106 mmol/L (ref 98–111)
Creatinine, Ser: 0.39 mg/dL — ABNORMAL LOW (ref 0.44–1.00)
GFR calc Af Amer: >60 mL/min (ref >60)
GFR calc non Af Amer: >60 mL/min (ref >60)
Glucose, Bld: 274 mg/dL — ABNORMAL HIGH (ref 70–99)
Potassium: 4 mmol/L (ref 3.5–5.1)
Sodium: 137 mmol/L (ref 135–145)

## 2019-06-18 LAB — GLUCOSE, CAPILLARY
Glucose-Capillary: 228 mg/dL — ABNORMAL HIGH (ref 70–99)
Glucose-Capillary: 298 mg/dL — ABNORMAL HIGH (ref 70–99)
Glucose-Capillary: 223 mg/dL — ABNORMAL HIGH (ref 70–99)
Glucose-Capillary: 226 mg/dL — ABNORMAL HIGH (ref 70–99)

## 2019-06-18 MED ORDER — INSULIN DETEMIR 100 UNIT/ML ~~LOC~~ SOLN
7.0000 [IU] | Freq: Every day | SUBCUTANEOUS | Status: DC
  Administered 2019-06-18 – 2019-06-19 (×2): 7 [IU] via SUBCUTANEOUS
  Filled 2019-06-18 (×3): qty 0.07

## 2019-06-18 NOTE — Care Management Important Message (Signed)
Important Message  Patient Details  Name: Megan Shelton MRN: [id number] Date of Birth: 1940/06/28   Medicare Important Message Given:  N/A - LOS <3 / Initial given by admissions     Juliann Pulse A Dodge Ator 06/18/2019, 10:51 AM

## 2019-06-18 NOTE — Progress Notes (Signed)
Megan Shelton   DOB:02/17/1941   JS#:[id number]    Subjective: Patient is resting in the bed comfortably.  Denies any pain.  No nausea vomiting.  She is still confused; however able to answer questions more appropriately today.  Otherwise no acute events overnight.  Vitals:   06/18/19 0811 06/18/19 1656  BP: 135/64 (!) 124/55  Pulse: 64 (!) 51  Resp: 16 16  Temp: 98.6 F (37 C) 98.6 F (37 C)  SpO2: 95% 97%     Intake/Output Summary (Last 24 hours) at 06/18/2019 1950 Last data filed at 06/18/2019 1300 Gross per 24 hour  Intake 240 ml  Output 475 ml  Net -235 ml    Physical Exam  Constitutional: She is well-developed, well-nourished, and in no distress.  HENT:  Head: Normocephalic and atraumatic.  Mouth/Throat: Oropharynx is clear and moist. No oropharyngeal exudate.  Eyes: Pupils are equal, round, and reactive to light.  Cardiovascular: Normal rate and regular rhythm.  Pulmonary/Chest: Effort normal and breath sounds normal. No respiratory distress. She has no wheezes.  Abdominal: Soft. Bowel sounds are normal. She exhibits no distension and no mass. There is no abdominal tenderness. There is no rebound and no guarding.  Musculoskeletal:        General: No tenderness or edema. Normal range of motion.     Cervical back: Normal range of motion and neck supple.  Neurological: She is alert.  Oriented x1-2.   Skin: Skin is warm.  Psychiatric: Affect normal.     Labs:  Lab Results  Component Value Date   WBC 4.7 06/18/2019   HGB 12.1 06/18/2019   HCT 36.0 06/18/2019   MCV 81.8 06/18/2019   PLT 143 (L) 06/18/2019   NEUTROABS 3.5 06/15/2019    Lab Results  Component Value Date   NA 137 06/18/2019   K 4.0 06/18/2019   CL 106 06/18/2019   CO2 25 06/18/2019    Studies:  No results found.  Brain metastases [facility]) #79 year old female patient metastatic lung cancer to the brain currently admitted to hospital for acute onset of altered mental status  # Altered mental  status-likely secondary to worsening edema status post radiation; rather than progressive disease in the brain.  Slight improvement noted.  Continue steroids 4 mg q. 6 hours; will start a taper to 4 mg every 8 hours starting tomorrow.  # Metastatic lung cancer-adenocarcinoma status post carbo-Alimta Keytruda status post cycle #1.  Once acute issues resolve I think reasonable to start osimertinib given the brain penetration.  #Thrombocytopenia-secondary chemotherapy resolved.  Okay to continue Eliquis [A. Fib].  However risk of falls should be considered while on anticoagulation.  #Elevated blood glucose-secondary steroids currently on sliding scale insulin.  #Urinary retention-improved.  #DNR/DNI; I had a long discussion with patient son Gerald Stabs regarding overall clinical picture/expected slow improvement in her mentation over time.  Son also corroborates that patient mentation is slightly improved since admission although not close to baseline yet.  However understands that she is at risk of falls-especially on anticoagulation/decline in the condition if she develops any unfortunate unexpected complications over the next many weeks.  Also discussed with Dr. Jimmye Norman ~that the median survival of EGFR positive lung cancer is up to 3 years.  However this has to be weighed in the context of patient's age performance status and brain metastases.  # 40 minutes face-to-face with the patient/pt's family discussing the above plan of care; more than 50% of time spent on prognosis/ natural history; counseling and coordination.  Cammie Sickle, MD 06/18/2019  7:50 PM

## 2019-06-18 NOTE — Progress Notes (Addendum)
Inpatient Diabetes Program Recommendations  AACE/ADA: New Consensus Statement on Inpatient Glycemic Control (2015)  Target Ranges:  Prepandial:   less than 140 mg/dL      Peak postprandial:   less than 180 mg/dL (1-2 hours)      Critically ill patients:  140 - 180 mg/dL   Lab Results  Component Value Date   GLUCAP 228 (H) 06/18/2019   HGBA1C 7.1 (H) 05/11/2019    Review of Glycemic Control Results for Megan Shelton, Megan Shelton (MRN [id number]) as of 06/18/2019 10:41  Ref. Range 06/17/2019 08:24 06/17/2019 12:05 06/17/2019 16:47 06/17/2019 22:20 06/18/2019 08:10  Glucose-Capillary Latest Ref Range: 70 - 99 mg/dL 234 (H) 301 (H) 177 (H) 261 (H) 228 (H)   Inpatient Diabetes Program Recommendations:   While in the hospital on steroids: -Levemir 7 units qd Received verbal order from Dr. Jimmye Norman for order.  Thank you, Nani Gasser. Anessa Charley, RN, MSN, CDE  Diabetes Coordinator Inpatient Glycemic Control Team Team Pager [phone] (8am-5pm) 06/18/2019 10:44 AM

## 2019-06-18 NOTE — Progress Notes (Signed)
Physical Therapy Treatment Patient Details Name: Megan Shelton MRN: [id number] DOB: 15-Nov-1940 Today's Date: 06/18/2019    History of Present Illness 79 year old female with DM2, HFrEF status post ICD, CAD, A. Fib and stage IV metastatic lung CA with mets to bone and brain was admitted 06/15/2019 with altered mental status and weakness. Head CT showed increased in size of left frontal mets and associated edema.      PT Comments    Pt continues to show poor awareness (eg. When asked today's date she managed the year, but when asked the month states, abruptly, "Tuesday", asked MONTH again and "Tuesday" then names the 12 months and asked "Tuesday." Attempted to trial ambulation with [facility] but pt struggled to maintain balance and felt unsteady.  Pt and PT agree that walker is the better bet.  Pt however even had some issues with manipulation of walker hitting a non-moving obstacle and, on multiple occasions, needing heavy cuing/direct intervention for directional/safety issues.  Pt pleasant, but clearly confused and showed some awareness that she was struggling to make her thoughts/needs known.  Follow Up Recommendations  Home health PT;Supervision/Assistance - 24 hour     Equipment Recommendations  Rolling walker with 5" wheels(youth height)    Recommendations for Other Services       Precautions / Restrictions Precautions Precautions: Fall Restrictions Weight Bearing Restrictions: No    Mobility  Bed Mobility Overal bed mobility: Modified Independent             General bed mobility comments: With heavy use of rail pt was able to get to EOB w/o assist  Transfers Overall transfer level: Modified independent Equipment used: Rolling walker (2 wheeled)             General transfer comment: Pt needed extra VCs to use UEs/walker appropriate for transition to standing, but did rise w/o assist.  Ambulation/Gait Ambulation/Gait assistance: Supervision Gait Distance (Feet): 250  Feet Assistive device: Rolling walker (2 wheeled)       General Gait Details: Attempted to start bout of ambulation with SPC, pt felt unsteady and quickly made it clear that she did not feel safe (nor did she look safe, PT encouraged walker use).  Pt did much better with more supportive AD but did actually run into a stationary object X 1 and showed poor awareness needing cues to insure avoidance of some other obstacles.     Stairs Stairs: Yes Stairs assistance: Min guard Stair Management: Two rails Number of Stairs: 4 General stair comments: Pt able to negotiate up/down steps but ultimately showed little awareness and needed consistent light cuing/encouragement to complete   Wheelchair Mobility    Modified Rankin (Stroke Patients Only)       Balance Overall balance assessment: Needs assistance Sitting-balance support: No upper extremity supported Sitting balance-Leahy Scale: Good     Standing balance support: Bilateral upper extremity supported Standing balance-Leahy Scale: Poor Standing balance comment: Pt struggled with moderate balance activities and did need PT's HHA (sometimes very heavily) to maintain standing balance                            Cognition Arousal/Alertness: Awake/alert Behavior During Therapy: Flat affect Overall Cognitive Status: Impaired/Different from baseline                                 General Comments: Pt continues to be confused and show  inability to process even some very simple cues.      Exercises Other Exercises Other Exercises: standing balance exercises with b/l UE support (moderate to heavy) heel raises, static standing with NBOS, SLS (pt sitting down abruptly with multiple attempts on each, more so with R SLS).  Pt able to participate but did not tolerate much (re: time or challenge)    General Comments General comments (skin integrity, edema, etc.): HR and O2 more consistent and appropriate this date  (HR generally 90-100, O2 down to low 90s)      Pertinent Vitals/Pain Pain Assessment: No/denies pain    Home Living                      Prior Function            PT Goals (current goals can now be found in the care plan section) Progress towards PT goals: Progressing toward goals    Frequency    Min 2X/week      PT Plan Current plan remains appropriate    Co-evaluation              AM-PAC PT "6 Clicks" Mobility   Outcome Measure  Help needed turning from your back to your side while in a flat bed without using bedrails?: None Help needed moving from lying on your back to sitting on the side of a flat bed without using bedrails?: None Help needed moving to and from a bed to a chair (including a wheelchair)?: None Help needed standing up from a chair using your arms (e.g., wheelchair or bedside chair)?: None Help needed to walk in hospital room?: A Little Help needed climbing 3-5 steps with a railing? : A Little 6 Click Score: 22    End of Session Equipment Utilized During Treatment: Gait belt Activity Tolerance: Patient tolerated treatment well Patient left: with chair alarm set;with call bell/phone within reach Nurse Communication: Mobility status PT Visit Diagnosis: Muscle weakness (generalized) (M62.81);Difficulty in walking, not elsewhere classified (R26.2);Other symptoms and signs involving the nervous system (R29.898)     Time: 1410-1435 PT Time Calculation (min) (ACUTE ONLY): 25 min  Charges:  $Gait Training: 8-22 mins $Therapeutic Exercise: 8-22 mins                     Kreg Shropshire, DPT 06/18/2019, 2:57 PM

## 2019-06-18 NOTE — Plan of Care (Signed)

## 2019-06-18 NOTE — Progress Notes (Signed)
PROGRESS NOTE    Megan Shelton  VEH:[id number] DOB: 1940/12/15 DOA: 06/15/2019 PCP: Mar Daring, PA-C       Assessment & Plan:   Principal Problem:   Acute metabolic encephalopathy Active Problems:   Diabetes mellitus without complication ([place])   Essential hypertension   H/O thyroidectomy   Coronary artery disease involving native coronary artery of native heart without angina pectoris   Chronic HFrEF (heart failure with reduced ejection fraction) ([facility])   Ischemic cardiomyopathy   Cardiac defibrillator in place   Bone metastases ([place])   Stage 4 malignant neoplasm of lung ([place])   Brain metastases ([place])   Urinary retention   Altered mental status   History of cardioversion   History of atrial fibrillation   Generalized weakness   Acute focal neurological deficit   Foley catheter in place on admission   Chemotherapy-induced thrombocytopenia  Altered mental status: secondary to increasing size of brain metastasis with edema from 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 Continue on decadron. Dr. Janese Banks, patient's oncologist contacted neurosurgery and radiation oncology who had no further recommendations. .  Acute urinary retention: resolved  HTN: continue on metoprolol, lisinopril. Continue to hold lasix   Hypokalemia: WNL today. Will continue to monitor   DM2: continue on SSI w/ accuchecks  CAD: continue atorvastatin and metoprolol. No antiplatelets  Atrial fibrillation: likely PAF. Continue on metoprolol & eliquis   Chronic systolic CHF: euvolemic, continue home doses of metoprolol. Continue to hold lasix   Hypothyroidism: continue on synthroid  Generalized weakness: PT recommends home health.   Thrombocytopenia: likely secondary to chemo. Will continue to monitor   DVT prophylaxis: eliquis  Code Status: DNR Family Communication: discussed pt's care w/ pt's daughter again today, Megan Shelton & answered her questions Disposition Plan:  still confused but will likely d/c in 24 hours home w/ home health    Consultants:      Procedures:    Antimicrobials:    Subjective: Pt c/o malaise  Objective: Vitals:   06/16/19 2341 06/17/19 0824 06/17/19 1646 06/18/19 0012  BP: 135/72 (!) 147/67 (!) 123/53 131/76  Pulse: 71 66 67 (!) 52  Resp: 17  16 16   Temp: 98.6 F (37 C) 97.7 F (36.5 C) 98 F (36.7 C) (!) 97.3 F (36.3 C)  TempSrc: Oral Oral  Oral  SpO2: 94% 94% 95% 95%  Weight:      Height:        Intake/Output Summary (Last 24 hours) at 06/18/2019 0803 Last data filed at 06/18/2019 0113 Gross per 24 hour  Intake 290 ml  Output 476 ml  Net -186 ml   Filed Weights   06/15/19 1620  Weight: 68 kg    Examination:  General exam: Appears calm and comfortable  Respiratory system: decreased breath sounds b/l. No rales Cardiovascular system: S1 & S2 +. No rubs, gallops or clicks.  Gastrointestinal system: Abdomen is nondistended, soft and nontender. Hypoactive bowel sounds heard. Central nervous system: Alert and awake. Moves all 4 extremities  Psychiatry: Judgement and insight appear abnormal. Flat mood and affect     Data Reviewed: I have personally reviewed following labs and imaging studies  CBC: Recent Labs  Lab 06/11/19 1253 06/15/19 1043 06/16/19 0353 06/17/19 0544 06/18/19 0325  WBC 2.5* 4.0 3.0* 4.7 4.7  NEUTROABS 2.1 3.5  --   --   --   HGB 14.4 13.1 12.2 12.0 12.1  HCT 43.8 40.1 36.6 35.5* 36.0  MCV 83.4 82.7 82.2  81.1 81.8  PLT 32* 53* 62* 107* 353*   Basic Metabolic Panel: Recent Labs  Lab 06/11/19 1253 06/15/19 1043 06/16/19 0353 06/17/19 0544 06/18/19 0325  NA 135 131* 133* 138 137  K 3.4* 3.2* 3.6 3.3* 4.0  CL 100 97* 103 104 106  CO2 24 24 22 22 25   GLUCOSE 194* 143* 200* 261* 274*  BUN 15 10 9 14 16   CREATININE 0.42* <0.30* 0.32* 0.36* 0.39*  CALCIUM 7.2* 7.0* 7.2* 7.0* 7.0*  MG  --  1.8  --   --   --    GFR: Estimated Creatinine Clearance: 53 mL/min (A) (by  C-G formula based on SCr of 0.39 mg/dL (L)). Liver Function Tests: Recent Labs  Lab 06/15/19 1043  AST 30  ALT 32  ALKPHOS 171*  BILITOT 0.8  PROT 5.8*  ALBUMIN 2.6*   No results for input(s): LIPASE, AMYLASE in the last 168 hours. No results for input(s): AMMONIA in the last 168 hours. Coagulation Profile: Recent Labs  Lab 06/16/19 0353  INR 1.2   Cardiac Enzymes: No results for input(s): CKTOTAL, CKMB, CKMBINDEX, TROPONINI in the last 168 hours. BNP (last 3 results) No results for input(s): PROBNP in the last 8760 hours. HbA1C: No results for input(s): HGBA1C in the last 72 hours. CBG: Recent Labs  Lab 06/16/19 2051 06/17/19 0824 06/17/19 1205 06/17/19 1647 06/17/19 2220  GLUCAP 258* 234* 301* 177* 261*   Lipid Profile: No results for input(s): CHOL, HDL, LDLCALC, TRIG, CHOLHDL, LDLDIRECT in the last 72 hours. Thyroid Function Tests: No results for input(s): TSH, T4TOTAL, FREET4, T3FREE, THYROIDAB in the last 72 hours. Anemia Panel: No results for input(s): VITAMINB12, FOLATE, FERRITIN, TIBC, IRON, RETICCTPCT in the last 72 hours. Sepsis Labs: No results for input(s): PROCALCITON, LATICACIDVEN in the last 168 hours.  Recent Results (from the past 240 hour(s))  Urine Culture     Status: None   Collection Time: 06/15/19 10:27 AM   Specimen: Urine, Clean Catch  Result Value Ref Range Status   Specimen Description   Final    URINE, CLEAN CATCH Performed at [facility], [address] [place], [place] [address]    Special Requests   Final    NONE Performed at [facility], [address] [place], [place] [address]    Culture   Final    NO GROWTH Performed at [facility], [place] [address], [place], [place] [address]    Report Status 06/16/2019 FINAL  Final  Culture, blood (routine x 2)     Status: None (Preliminary result)   Collection Time: 06/15/19 10:54 AM   Specimen: BLOOD  Result Value Ref Range Status   Specimen Description    Final    BLOOD LEFT HAND Performed at [facility], [address] [place], [place] [address]    Special Requests   Final    Blood Culture results may not be optimal due to an inadequate volume of blood received in culture bottles Performed at [facility], [address], [place], [place] [address]    Culture   Final    NO GROWTH 3 DAYS Performed at [facility], [address] [place],  [address]    Report Status PENDING  Incomplete  Culture, blood (routine x 2)     Status: None (Preliminary result)   Collection Time: 06/15/19 11:05 AM   Specimen: BLOOD  Result Value Ref Range Status   Specimen Description   Final  BLOOD RIGHT HAND Performed at [facility], [address], [place], [place] [address]    Special Requests   Final    Blood Culture adequate volume Performed at [facility], [address] [place], [place] [address]    Culture   Final    NO GROWTH 3 DAYS Performed at [facility], [address] [place], [place] [address]    Report Status PENDING  Incomplete  Respiratory Panel by RT PCR (Flu A&B, Covid) - Nasopharyngeal Swab     Status: None   Collection Time: 06/15/19  6:22 PM   Specimen: Nasopharyngeal Swab  Result Value Ref Range Status   SARS Coronavirus 2 by RT PCR NEGATIVE NEGATIVE Final    Comment: (NOTE) SARS-CoV-2 target nucleic acids are NOT DETECTED. The SARS-CoV-2 RNA is generally detectable in upper respiratoy specimens during the acute phase of infection. The lowest concentration of SARS-CoV-2 viral copies this assay can detect is 131 copies/mL. A negative result does not preclude SARS-Cov-2 infection and should not be used as the sole basis for treatment or other patient management decisions. A negative result may occur with  improper specimen collection/handling, submission of specimen other than nasopharyngeal swab, presence of viral mutation(s) within the areas targeted by this  assay, and inadequate number of viral copies (<131 copies/mL). A negative result must be combined with clinical observations, patient history, and epidemiological information. The expected result is Negative. Fact Sheet for Patients:  PinkCheek.be Fact Sheet for Healthcare Providers:  GravelBags.it This test is not yet ap proved or cleared by the [place] FDA and  has been authorized for detection and/or diagnosis of SARS-CoV-2 by FDA under an Emergency Use Authorization (EUA). This EUA will remain  in effect (meaning this test can be used) for the duration of the COVID-19 declaration under Section 564(b)(1) of the Act, 21 U.S.C. section 360bbb-3(b)(1), unless the authorization is terminated or revoked sooner.    Influenza A by PCR NEGATIVE NEGATIVE Final   Influenza B by PCR NEGATIVE NEGATIVE Final    Comment: (NOTE) The Xpert Xpress SARS-CoV-2/FLU/RSV assay is intended as an aid in  the diagnosis of influenza from Nasopharyngeal swab specimens and  should not be used as a sole basis for treatment. Nasal washings and  aspirates are unacceptable for Xpert Xpress SARS-CoV-2/FLU/RSV  testing. Fact Sheet for Patients: PinkCheek.be Fact Sheet for Healthcare Providers: GravelBags.it This test is not yet approved or cleared by the [place] FDA and  has been authorized for detection and/or diagnosis of SARS-CoV-2 by  FDA under an Emergency Use Authorization (EUA). This EUA will remain  in effect (meaning this test can be used) for the duration of the  Covid-19 declaration under Section 564(b)(1) of the Act, 21  U.S.C. section 360bbb-3(b)(1), unless the authorization is  terminated or revoked. Performed at [facility], [address] [place], [place] [address]          Radiology Studies: No results found.      Scheduled Meds: . apixaban  5  mg Oral BID  . atorvastatin  40 mg Oral Daily  . Chlorhexidine Gluconate Cloth  6 each Topical Daily  . dexamethasone (DECADRON) injection  4 mg Intravenous Q6H  . insulin aspart  0-15 Units Subcutaneous TID WC  . levothyroxine  100 mcg Oral Q0600  . lisinopril  10 mg Oral Daily  . mouth rinse  15 mL Mouth Rinse q12n4p  . metoprolol tartrate  50 mg Oral BID  . pantoprazole  40 mg Oral Daily   Continuous Infusions:   LOS: 3 days    Time spent: 33 mins    Wyvonnia Dusky, MD [facility] Pager [phone]  If 7PM-7AM, please contact night-coverage [email or web address] 06/18/2019, 8:03 AM

## 2019-06-19 ENCOUNTER — Telehealth: Payer: Self-pay | Admitting: Internal Medicine

## 2019-06-19 LAB — BASIC METABOLIC PANEL
Anion gap: 9 (ref 5–15)
BUN: 14 mg/dL (ref 8–23)
CO2: 24 mmol/L (ref 22–32)
Calcium: 6.6 mg/dL — ABNORMAL LOW (ref 8.9–10.3)
Chloride: 103 mmol/L (ref 98–111)
Creatinine, Ser: 0.53 mg/dL (ref 0.44–1.00)
GFR calc Af Amer: >60 mL/min (ref >60)
GFR calc non Af Amer: >60 mL/min (ref >60)
Glucose, Bld: 226 mg/dL — ABNORMAL HIGH (ref 70–99)
Potassium: 3.8 mmol/L (ref 3.5–5.1)
Sodium: 136 mmol/L (ref 135–145)

## 2019-06-19 LAB — CBC
HCT: 36.8 % (ref 36.0–46.0)
Hemoglobin: 12.3 g/dL (ref 12.0–15.0)
MCH: 27.5 pg (ref 26.0–34.0)
MCHC: 33.4 g/dL (ref 30.0–36.0)
MCV: 82.3 fL (ref 80.0–100.0)
Platelets: 187 10*3/uL (ref 150–400)
RBC: 4.47 MIL/uL (ref 3.87–5.11)
RDW: 16.3 % — ABNORMAL HIGH (ref 11.5–15.5)
WBC: 5.1 10*3/uL (ref 4.0–10.5)
nRBC: 3 % — ABNORMAL HIGH (ref 0.0–0.2)

## 2019-06-19 LAB — GLUCOSE, CAPILLARY
Glucose-Capillary: 215 mg/dL — ABNORMAL HIGH (ref 70–99)
Glucose-Capillary: 254 mg/dL — ABNORMAL HIGH (ref 70–99)

## 2019-06-19 MED ORDER — DEXAMETHASONE 4 MG PO TABS
4.0000 mg | ORAL_TABLET | Freq: Three times a day (TID) | ORAL | Status: DC
  Administered 2019-06-19: 10:00:00 4 mg via ORAL
  Filled 2019-06-19 (×3): qty 1

## 2019-06-19 MED ORDER — DEXAMETHASONE 4 MG PO TABS: 4.0000 mg | ORAL_TABLET | Freq: Three times a day (TID) | ORAL | 0 refills | Status: AC

## 2019-06-19 MED ORDER — PANTOPRAZOLE SODIUM 40 MG PO TBEC: 40.0000 mg | DELAYED_RELEASE_TABLET | Freq: Every day | ORAL | 0 refills | Status: AC

## 2019-06-19 NOTE — Telephone Encounter (Signed)
Dr.Rao pt- needs follow up post hospital discharge.   Please have pt follow up with Dr.Rao/ [facility] on 3/15 or 3/16- labs- cbc/cmp.   GB

## 2019-06-19 NOTE — Progress Notes (Signed)
Keali Mccraw   DOB:[birth year]   YO#:[id number]    Subjective: No acute events overnight.  Patient still mildly confused.  Denies any pain.  Vitals:   06/19/19 0729 06/19/19 0823  BP: (!) 146/64 139/64  Pulse: (!) 51 63  Resp:    Temp: 98.7 F (37.1 C)   SpO2: 97%     No intake or output data in the 24 hours ending 06/19/19 2214  Physical Exam  Constitutional: She is well-developed, well-nourished, and in no distress.  HENT:  Head: Normocephalic and atraumatic.  Mouth/Throat: Oropharynx is clear and moist. No oropharyngeal exudate.  Eyes: Pupils are equal, round, and reactive to light.  Cardiovascular: Normal rate and regular rhythm.  Pulmonary/Chest: Effort normal and breath sounds normal. No respiratory distress. She has no wheezes.  Abdominal: Soft. Bowel sounds are normal. She exhibits no distension and no mass. There is no abdominal tenderness. There is no rebound and no guarding.  Musculoskeletal:        General: No tenderness or edema. Normal range of motion.     Cervical back: Normal range of motion and neck supple.  Neurological: She is alert.  Oriented x1-2.   Skin: Skin is warm.  Psychiatric: Affect normal.     Labs:  Lab Results  Component Value Date   WBC 5.1 06/19/2019   HGB 12.3 06/19/2019   HCT 36.8 06/19/2019   MCV 82.3 06/19/2019   PLT 187 06/19/2019   NEUTROABS 3.5 06/15/2019    Lab Results  Component Value Date   NA 136 06/19/2019   K 3.8 06/19/2019   CL 103 06/19/2019   CO2 24 06/19/2019    Studies:  No results found.  Brain metastases [facility]) #79 year old female patient metastatic lung cancer to the brain currently admitted to hospital for acute onset of altered mental status  # Altered mental status-likely secondary to worsening edema status post radiation; rather than progressive disease in the brain.  Slight improvement in mentation noted.  Decrease steroids to 4 mg 3 times daily oral; will plan to taper outpatient based on clinical  response.  # Metastatic lung cancer-adenocarcinoma status post carbo-Alimta Keytruda status post cycle #1.  Hold chemotherapy; plan osimertinib once acute issues resolved.  #A. Fib-on Eliquis; need to monitor closely for falls since on anticoagulation.    #Elevated blood glucose-secondary steroids-recommend monitoring closely at home on discharge.  Discussed with primary service.  #Spoke to patient's son, Gerald Stabs who had multiple questions answered in regards to discharge medications/timing/need monitor blood sugars closely.  Also spoke to patient's daughter Mardene Celeste.  Will make arrangements for the patient to follow-up with Dr. Dorthula Rue early next week.   Cammie Sickle, MD 06/19/2019  10:14 PM

## 2019-06-19 NOTE — Progress Notes (Signed)
Received Md order to discharge patient home with home health, reviewed discharge  instructions , home meds, prescriptions, and follow up appointments with Loleta Rose patients daughter and she verbalized understanding .

## 2019-06-19 NOTE — TOC Transition Note (Signed)
Transition of Care [facility]) - CM/SW Discharge Note   Patient Details  Name: Megan Shelton MRN: [id number] Date of Birth: Dec 26, 1940  Transition [facility]) CM/SW Contact:  Su Hilt, RN Phone Number: 06/19/2019, 1:56 PM   Clinical Narrative:    Patient to go home today with [facility] for [facility] services, I let the patient know that [facility] will call her to set up when they are coming, She has a RW in the room that will go home with her provided by [employer] Her daughter will transport her  No additional needs         Patient Goals and CMS Choice        Discharge Placement                       Discharge Plan and Services                                     Social Determinants of Health (SDOH) Interventions     Readmission Risk Interventions No flowsheet data found.

## 2019-06-19 NOTE — Discharge Summary (Signed)
Physician Discharge Summary  [place] Sorn ZDG:[id number] DOB: Oct 23, 1940 DOA: 06/15/2019  PCP: Mar Daring, PA-C  Admit date: 06/15/2019 Discharge date: 06/19/2019  Admitted From: home Disposition: home  Recommendations for Outpatient Follow-up:  1. Follow up with PCP in 1-2 weeks 2. F/u oncology (Dr. Janese Banks) in 3-4 days  Home Health: yes Equipment/Devices:  Discharge Condition: stable CODE STATUS: DNR Diet recommendation: carb modified  Brief/Interim Summary: HPI was taken from Dr. Damita Dunnings: Megan Shelton is a 79 y.o. 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 fib with RVR from 05/11/2019 to 05/14/2019, treated with cardioversion, recently seen by oncologist on 07/01/2019 with discontinuation of Eliquis secondary to thrombocytopenia below 50,000  related to chemotherapy as well as discontinuation of Decadron who presents to the emergency room with altered mental status.  Most of the history is taken from her son Gerald Stabs over the phone, who states that at baseline patient is very talkative very interactive and independent, but to the day after discontinuation of Eliquis and Decadron was noted to be confused and having generalized weakness to where they had to help her off the stool, and unsteadiness on her feet to where she needed someone's assistance to ambulate.  She also had some nausea and decreased appetite but denied any pain.  She had no cough or shortness of breath.  Son also noted that she started having a low-grade fever in the past 2 days of about 100.6.  Patient was seen by her oncologist earlier in the day and had blood work done which showed an improved platelet count of 53,000.  He was sent to the ER for work-up of her altered  mentation.  ED Course: On arrival in the emergency room, patient was afebrile with mild tachycardia of 105, BP 145/99 O2 sat 98% on room air.  She had some minor electrolyte abnormalities, mild hyponatremia 131, mild hypokalemia 3.2 blood work was essentially unremarkable except for platelet count of 53,000, WBC 4, hemoglobin 13.1.  Her plain head CT showed possible infarction or edema secondary to metastatic lesions with recommendation for MRI with and without gadolinium.  She could not get MRI because of her AICD the ER provider initially spoke with covering oncologist Dr. Rogue Bussing, and we also had a three-way discussion on secure text with her primary oncologist, Dr. Janese Banks with decision to get CT head with and without contrast, start IV Decadron 10 mg IV x1 then continue every 6.  And to reevaluate in the a.m.  Hospital Course from Dr. Lenise Herald 3/10-3/12/21: Pt presented w/ acute metabolic encephalopathy likely secondary to increasing size of brain mets w/ edema from stage IV adenocarcinoma of the lung w/ mets to brain & bone. Pt was started on IV decadron. Oncology did see the pt while inpatient. The pt remained confused despite decadron and was d/c this way as well but pt was stable. The pt's daughter, Mardene Celeste, as well as oncology are aware of this. Oncology is hopeful that in more time w/ decadron that the pt's confusion/encephalopathy will improve.   Discharge Diagnoses:  Principal Problem:   Acute metabolic encephalopathy Active Problems:   Diabetes mellitus without complication ([place])   Essential hypertension   H/O thyroidectomy   Coronary artery disease involving native coronary artery of native heart without angina pectoris   Chronic HFrEF (heart failure with  reduced ejection fraction) (H[facility])   Ischemic cardiomyopathy   Cardiac defibrillator in place   Bone metastases [facility])   Stage 4 malignant neoplasm of lung ([place])   Brain metastases ([place])   Urinary retention   Altered mental  status   History of cardioversion   History of atrial fibrillation   Generalized weakness   Acute focal neurological deficit   Foley catheter in place on admission   Chemotherapy-induced thrombocytopenia    Discharge Instructions  Discharge Instructions    Diet - low sodium heart healthy   Complete by: As directed    Diet Carb Modified   Complete by: As directed    Discharge instructions   Complete by: As directed    F/u PCP in 1-2 weeks; f/u oncology (Dr. Janese Banks) in 3-4 days   Increase activity slowly   Complete by: As directed      Allergies as of 06/19/2019      Reactions   Latex Rash   Nickel Rash      Medication List    TAKE these medications   albuterol 108 (90 Base) MCG/ACT inhaler Commonly known as: VENTOLIN HFA Inhale 1 puff into the lungs every 6 (six) hours as needed for wheezing or shortness of breath.   apixaban 5 MG Tabs tablet Commonly known as: ELIQUIS Take 1 tablet (5 mg total) by mouth 2 (two) times daily.   atorvastatin 40 MG tablet Commonly known as: LIPITOR TAKE 1 TABLET BY MOUTH  DAILY   clonazePAM 1 MG tablet Commonly known as: KLONOPIN TAKE 1 TABLET BY MOUTH THREE TIMES A DAY AS NEEDED FOR ANXIETY   dexamethasone 4 MG tablet Commonly known as: DECADRON Take 1 tablet (4 mg total) by mouth 3 (three) times daily for 10 days.   folic acid 1 MG tablet Commonly known as: FOLVITE Take 1 tablet (1 mg total) by mouth daily. Start 5-7 days before Alimta chemotherapy. Continue until 21 days after Alimta completed.   furosemide 40 MG tablet Commonly known as: LASIX Take 1 tablet (40 mg total) by mouth daily as needed for fluid.   glimepiride 2 MG tablet Commonly known as: AMARYL Take 1 tablet (2 mg total) by mouth 2 (two) times daily.   levothyroxine 100 MCG tablet Commonly known as: SYNTHROID Take 1 tablet (100 mcg total) by mouth daily at 6 (six) AM.   lisinopril 10 MG tablet Commonly known as: ZESTRIL TAKE 1 TABLET BY MOUTH  DAILY    magic mouthwash w/lidocaine Soln Take 5 mLs by mouth 4 (four) times daily.   metFORMIN 500 MG 24 hr tablet Commonly known as: GLUCOPHAGE-XR Take 1 tablet (500 mg total) by mouth 2 (two) times daily.   metoprolol tartrate 50 MG tablet Commonly known as: LOPRESSOR Take 1 tablet (50 mg total) by mouth 2 (two) times daily.   oxyCODONE 5 MG immediate release tablet Commonly known as: Oxy IR/ROXICODONE Take 1 tablet (5 mg total) by mouth every 4 (four) hours as needed for severe pain.   pantoprazole 40 MG tablet Commonly known as: PROTONIX Take 1 tablet (40 mg total) by mouth daily. Start taking on: June 20, 2019   sitaGLIPtin 100 MG tablet Commonly known as: JANUVIA Take 100 mg by mouth daily.       Allergies  Allergen Reactions  . Latex Rash  . Nickel Rash    Consultations:  Oncology    Procedures/Studies: CT HEAD WO CONTRAST  Result Date: 06/15/2019 CLINICAL DATA:  Altered mental status. EXAM: CT HEAD  WITHOUT CONTRAST TECHNIQUE: Contiguous axial images were obtained from the base of the skull through the vertex without intravenous contrast. COMPARISON:  May 08, 2019. FINDINGS: Brain: Increased left frontal lobe low density with sulcal effacement is noted concerning for infarction or edema secondary to underlying metastatic lesion. 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 Ventricular size is within normal limits. No definite evidence of hemorrhage is noted. No midline shift is noted. Vascular: No hyperdense vessel or unexpected calcification. Skull: Normal. Negative for fracture or focal lesion. Sinuses/Orbits: Bilateral ethmoid and sphenoid sinusitis. Other: None. IMPRESSION: Increased left frontal lobe low density with sulcal effacement is noted concerning for infarction or edema secondary to underlying metastatic lesion. MRI with and without gadolinium is recommended for further evaluation. Electronically Signed   By: Marijo Conception M.D.   On: 06/15/2019 16:57   CT HEAD  W CONTRAST  Result Date: 06/15/2019 CLINICAL DATA:  Lung cancer with abnormal noncontrast CT EXAM: CT HEAD WITH CONTRAST TECHNIQUE: Contiguous axial images were obtained from the base of the skull through the vertex with intravenous contrast. CONTRAST:  42mL OMNIPAQUE IOHEXOL 300 MG/ML  SOLN COMPARISON:  Earlier same day, contrast CT 05/08/2019 FINDINGS: Brain: Increase in size of left frontal parenchymal lesion now measuring 14 x 8 mm (previously 11 x 8 mm). Surrounding edema has also increased. Additional more apparent but similar size 7 mm right frontal lesion (series 4, image 15). Possible additional subcentimeter right parietal lesions (series 2, image 22 and series 4, image 9); the latter was present on the prior study and is unchanged. Chronic left parietal infarction. No significant mass effect. No hydrocephalus. Vascular: Unremarkable. Skull: No new findings. Sinuses/Orbits: No acute finding. Other: None. IMPRESSION: Increase in size of left frontal metastasis and associated edema. Additional three subcentimeter lesions as described. Electronically Signed   By: Macy Mis M.D.   On: 06/15/2019 20:27   DG Chest Portable 1 View  Result Date: 06/15/2019 CLINICAL DATA:  Fever, metastatic lung cancer EXAM: PORTABLE CHEST 1 VIEW COMPARISON:  None. FINDINGS: Left perihilar density likely reflecting partially visualized mass. There is mild elevation of the left hemidiaphragm. Mild nonspecific interstitial prominence. Heart size is likely within normal limits for portable technique. Left chest wall ICD is present. Right chest wall port catheter tip overlies cavoatrial junction. IMPRESSION: Left perihilar density likely reflecting partially visualized mass. Mild elevation of the left hemidiaphragm. Mild bibasilar atelectasis and nonspecific interstitial prominence. Electronically Signed   By: Macy Mis M.D.   On: 06/15/2019 18:42      Subjective: Pt c/o fatigue   Discharge Exam: Vitals:    06/19/19 0729 06/19/19 0823  BP: (!) 146/64 139/64  Pulse: (!) 51 63  Resp:    Temp: 98.7 F (37.1 C)   SpO2: 97%    Vitals:   06/18/19 2140 06/19/19 0054 06/19/19 0729 06/19/19 0823  BP: 124/60 122/70 (!) 146/64 139/64  Pulse: 63 (!) 55 (!) 51 63  Resp:  18    Temp:  98.8 F (37.1 C) 98.7 F (37.1 C)   TempSrc:  Oral Oral   SpO2:  94% 97%   Weight:      Height:        General: Pt is alert, awake, not in acute distress. Oriented to self & year only  Cardiovascular: S1/S2 +, no rubs, no gallops Respiratory: diminished breath sounds b/l Abdominal: Soft, NT, ND, bowel sounds + Extremities:  no cyanosis    The results of significant diagnostics from this hospitalization (including  imaging, microbiology, ancillary and laboratory) are listed below for reference.     Microbiology: Recent Results (from the past 240 hour(s))  Urine Culture     Status: None   Collection Time: 06/15/19 10:27 AM   Specimen: Urine, Clean Catch  Result Value Ref Range Status   Specimen Description   Final    URINE, CLEAN CATCH Performed at [facility], [address], [place], [place] [address]    Special Requests   Final    NONE Performed at [facility], [address], [place], [place] [address]    Culture   Final    NO GROWTH Performed at [facility], [place] [address], [place], [place] [address]    Report Status 06/16/2019 FINAL  Final  Culture, blood (routine x 2)     Status: None (Preliminary result)   Collection Time: 06/15/19 10:54 AM   Specimen: BLOOD  Result Value Ref Range Status   Specimen Description   Final    BLOOD LEFT HAND Performed at [facility], [address], [place], [place] [address]    Special Requests   Final    Blood Culture results may not be optimal due to an inadequate volume of blood received in culture bottles Performed at [facility], [address] [place], [place] [address]    Culture   Final    NO GROWTH 4  DAYS Performed at [facility], [address], [place], [place] [address]    Report Status PENDING  Incomplete  Culture, blood (routine x 2)     Status: None (Preliminary result)   Collection Time: 06/15/19 11:05 AM   Specimen: BLOOD  Result Value Ref Range Status   Specimen Description   Final    BLOOD RIGHT HAND Performed at [facility], [address], [place], [place] [address]    Special Requests   Final    Blood Culture adequate volume Performed at [facility], [address], [place], [place] [address]    Culture   Final    NO GROWTH 4 DAYS Performed at [facility], [address], [place], [place] [address]    Report Status PENDING  Incomplete  Respiratory Panel by RT PCR (Flu A&B, Covid) - Nasopharyngeal Swab     Status: None   Collection Time: 06/15/19  6:22 PM   Specimen: Nasopharyngeal Swab  Result Value Ref Range Status   SARS Coronavirus 2 by RT PCR NEGATIVE NEGATIVE Final    Comment: (NOTE) SARS-CoV-2 target nucleic acids are NOT DETECTED. The SARS-CoV-2 RNA is generally detectable in upper respiratoy specimens during the acute phase of infection. The lowest concentration of SARS-CoV-2 viral copies this assay can detect is 131 copies/mL. A negative result does not preclude SARS-Cov-2 infection and should not be used as the sole basis for treatment or other patient management decisions. A negative result may occur with  improper specimen collection/handling, submission of specimen other than nasopharyngeal swab, presence of viral mutation(s) within the areas targeted by this assay, and inadequate number of viral copies (<131 copies/mL). A negative result must be combined with clinical observations, patient history, and epidemiological information. The expected result is Negative. Fact Sheet for Patients:  PinkCheek.be Fact Sheet for Healthcare Providers:   GravelBags.it This test is not yet ap proved or cleared by the [place] FDA and  has been authorized for detection and/or diagnosis of SARS-CoV-2 by FDA under an Emergency Use Authorization (EUA). This EUA  will remain  in effect (meaning this test can be used) for the duration of the COVID-19 declaration under Section 564(b)(1) of the Act, 21 U.S.C. section 360bbb-3(b)(1), unless the authorization is terminated or revoked sooner.    Influenza A by PCR NEGATIVE NEGATIVE Final   Influenza B by PCR NEGATIVE NEGATIVE Final    Comment: (NOTE) The Xpert Xpress SARS-CoV-2/FLU/RSV assay is intended as an aid in  the diagnosis of influenza from Nasopharyngeal swab specimens and  should not be used as a sole basis for treatment. Nasal washings and  aspirates are unacceptable for Xpert Xpress SARS-CoV-2/FLU/RSV  testing. Fact Sheet for Patients: PinkCheek.be Fact Sheet for Healthcare Providers: GravelBags.it This test is not yet approved or cleared by the [place] FDA and  has been authorized for detection and/or diagnosis of SARS-CoV-2 by  FDA under an Emergency Use Authorization (EUA). This EUA will remain  in effect (meaning this test can be used) for the duration of the  Covid-19 declaration under Section 564(b)(1) of the Act, 21  U.S.C. section 360bbb-3(b)(1), unless the authorization is  terminated or revoked. Performed at [facility], [address] [place], [place] [address]      Labs: BNP (last 3 results) No results for input(s): BNP in the last 8760 hours. Basic Metabolic Panel: Recent Labs  Lab 06/15/19 1043 06/16/19 0353 06/17/19 0544 06/18/19 0325 06/19/19 0629  NA 131* 133* 138 137 136  K 3.2* 3.6 3.3* 4.0 3.8  CL 97* 103 104 106 103  CO2 24 22 22 25 24   GLUCOSE 143* 200* 261* 274* 226*  BUN 10 9 14 16 14   CREATININE <0.30* 0.32* 0.36* 0.39* 0.53  CALCIUM  7.0* 7.2* 7.0* 7.0* 6.6*  MG 1.8  --   --   --   --    Liver Function Tests: Recent Labs  Lab 06/15/19 1043  AST 30  ALT 32  ALKPHOS 171*  BILITOT 0.8  PROT 5.8*  ALBUMIN 2.6*   No results for input(s): LIPASE, AMYLASE in the last 168 hours. No results for input(s): AMMONIA in the last 168 hours. CBC: Recent Labs  Lab 06/15/19 1043 06/16/19 0353 06/17/19 0544 06/18/19 0325 06/19/19 0629  WBC 4.0 3.0* 4.7 4.7 5.1  NEUTROABS 3.5  --   --   --   --   HGB 13.1 12.2 12.0 12.1 12.3  HCT 40.1 36.6 35.5* 36.0 36.8  MCV 82.7 82.2 81.1 81.8 82.3  PLT 53* 62* 107* 143* 187   Cardiac Enzymes: No results for input(s): CKTOTAL, CKMB, CKMBINDEX, TROPONINI in the last 168 hours. BNP: Invalid input(s): POCBNP CBG: Recent Labs  Lab 06/18/19 1143 06/18/19 1656 06/18/19 2138 06/19/19 0731 06/19/19 1139  GLUCAP 298* 223* 226* 215* 254*   D-Dimer No results for input(s): DDIMER in the last 72 hours. Hgb A1c No results for input(s): HGBA1C in the last 72 hours. Lipid Profile No results for input(s): CHOL, HDL, LDLCALC, TRIG, CHOLHDL, LDLDIRECT in the last 72 hours. Thyroid function studies No results for input(s): TSH, T4TOTAL, T3FREE, THYROIDAB in the last 72 hours.  Invalid input(s): FREET3 Anemia work up No results for input(s): VITAMINB12, FOLATE, FERRITIN, TIBC, IRON, RETICCTPCT in the last 72 hours. Urinalysis    Component Value Date/Time   COLORURINE YELLOW (A) 06/15/2019 1027   APPEARANCEUR CLEAR (A) 06/15/2019 1027   LABSPEC 1.015 06/15/2019 1027   PHURINE 7.0 06/15/2019 1027   GLUCOSEU NEGATIVE 06/15/2019 1027   HGBUR NEGATIVE 06/15/2019 1027   BILIRUBINUR NEGATIVE 06/15/2019  [place] 06/15/2019 [place] 06/15/2019 1027   NITRITE NEGATIVE 06/15/2019 1027   LEUKOCYTESUR NEGATIVE 06/15/2019 1027   Sepsis Labs Invalid input(s): PROCALCITONIN,  WBC,  LACTICIDVEN Microbiology Recent Results (from the past 240 hour(s))  Urine  Culture     Status: None   Collection Time: 06/15/19 10:27 AM   Specimen: Urine, Clean Catch  Result Value Ref Range Status   Specimen Description   Final    URINE, CLEAN CATCH Performed at [facility], [address], [place], [place] [address]    Special Requests   Final    NONE Performed at [facility], [address], [place], [place] [address]    Culture   Final    NO GROWTH Performed at [facility], [place] [address] [place], [place] [address]    Report Status 06/16/2019 FINAL  Final  Culture, blood (routine x 2)     Status: None (Preliminary result)   Collection Time: 06/15/19 10:54 AM   Specimen: BLOOD  Result Value Ref Range Status   Specimen Description   Final    BLOOD LEFT HAND Performed at [facility], [address], [place], [place] [address]    Special Requests   Final    Blood Culture results may not be optimal due to an inadequate volume of blood received in culture bottles Performed at [facility], [address] [place], [place] [address]    Culture   Final    NO GROWTH 4 DAYS Performed at [facility], [address] [place], [place] [address]    Report Status PENDING  Incomplete  Culture, blood (routine x 2)     Status: None (Preliminary result)   Collection Time: 06/15/19 11:05 AM   Specimen: BLOOD  Result Value Ref Range Status   Specimen Description   Final    BLOOD RIGHT HAND Performed at [facility], [address] [place], [place] [address]    Special Requests   Final    Blood Culture adequate volume Performed at [facility], [address] [place], [place] [address]    Culture   Final    NO GROWTH 4 DAYS Performed at [facility], [address] [place], [place] [address]    Report Status PENDING  Incomplete  Respiratory Panel by RT PCR (Flu A&B, Covid) - Nasopharyngeal Swab     Status: None   Collection Time: 06/15/19  6:22 PM   Specimen: Nasopharyngeal Swab   Result Value Ref Range Status   SARS Coronavirus 2 by RT PCR NEGATIVE NEGATIVE Final    Comment: (NOTE) SARS-CoV-2 target nucleic acids are NOT DETECTED. The SARS-CoV-2 RNA is generally detectable in upper respiratoy specimens during the acute phase of infection. The lowest concentration of SARS-CoV-2 viral copies this assay can detect is 131 copies/mL. A negative result does not preclude SARS-Cov-2 infection and should not be used as the sole basis for treatment or other patient management decisions. A negative result may occur with  improper specimen collection/handling, submission of specimen other than nasopharyngeal swab, presence of viral mutation(s) within the areas targeted by this assay, and inadequate number of viral copies (<131 copies/mL). A negative result must be combined with clinical observations, patient history, and epidemiological information. The expected result is Negative. Fact Sheet for Patients:  PinkCheek.be Fact Sheet for Healthcare Providers:  GravelBags.it This test is not yet ap proved or cleared by the [place]  FDA and  has been authorized for detection and/or diagnosis of SARS-CoV-2 by FDA under an Emergency Use Authorization (EUA). This EUA will remain  in effect (meaning this test can be used) for the duration of the COVID-19 declaration under Section 564(b)(1) of the Act, 21 U.S.C. section 360bbb-3(b)(1), unless the authorization is terminated or revoked sooner.    Influenza A by PCR NEGATIVE NEGATIVE Final   Influenza B by PCR NEGATIVE NEGATIVE Final    Comment: (NOTE) The Xpert Xpress SARS-CoV-2/FLU/RSV assay is intended as an aid in  the diagnosis of influenza from Nasopharyngeal swab specimens and  should not be used as a sole basis for treatment. Nasal washings and  aspirates are unacceptable for Xpert Xpress SARS-CoV-2/FLU/RSV  testing. Fact Sheet for  Patients: PinkCheek.be Fact Sheet for Healthcare Providers: GravelBags.it This test is not yet approved or cleared by the [place] FDA and  has been authorized for detection and/or diagnosis of SARS-CoV-2 by  FDA under an Emergency Use Authorization (EUA). This EUA will remain  in effect (meaning this test can be used) for the duration of the  Covid-19 declaration under Section 564(b)(1) of the Act, 21  U.S.C. section 360bbb-3(b)(1), unless the authorization is  terminated or revoked. Performed at [facility], [address] [place], [place] [address]      Time coordinating discharge: Over 30 minutes  SIGNED:   Wyvonnia Dusky, MD  [facility] 06/19/2019, 2:17 PM Pager   If 7PM-7AM, please contact night-coverage [email or web address]

## 2019-06-20 LAB — CULTURE, BLOOD (ROUTINE X 2)
Culture: NO GROWTH
Culture: NO GROWTH
Special Requests: ADEQUATE

## 2019-06-21 ENCOUNTER — Telehealth: Payer: Self-pay | Admitting: Internal Medicine

## 2019-06-21 DIAGNOSIS — C3492 Malignant neoplasm of unspecified part of left bronchus or lung: Secondary | ICD-10-CM | POA: Diagnosis not present

## 2019-06-21 DIAGNOSIS — I252 Old myocardial infarction: Secondary | ICD-10-CM | POA: Diagnosis not present

## 2019-06-21 DIAGNOSIS — D61810 Antineoplastic chemotherapy induced pancytopenia: Secondary | ICD-10-CM | POA: Diagnosis not present

## 2019-06-21 DIAGNOSIS — I110 Hypertensive heart disease with heart failure: Secondary | ICD-10-CM | POA: Diagnosis not present

## 2019-06-21 DIAGNOSIS — I4891 Unspecified atrial fibrillation: Secondary | ICD-10-CM | POA: Diagnosis not present

## 2019-06-21 DIAGNOSIS — C7931 Secondary malignant neoplasm of brain: Secondary | ICD-10-CM | POA: Diagnosis not present

## 2019-06-21 DIAGNOSIS — D6959 Other secondary thrombocytopenia: Secondary | ICD-10-CM | POA: Diagnosis not present

## 2019-06-21 DIAGNOSIS — T451X5S Adverse effect of antineoplastic and immunosuppressive drugs, sequela: Secondary | ICD-10-CM | POA: Diagnosis not present

## 2019-06-21 DIAGNOSIS — C7951 Secondary malignant neoplasm of bone: Secondary | ICD-10-CM | POA: Diagnosis not present

## 2019-06-21 DIAGNOSIS — I5022 Chronic systolic (congestive) heart failure: Secondary | ICD-10-CM | POA: Diagnosis not present

## 2019-06-21 DIAGNOSIS — I2510 Atherosclerotic heart disease of native coronary artery without angina pectoris: Secondary | ICD-10-CM | POA: Diagnosis not present

## 2019-06-21 DIAGNOSIS — G9341 Metabolic encephalopathy: Secondary | ICD-10-CM | POA: Diagnosis not present

## 2019-06-21 DIAGNOSIS — I255 Ischemic cardiomyopathy: Secondary | ICD-10-CM | POA: Diagnosis not present

## 2019-06-21 NOTE — Telephone Encounter (Signed)
Spoke with daughter regarding elevated blood sugars-a.m. 316; p.m. and then 300+  Currently on Metformin 500 milligrams twice daily; Januvia 100 mg; glimepiride 2 mg twice a day; Dex 4 mg 3 times a day  Recommend increasing glimepiride to 4 mg twice a day.   If patient needs ongoing steroids-I think patient might benefit from sliding scale insulin.  Asked daughter to call us back if sugars are still elevated tomorrow.  [place] team- any thoughts?/Needs closer follow-up.  GB

## 2019-06-21 NOTE — Telephone Encounter (Signed)
I see her on Thursday. Can you do a video visit with her before that and see how we can help her with blood sugars and how her mentation is doing?

## 2019-06-22 ENCOUNTER — Telehealth: Payer: Self-pay | Admitting: Pharmacy Technician

## 2019-06-22 ENCOUNTER — Telehealth: Payer: Self-pay

## 2019-06-22 ENCOUNTER — Ambulatory Visit (INDEPENDENT_AMBULATORY_CARE_PROVIDER_SITE_OTHER): Payer: Medicare Other | Admitting: *Deleted

## 2019-06-22 DIAGNOSIS — Z95810 Presence of automatic (implantable) cardiac defibrillator: Secondary | ICD-10-CM | POA: Diagnosis not present

## 2019-06-22 DIAGNOSIS — C3410 Malignant neoplasm of upper lobe, unspecified bronchus or lung: Secondary | ICD-10-CM | POA: Diagnosis not present

## 2019-06-22 LAB — CUP PACEART REMOTE DEVICE CHECK
Battery Remaining Longevity: 82 mo
Battery Voltage: 2.99 V
Brady Statistic RV Percent Paced: 1.39 %
Date Time Interrogation Session: 20210315022724
HighPow Impedance: 144 Ohm
Implantable Lead Implant Date: 20080121
Implantable Lead Location: 753860
Implantable Lead Model: 180
Implantable Lead Serial Number: 107986
Implantable Pulse Generator Implant Date: 20160415
Lead Channel Impedance Value: 494 Ohm
Lead Channel Impedance Value: 494 Ohm
Lead Channel Pacing Threshold Amplitude: 0.875 V
Lead Channel Pacing Threshold Pulse Width: 0.4 ms
Lead Channel Sensing Intrinsic Amplitude: 13.375 mV
Lead Channel Sensing Intrinsic Amplitude: 13.375 mV
Lead Channel Setting Pacing Amplitude: 2 V
Lead Channel Setting Pacing Pulse Width: 0.4 ms
Lead Channel Setting Sensing Sensitivity: 0.3 mV

## 2019-06-22 NOTE — Telephone Encounter (Signed)
No HFU scheduled.  

## 2019-06-22 NOTE — Telephone Encounter (Signed)
Yes of course. Megan Shelton- could you get this patient scheduled for a virtual [facility] visit please? Thanks! L

## 2019-06-22 NOTE — Telephone Encounter (Signed)
Oral Oncology Patient Advocate Encounter  Coordinated with patients son to complete application for [employer] Prescription Savings Program in an effort to reduce patient's out of pocket expense for Tagrisso to $0.    Application completed and faxed to [phone] on 06/22/19.  [employer] patient assistance phone number for follow up is [phone].   This encounter will be updated until final determination.   [place] Patient [place] Phone [phone] Fax [phone] 06/23/2019 12:16 PM

## 2019-06-22 NOTE — Progress Notes (Signed)
ICD Remote  

## 2019-06-22 NOTE — Telephone Encounter (Signed)
Transition Care Management Follow-up Telephone Call  Date of discharge and from where: [facility] on 06/19/19  How have you been since you were released from the hospital? Not doing much better. Pt is confused and daughter feels she is more confused today then when she came home. BS has been running high due to Dexamethasone. Today's reading was 249. Dr Rogue Bussing advised pt to increase the Glimepiride to twice a day while on a the steroid. Declined pain, SOB, cough, fever or n/v/d. Appetite is fine.  Any questions or concerns? Yes, concerned about change in independent status. [facility] was ordered for assistance. Also daughter states that when holding pt's left wrist, it feels like the bone is cracked into pieces. Pt declines pain or any other s/s.   Items Reviewed:  Did the pt receive and understand the discharge instructions provided? Yes   Medications obtained and verified? No, declined reviewing at this time. Per daughter meds have already been reviewed with Dr Warren Lacy.   Any new allergies since your discharge? No   Dietary orders reviewed? Yes  Do you have support at home? Yes   Other (ie: DME, Home Health, etc) [facility] ordered for PT, OT and an aide.   Functional Questionnaire: (I = Independent and D = Dependent)  Bathing/Dressing- D, daughter is assisting. Pt has not had a bath since d/c. Daughter is waiting for [facility] aide to assist when visiting tomorrow.   Meal Prep- D  Eating- I  Maintaining continence- D, currently wearing depends at night. Daughter is having to tell pt when to go to the bathroom.  Transferring/Ambulation- D, having assistance from family members and [place] provided pt with a walker.  Managing Meds- D, son manages medications.    Follow up appointments reviewed:    PCP Hospital f/u appt confirmed? Yes  Scheduled to see Fenton Malling on 06/26/19 @ 11:00 AM.  [facility] f/u appt confirmed? N/A  Are transportation arrangements needed? No   If their  condition worsens, is the pt aware to call  their PCP or go to the ED? Yes  Was the patient provided with contact information for the PCP's office or ED? Yes  Was the pt encouraged to call back with questions or concerns? Yes

## 2019-06-23 ENCOUNTER — Other Ambulatory Visit: Payer: Self-pay

## 2019-06-23 ENCOUNTER — Inpatient Hospital Stay: Payer: Medicare Other

## 2019-06-23 ENCOUNTER — Other Ambulatory Visit: Payer: Self-pay | Admitting: *Deleted

## 2019-06-23 ENCOUNTER — Encounter: Payer: Self-pay | Admitting: Oncology

## 2019-06-23 ENCOUNTER — Inpatient Hospital Stay (HOSPITAL_BASED_OUTPATIENT_CLINIC_OR_DEPARTMENT_OTHER): Payer: Medicare Other | Admitting: Oncology

## 2019-06-23 VITALS — BP 100/78 | HR 54 | Temp 97.6°F | Wt 148.0 lb

## 2019-06-23 DIAGNOSIS — C3490 Malignant neoplasm of unspecified part of unspecified bronchus or lung: Secondary | ICD-10-CM

## 2019-06-23 DIAGNOSIS — I4891 Unspecified atrial fibrillation: Secondary | ICD-10-CM | POA: Diagnosis not present

## 2019-06-23 DIAGNOSIS — N189 Chronic kidney disease, unspecified: Secondary | ICD-10-CM | POA: Diagnosis not present

## 2019-06-23 DIAGNOSIS — C7951 Secondary malignant neoplasm of bone: Secondary | ICD-10-CM | POA: Diagnosis not present

## 2019-06-23 DIAGNOSIS — C7931 Secondary malignant neoplasm of brain: Secondary | ICD-10-CM

## 2019-06-23 DIAGNOSIS — I130 Hypertensive heart and chronic kidney disease with heart failure and stage 1 through stage 4 chronic kidney disease, or unspecified chronic kidney disease: Secondary | ICD-10-CM | POA: Diagnosis not present

## 2019-06-23 DIAGNOSIS — E1122 Type 2 diabetes mellitus with diabetic chronic kidney disease: Secondary | ICD-10-CM | POA: Diagnosis not present

## 2019-06-23 DIAGNOSIS — D701 Agranulocytosis secondary to cancer chemotherapy: Secondary | ICD-10-CM | POA: Diagnosis not present

## 2019-06-23 DIAGNOSIS — Z794 Long term (current) use of insulin: Secondary | ICD-10-CM | POA: Diagnosis not present

## 2019-06-23 DIAGNOSIS — Z79899 Other long term (current) drug therapy: Secondary | ICD-10-CM | POA: Diagnosis not present

## 2019-06-23 DIAGNOSIS — T451X5A Adverse effect of antineoplastic and immunosuppressive drugs, initial encounter: Secondary | ICD-10-CM | POA: Diagnosis not present

## 2019-06-23 DIAGNOSIS — G893 Neoplasm related pain (acute) (chronic): Secondary | ICD-10-CM | POA: Diagnosis not present

## 2019-06-23 DIAGNOSIS — Z5111 Encounter for antineoplastic chemotherapy: Secondary | ICD-10-CM | POA: Diagnosis not present

## 2019-06-23 LAB — CBC WITH DIFFERENTIAL/PLATELET
Abs Immature Granulocytes: 1.27 10*3/uL — ABNORMAL HIGH (ref 0.00–0.07)
Basophils Absolute: 0.1 10*3/uL (ref 0.0–0.1)
Basophils Relative: 1 %
Eosinophils Absolute: 0 10*3/uL (ref 0.0–0.5)
Eosinophils Relative: 0 %
HCT: 43.7 % (ref 36.0–46.0)
Hemoglobin: 14.6 g/dL (ref 12.0–15.0)
Immature Granulocytes: 12 %
Lymphocytes Relative: 2 %
Lymphs Abs: 0.2 10*3/uL — ABNORMAL LOW (ref 0.7–4.0)
MCH: 27.2 pg (ref 26.0–34.0)
MCHC: 33.4 g/dL (ref 30.0–36.0)
MCV: 81.5 fL (ref 80.0–100.0)
Monocytes Absolute: 0.9 10*3/uL (ref 0.1–1.0)
Monocytes Relative: 8 %
Neutro Abs: 7.8 10*3/uL — ABNORMAL HIGH (ref 1.7–7.7)
Neutrophils Relative %: 77 %
Platelets: 313 10*3/uL (ref 150–400)
RBC: 5.36 MIL/uL — ABNORMAL HIGH (ref 3.87–5.11)
RDW: 17.4 % — ABNORMAL HIGH (ref 11.5–15.5)
WBC: 10.2 10*3/uL (ref 4.0–10.5)
nRBC: 3.3 % — ABNORMAL HIGH (ref 0.0–0.2)

## 2019-06-23 LAB — COMPREHENSIVE METABOLIC PANEL
ALT: 32 U/L (ref 0–44)
AST: 18 U/L (ref 15–41)
Albumin: 3.4 g/dL — ABNORMAL LOW (ref 3.5–5.0)
Alkaline Phosphatase: 274 U/L — ABNORMAL HIGH (ref 38–126)
Anion gap: 13 (ref 5–15)
BUN: 17 mg/dL (ref 8–23)
CO2: 23 mmol/L (ref 22–32)
Calcium: 7.3 mg/dL — ABNORMAL LOW (ref 8.9–10.3)
Chloride: 95 mmol/L — ABNORMAL LOW (ref 98–111)
Creatinine, Ser: 0.58 mg/dL (ref 0.44–1.00)
GFR calc Af Amer: >60 mL/min (ref >60)
GFR calc non Af Amer: >60 mL/min (ref >60)
Glucose, Bld: 290 mg/dL — ABNORMAL HIGH (ref 70–99)
Potassium: 4.2 mmol/L (ref 3.5–5.1)
Sodium: 131 mmol/L — ABNORMAL LOW (ref 135–145)
Total Bilirubin: 1 mg/dL (ref 0.3–1.2)
Total Protein: 6.6 g/dL (ref 6.5–8.1)

## 2019-06-23 NOTE — Telephone Encounter (Signed)
Spoke to patient's son Gerald Stabs via telephone to set up a virtual visit for today, and son stated that patient was already here in the [place] now for labs and to see Dr. Janese Banks for increased confusion.

## 2019-06-23 NOTE — Progress Notes (Signed)
Patient was at the hospital on 06/15/2019 due to altered mental status. Patient's blood sugar was also elevated when she was at the hospital. Patient's daughter stated that Dr. Rogue Bussing told them that patient should take 2 tablets of Glimepiride in the morning and 2 tablets in the evening.

## 2019-06-24 ENCOUNTER — Telehealth: Payer: Self-pay

## 2019-06-24 ENCOUNTER — Telehealth: Payer: Self-pay | Admitting: Physician Assistant

## 2019-06-24 DIAGNOSIS — I110 Hypertensive heart disease with heart failure: Secondary | ICD-10-CM | POA: Diagnosis not present

## 2019-06-24 DIAGNOSIS — I2510 Atherosclerotic heart disease of native coronary artery without angina pectoris: Secondary | ICD-10-CM | POA: Diagnosis not present

## 2019-06-24 DIAGNOSIS — I5022 Chronic systolic (congestive) heart failure: Secondary | ICD-10-CM | POA: Diagnosis not present

## 2019-06-24 DIAGNOSIS — I255 Ischemic cardiomyopathy: Secondary | ICD-10-CM | POA: Diagnosis not present

## 2019-06-24 DIAGNOSIS — C7951 Secondary malignant neoplasm of bone: Secondary | ICD-10-CM | POA: Diagnosis not present

## 2019-06-24 DIAGNOSIS — C7931 Secondary malignant neoplasm of brain: Secondary | ICD-10-CM | POA: Diagnosis not present

## 2019-06-24 DIAGNOSIS — D61810 Antineoplastic chemotherapy induced pancytopenia: Secondary | ICD-10-CM | POA: Diagnosis not present

## 2019-06-24 DIAGNOSIS — C3492 Malignant neoplasm of unspecified part of left bronchus or lung: Secondary | ICD-10-CM | POA: Diagnosis not present

## 2019-06-24 DIAGNOSIS — G9341 Metabolic encephalopathy: Secondary | ICD-10-CM | POA: Diagnosis not present

## 2019-06-24 DIAGNOSIS — T451X5S Adverse effect of antineoplastic and immunosuppressive drugs, sequela: Secondary | ICD-10-CM | POA: Diagnosis not present

## 2019-06-24 DIAGNOSIS — I252 Old myocardial infarction: Secondary | ICD-10-CM | POA: Diagnosis not present

## 2019-06-24 DIAGNOSIS — I4891 Unspecified atrial fibrillation: Secondary | ICD-10-CM | POA: Diagnosis not present

## 2019-06-24 DIAGNOSIS — D6959 Other secondary thrombocytopenia: Secondary | ICD-10-CM | POA: Diagnosis not present

## 2019-06-24 NOTE — Telephone Encounter (Signed)
Copied from [place] [id number]. Topic: Quick Communication - Home Health Verbal Orders >> Jun 24, 2019  9:23 AM Virl Axe D wrote: Caller/Agency: [facility] Call[facility] Number: [phone]/YYPEJY VM Requesting OT/PT/Skilled Nursing/Social Work/Speech Therapy: Nursing/Home Health Aide Frequency: Nursing 2 week 2 / 1 week 2 / 2 PRN  Home Health Aide 2 week 3

## 2019-06-24 NOTE — Telephone Encounter (Signed)
Copied from [place] [id number]. Topic: Quick Communication - Home Health Verbal Orders >> Jun 24, 2019  4:34 PM Yvette Rack wrote: Caller/Agency: Gerald Stabs with [facility] Call[facility] Number: [phone] Requesting OT/PT/Skilled Nursing/Social Work/Speech Therapy: PT  Frequency: 2 times a week for 3 weeks and 1 time a week for 1 week

## 2019-06-24 NOTE — Telephone Encounter (Signed)
Oral Oncology Patient Advocate Encounter  Received notification from [employer] that patient has been successfully enrolled into their program to receive Tagrisso from the manufacturer at $0 out of pocket until 04/08/2020.    I called and spoke with patients son, Gerald Stabs.  He knows we will have to re-apply.   Patient knows to call the office with questions or concerns.   Oral Oncology Clinic will continue to follow.  [place] [facility] [place] Phone [phone] Fax [phone] 06/24/2019 3:03 PM

## 2019-06-24 NOTE — Telephone Encounter (Signed)
Jeani Hawking advised.   Thanks,   -Mickel Baas

## 2019-06-24 NOTE — Telephone Encounter (Signed)
Yes this is ok 

## 2019-06-25 ENCOUNTER — Other Ambulatory Visit: Payer: Medicare Other

## 2019-06-25 ENCOUNTER — Inpatient Hospital Stay: Payer: Medicare Other | Admitting: Oncology

## 2019-06-25 ENCOUNTER — Ambulatory Visit: Payer: Medicare Other

## 2019-06-25 ENCOUNTER — Ambulatory Visit: Payer: Medicare Other | Admitting: Oncology

## 2019-06-25 ENCOUNTER — Inpatient Hospital Stay: Payer: Medicare Other

## 2019-06-25 ENCOUNTER — Other Ambulatory Visit: Payer: Self-pay | Admitting: Oncology

## 2019-06-25 ENCOUNTER — Encounter: Payer: Self-pay | Admitting: Oncology

## 2019-06-25 ENCOUNTER — Telehealth: Payer: Self-pay

## 2019-06-25 ENCOUNTER — Other Ambulatory Visit: Payer: Self-pay

## 2019-06-25 DIAGNOSIS — I130 Hypertensive heart and chronic kidney disease with heart failure and stage 1 through stage 4 chronic kidney disease, or unspecified chronic kidney disease: Secondary | ICD-10-CM | POA: Diagnosis not present

## 2019-06-25 DIAGNOSIS — C7931 Secondary malignant neoplasm of brain: Secondary | ICD-10-CM

## 2019-06-25 DIAGNOSIS — Z79899 Other long term (current) drug therapy: Secondary | ICD-10-CM | POA: Diagnosis not present

## 2019-06-25 DIAGNOSIS — I4891 Unspecified atrial fibrillation: Secondary | ICD-10-CM | POA: Diagnosis not present

## 2019-06-25 DIAGNOSIS — F411 Generalized anxiety disorder: Secondary | ICD-10-CM

## 2019-06-25 DIAGNOSIS — Z794 Long term (current) use of insulin: Secondary | ICD-10-CM | POA: Diagnosis not present

## 2019-06-25 DIAGNOSIS — D701 Agranulocytosis secondary to cancer chemotherapy: Secondary | ICD-10-CM | POA: Diagnosis not present

## 2019-06-25 DIAGNOSIS — C3490 Malignant neoplasm of unspecified part of unspecified bronchus or lung: Secondary | ICD-10-CM | POA: Diagnosis not present

## 2019-06-25 DIAGNOSIS — T451X5A Adverse effect of antineoplastic and immunosuppressive drugs, initial encounter: Secondary | ICD-10-CM | POA: Diagnosis not present

## 2019-06-25 DIAGNOSIS — N189 Chronic kidney disease, unspecified: Secondary | ICD-10-CM | POA: Diagnosis not present

## 2019-06-25 DIAGNOSIS — Z95828 Presence of other vascular implants and grafts: Secondary | ICD-10-CM

## 2019-06-25 DIAGNOSIS — C7951 Secondary malignant neoplasm of bone: Secondary | ICD-10-CM | POA: Diagnosis not present

## 2019-06-25 DIAGNOSIS — E1122 Type 2 diabetes mellitus with diabetic chronic kidney disease: Secondary | ICD-10-CM | POA: Diagnosis not present

## 2019-06-25 DIAGNOSIS — G893 Neoplasm related pain (acute) (chronic): Secondary | ICD-10-CM | POA: Diagnosis not present

## 2019-06-25 DIAGNOSIS — Z5111 Encounter for antineoplastic chemotherapy: Secondary | ICD-10-CM | POA: Diagnosis not present

## 2019-06-25 LAB — CBC WITH DIFFERENTIAL/PLATELET
Abs Immature Granulocytes: 0.95 10*3/uL — ABNORMAL HIGH (ref 0.00–0.07)
Basophils Absolute: 0.1 10*3/uL (ref 0.0–0.1)
Basophils Relative: 0 %
Eosinophils Absolute: 0 10*3/uL (ref 0.0–0.5)
Eosinophils Relative: 0 %
HCT: 39.9 % (ref 36.0–46.0)
Hemoglobin: 13.5 g/dL (ref 12.0–15.0)
Immature Granulocytes: 8 %
Lymphocytes Relative: 2 %
Lymphs Abs: 0.3 10*3/uL — ABNORMAL LOW (ref 0.7–4.0)
MCH: 27.7 pg (ref 26.0–34.0)
MCHC: 33.8 g/dL (ref 30.0–36.0)
MCV: 81.8 fL (ref 80.0–100.0)
Monocytes Absolute: 0.8 10*3/uL (ref 0.1–1.0)
Monocytes Relative: 6 %
Neutro Abs: 10.1 10*3/uL — ABNORMAL HIGH (ref 1.7–7.7)
Neutrophils Relative %: 84 %
Platelets: 240 10*3/uL (ref 150–400)
RBC: 4.88 MIL/uL (ref 3.87–5.11)
RDW: 17.3 % — ABNORMAL HIGH (ref 11.5–15.5)
WBC: 12.1 10*3/uL — ABNORMAL HIGH (ref 4.0–10.5)
nRBC: 0.9 % — ABNORMAL HIGH (ref 0.0–0.2)

## 2019-06-25 LAB — COMPREHENSIVE METABOLIC PANEL
ALT: 34 U/L (ref 0–44)
AST: 19 U/L (ref 15–41)
Albumin: 3 g/dL — ABNORMAL LOW (ref 3.5–5.0)
Alkaline Phosphatase: 246 U/L — ABNORMAL HIGH (ref 38–126)
Anion gap: 12 (ref 5–15)
BUN: 17 mg/dL (ref 8–23)
CO2: 21 mmol/L — ABNORMAL LOW (ref 22–32)
Calcium: 6.7 mg/dL — ABNORMAL LOW (ref 8.9–10.3)
Chloride: 97 mmol/L — ABNORMAL LOW (ref 98–111)
Creatinine, Ser: 0.53 mg/dL (ref 0.44–1.00)
GFR calc Af Amer: >60 mL/min (ref >60)
GFR calc non Af Amer: >60 mL/min (ref >60)
Glucose, Bld: 374 mg/dL — ABNORMAL HIGH (ref 70–99)
Potassium: 4 mmol/L (ref 3.5–5.1)
Sodium: 130 mmol/L — ABNORMAL LOW (ref 135–145)
Total Bilirubin: 0.9 mg/dL (ref 0.3–1.2)
Total Protein: 5.6 g/dL — ABNORMAL LOW (ref 6.5–8.1)

## 2019-06-25 LAB — PROTEIN, URINE, RANDOM: Total Protein, Urine: 22 mg/dL

## 2019-06-25 MED ORDER — SODIUM CHLORIDE 0.9 % IV SOLN
Freq: Once | INTRAVENOUS | Status: AC
  Filled 2019-06-25: qty 250

## 2019-06-25 MED ORDER — SODIUM CHLORIDE 0.9 % IV SOLN: Freq: Once | INTRAVENOUS | Status: DC

## 2019-06-25 MED ORDER — HEPARIN SOD (PORK) LOCK FLUSH 100 UNIT/ML IV SOLN
500.0000 [IU] | Freq: Once | INTRAVENOUS | Status: AC | PRN
  Administered 2019-06-25: 500 [IU]
  Filled 2019-06-25: qty 5

## 2019-06-25 MED ORDER — CALCIUM GLUCONATE-NACL 1-0.675 GM/50ML-% IV SOLN
1.0000 g | Freq: Once | INTRAVENOUS | Status: AC
  Administered 2019-06-25: 1000 mg via INTRAVENOUS
  Filled 2019-06-25: qty 50

## 2019-06-25 MED ORDER — LORAZEPAM 2 MG/ML IJ SOLN
0.5000 mg | Freq: Once | INTRAMUSCULAR | Status: AC
  Administered 2019-06-25: 12:00:00 0.5 mg via INTRAVENOUS
  Filled 2019-06-25: qty 1

## 2019-06-25 MED ORDER — SODIUM CHLORIDE 0.9 % IV SOLN: 1.0000 g | Freq: Once | INTRAVENOUS | Status: DC

## 2019-06-25 MED ORDER — SODIUM CHLORIDE 0.9% FLUSH
10.0000 mL | Freq: Once | INTRAVENOUS | Status: AC
  Administered 2019-06-25: 10 mL via INTRAVENOUS
  Filled 2019-06-25: qty 10

## 2019-06-25 MED ORDER — SODIUM CHLORIDE 0.9 % IV SOLN
700.0000 mg | Freq: Once | INTRAVENOUS | Status: AC
  Administered 2019-06-25: 13:00:00 700 mg via INTRAVENOUS
  Filled 2019-06-25: qty 12

## 2019-06-25 NOTE — Progress Notes (Signed)
Treatment plan changed to [place] alone.  Do not have enough drug for todays treatment, ok to switch to mvasi for todays dose per MD and insurance.

## 2019-06-25 NOTE — Progress Notes (Signed)
Pt received Calcium, Ativan,  1 Liter NS and Mvasi per MD order (*see MAR*) Pt tolerated infusion well. Pt and VS stable at discharge.

## 2019-06-25 NOTE — Progress Notes (Signed)
Patient: Megan Shelton Female    DOB: 1940/06/06   79 y.o.   MRN: [id number] Visit Date: 06/26/2019  Today's Provider: Mar Daring, PA-C   Chief Complaint  Patient presents with  . Hospitalization Follow-up   Subjective:     HPI   Follow up Hospitalization  Patient was admitted to [facility] on 06/15/2019 and discharged on 06/19/2019. She was treated for  Acute metabolic encephalopathy secondary to metastatic disease. Treatment for this included CT showed possible infarction or edema secondary to metastatic lesions with recommendation for MRI with and without gadolinium.  IV Decadron 10 mg IV x1. Transitioned to PO decadron.  Telephone follow up was done on 06/22/2019 She reports good compliance with treatment. She reports this condition is Improved.  Dr. Janese Banks, her oncologist, told them to they are going to start tapering her down from the Decadron April 1. Has continued to have episodes of hyperglycemia. Needing to discuss options for controlling blood sugar better.  ------------------------------------------------------------------------------------  Wt Readings from Last 3 Encounters:  06/26/19 148 lb (67.1 kg)  06/25/19 147 lb 8 oz (66.9 kg)  06/23/19 148 lb (67.1 kg)      Allergies  Allergen Reactions  . Latex Rash  . Nickel Rash     Current Outpatient Medications:  .  albuterol (VENTOLIN HFA) 108 (90 Base) MCG/ACT inhaler, Inhale 1 puff into the lungs every 6 (six) hours as needed for wheezing or shortness of breath., Disp: 18 g, Rfl: 8 .  apixaban (ELIQUIS) 5 MG TABS tablet, Take 1 tablet (5 mg total) by mouth 2 (two) times daily., Disp: 180 tablet, Rfl: 1 .  atorvastatin (LIPITOR) 40 MG tablet, TAKE 1 TABLET BY MOUTH  DAILY (Patient taking differently: Take 40 mg by mouth daily. ), Disp: 90 tablet, Rfl: 3 .  clonazePAM (KLONOPIN) 1 MG tablet, TAKE 1 TABLET BY MOUTH THREE TIMES A DAY AS NEEDED FOR ANXIETY, Disp: 90 tablet, Rfl: 5 .  dexamethasone  (DECADRON) 4 MG tablet, Take 1 tablet (4 mg total) by mouth 3 (three) times daily for 10 days., Disp: 30 tablet, Rfl: 0 .  glimepiride (AMARYL) 2 MG tablet, Take 1 tablet (2 mg total) by mouth 2 (two) times daily. (Patient taking differently: Take 2 mg by mouth 2 (two) times daily. ), Disp: 180 tablet, Rfl: 1 .  levothyroxine (SYNTHROID) 100 MCG tablet, Take 1 tablet (100 mcg total) by mouth daily at 6 (six) AM., Disp: 90 tablet, Rfl: 1 .  lisinopril (ZESTRIL) 10 MG tablet, TAKE 1 TABLET BY MOUTH  DAILY (Patient taking differently: Take 10 mg by mouth daily. ), Disp: 90 tablet, Rfl: 3 .  metFORMIN (GLUCOPHAGE-XR) 500 MG 24 hr tablet, Take 1 tablet (500 mg total) by mouth 2 (two) times daily., Disp: 180 tablet, Rfl: 1 .  metoprolol tartrate (LOPRESSOR) 50 MG tablet, Take 1 tablet (50 mg total) by mouth 2 (two) times daily., Disp: 180 tablet, Rfl: 1 .  pantoprazole (PROTONIX) 40 MG tablet, Take 1 tablet (40 mg total) by mouth daily., Disp: 30 tablet, Rfl: 0 .  sitaGLIPtin (JANUVIA) 100 MG tablet, Take 100 mg by mouth daily., Disp: , Rfl:  .  furosemide (LASIX) 40 MG tablet, Take 1 tablet (40 mg total) by mouth daily as needed for fluid. (Patient not taking: Reported on 06/26/2019), Disp: 90 tablet, Rfl: 1 .  magic mouthwash w/lidocaine SOLN, Take 5 mLs by mouth 4 (four) times daily., Disp: 240 mL, Rfl: 0 .  oxyCODONE (OXY  IR/ROXICODONE) 5 MG immediate release tablet, Take 1 tablet (5 mg total) by mouth every 4 (four) hours as needed for severe pain. (Patient not taking: Reported on 06/26/2019), Disp: 60 tablet, Rfl: 0  Review of Systems  Constitutional: Positive for fatigue.  HENT: Negative.   Respiratory: Negative.   Cardiovascular: Negative.   Neurological: Positive for weakness.  Psychiatric/Behavioral: Positive for confusion.    Social History   Tobacco Use  . Smoking status: Former Smoker    Packs/day: 1.00    Years: 55.00    Pack years: 55.00    Types: Cigarettes    Quit date: 2006     Years since quitting: 15.2  . Smokeless tobacco: Never Used  . Tobacco comment: quit about 18 years ago; as of 2019  Substance Use Topics  . Alcohol use: No      Objective:   BP (!) 102/55 (BP Location: Left Arm, Patient Position: Sitting, Cuff Size: Large)   Pulse 78   Temp (!) 96.9 F (36.1 C) (Temporal)   Resp 16   Wt 148 lb (67.1 kg)   SpO2 98%   BMI 24.63 kg/m  Vitals:   06/26/19 1117  BP: (!) 102/55  Pulse: 78  Resp: 16  Temp: (!) 96.9 F (36.1 C)  TempSrc: Temporal  SpO2: 98%  Weight: 148 lb (67.1 kg)  Body mass index is 24.63 kg/m.   Physical Exam Vitals reviewed.  Constitutional:      General: She is not in acute distress.    Appearance: Normal appearance. She is well-developed. She is not ill-appearing or diaphoretic.  Cardiovascular:     Rate and Rhythm: Normal rate and regular rhythm.     Pulses: Normal pulses.     Heart sounds: Normal heart sounds. No murmur. No friction rub. No gallop.   Pulmonary:     Effort: Pulmonary effort is normal. No respiratory distress.     Breath sounds: Normal breath sounds. No wheezing or rales.  Musculoskeletal:     Cervical back: Normal range of motion and neck supple.     Right lower leg: No edema.     Left lower leg: No edema.  Neurological:     Mental Status: She is alert.      No results found for any visits on 06/26/19.     Assessment & Plan    1. Type 2 diabetes mellitus with hyperglycemia, without long-term current use of insulin (HCC) Samples of Tresiba (basal) insulin were given to patient. Son and daughter were present on instruction of use. Starting with 10 units at bedtime. All questions were answered. Continue Glimepiride 4mg  BID also.   2. Acute metabolic encephalopathy Improving per daughter.   3. Brain metastases (HCC) Stable. Followed by Dr. Janese Banks, oncology.   4. Bone metastases ([place]) Multiple sites (hip, arm, spine). Stable.   5. Stage 4 malignant neoplasm of lung, unspecified  laterality ([place]) Followed by Oncology, Dr. Janese Banks.      Mar Daring, PA-C  [facility]

## 2019-06-25 NOTE — Telephone Encounter (Signed)
Copied from [place] [id number]. Topic: General - Other >> Jun 25, 2019 12:24 PM Rainey Pines A wrote:  Izora Gala from [facility] called for  Verbal orders for OT 1w1 and 2w2 and 1w1. Best contact [phone] (secure vm)

## 2019-06-25 NOTE — Telephone Encounter (Signed)
Yes this is ok. Jeani Hawking was notified yesterday so this may be duplicate.

## 2019-06-25 NOTE — Telephone Encounter (Signed)
Yes this is ok 

## 2019-06-25 NOTE — Telephone Encounter (Signed)
Advised 

## 2019-06-26 ENCOUNTER — Encounter: Payer: Self-pay | Admitting: Physician Assistant

## 2019-06-26 ENCOUNTER — Other Ambulatory Visit: Payer: Self-pay

## 2019-06-26 ENCOUNTER — Ambulatory Visit (INDEPENDENT_AMBULATORY_CARE_PROVIDER_SITE_OTHER): Payer: Medicare Other | Admitting: Physician Assistant

## 2019-06-26 ENCOUNTER — Encounter: Payer: Self-pay | Admitting: Oncology

## 2019-06-26 ENCOUNTER — Ambulatory Visit: Payer: Medicare Other

## 2019-06-26 VITALS — BP 102/55 | HR 78 | Temp 96.9°F | Resp 16 | Wt 148.0 lb

## 2019-06-26 DIAGNOSIS — I2510 Atherosclerotic heart disease of native coronary artery without angina pectoris: Secondary | ICD-10-CM | POA: Diagnosis not present

## 2019-06-26 DIAGNOSIS — D61810 Antineoplastic chemotherapy induced pancytopenia: Secondary | ICD-10-CM | POA: Diagnosis not present

## 2019-06-26 DIAGNOSIS — T451X5S Adverse effect of antineoplastic and immunosuppressive drugs, sequela: Secondary | ICD-10-CM | POA: Diagnosis not present

## 2019-06-26 DIAGNOSIS — C3490 Malignant neoplasm of unspecified part of unspecified bronchus or lung: Secondary | ICD-10-CM | POA: Diagnosis not present

## 2019-06-26 DIAGNOSIS — E1165 Type 2 diabetes mellitus with hyperglycemia: Secondary | ICD-10-CM

## 2019-06-26 DIAGNOSIS — C7951 Secondary malignant neoplasm of bone: Secondary | ICD-10-CM

## 2019-06-26 DIAGNOSIS — I252 Old myocardial infarction: Secondary | ICD-10-CM | POA: Diagnosis not present

## 2019-06-26 DIAGNOSIS — C7931 Secondary malignant neoplasm of brain: Secondary | ICD-10-CM | POA: Diagnosis not present

## 2019-06-26 DIAGNOSIS — I5022 Chronic systolic (congestive) heart failure: Secondary | ICD-10-CM | POA: Diagnosis not present

## 2019-06-26 DIAGNOSIS — G9341 Metabolic encephalopathy: Secondary | ICD-10-CM | POA: Diagnosis not present

## 2019-06-26 DIAGNOSIS — D6959 Other secondary thrombocytopenia: Secondary | ICD-10-CM | POA: Diagnosis not present

## 2019-06-26 DIAGNOSIS — C3492 Malignant neoplasm of unspecified part of left bronchus or lung: Secondary | ICD-10-CM | POA: Diagnosis not present

## 2019-06-26 DIAGNOSIS — I110 Hypertensive heart disease with heart failure: Secondary | ICD-10-CM | POA: Diagnosis not present

## 2019-06-26 DIAGNOSIS — I4891 Unspecified atrial fibrillation: Secondary | ICD-10-CM | POA: Diagnosis not present

## 2019-06-26 DIAGNOSIS — I255 Ischemic cardiomyopathy: Secondary | ICD-10-CM | POA: Diagnosis not present

## 2019-06-26 NOTE — Patient Instructions (Signed)
Insulin Degludec injection What is this medicine? INSULIN DEGLUDEC (IN su lin de GLOO dek) is a human-made form of insulin. This drug lowers the amount of sugar in your blood. It is a long-acting insulin that is usually given once a day. This medicine may be used for other purposes; ask your health care provider or pharmacist if you have questions. COMMON BRAND NAME(S): Tyler Aas What should I tell my health care provider before I take this medicine? They need to know if you have any of these conditions:  episodes of low blood sugar  eye disease, vision problems  kidney disease  liver disease  an unusual or allergic reaction to insulin, other medicines, foods, dyes, or preservatives  pregnant or trying to get pregnant  breast-feeding How should I use this medicine? This medicine is for injection under the skin. Use exactly as directed. This insulin should never be mixed in the same syringe with other insulins before injection. Do not vigorously shake before use. You will be taught how to use this medicine and how to adjust doses for activities and illness. Do not use more insulin than prescribed. Always check the appearance of your insulin before using it. This medicine should be clear and colorless like water. Do not use it if it is cloudy, thickened, colored, or has solid particles in it. If you use an insulin pen, be sure to take off the outer needle cover before using the dose. It is important that you put your used needles and syringes in a special sharps container. Do not put them in a trash can. If you do not have a sharps container, call your pharmacist or healthcare provider to get one. This drug comes with INSTRUCTIONS FOR USE. Ask your pharmacist for directions on how to use this drug. Read the information carefully. Talk to your pharmacist or health care provider if you have questions. Talk to your pediatrician regarding the use of this medicine in children. While this drug may  be prescribed for children as young as 1 year for selected conditions, precautions do apply. Overdosage: If you think you have taken too much of this medicine contact a poison control center or emergency room at once. NOTE: This medicine is only for you. Do not share this medicine with others. What if I miss a dose? For adults: If you miss a dose, take it as soon as you can. Make sure your next dose is taken at least 8 hours later. Do not take double or extra doses. For adolescents and children: It is important not to miss a dose. Your health care professional or doctor should discuss a plan for missed doses with you. If you do miss a dose, follow their plan. Do not take double doses. What may interact with this medicine?  other medicines for diabetes Many medications may cause changes in blood sugar, these include:  alcohol containing beverages  antiviral medicines for HIV or AIDS  aspirin and aspirin-like drugs  certain medicines for blood pressure, heart disease, irregular heart beat  chromium  diuretics  female hormones, such as estrogens or progestins, birth control pills  fenofibrate  gemfibrozil  isoniazid  lanreotide  female hormones or anabolic steroids  MAOIs like Carbex, Eldepryl, Marplan, Nardil, and Parnate  medicines for weight loss  medicines for allergies, asthma, cold, or cough  medicines for depression, anxiety, or psychotic disturbances  niacin  nicotine  NSAIDs, medicines for pain and inflammation, like ibuprofen or naproxen  octreotide  pasireotide  pentamidine  phenytoin  probenecid  quinolone antibiotics such as ciprofloxacin, levofloxacin, ofloxacin  some herbal dietary supplements  steroid medicines such as prednisone or cortisone  sulfamethoxazole; trimethoprim  thyroid hormones Some medications can hide the warning symptoms of low blood sugar (hypoglycemia). You may need to monitor your blood sugar more closely if you are  taking one of these medications. These include:  beta-blockers, often used for high blood pressure or heart problems (examples include atenolol, metoprolol, propranolol)  clonidine  guanethidine  reserpine This list may not describe all possible interactions. Give your health care provider a list of all the medicines, herbs, non-prescription drugs, or dietary supplements you use. Also tell them if you smoke, drink alcohol, or use illegal drugs. Some items may interact with your medicine. What should I watch for while using this medicine? Visit your health care professional or doctor for regular checks on your progress. A test called the HbA1C (A1C) will be monitored. This is a simple blood test. It measures your blood sugar control over the last 2 to 3 months. You will receive this test every 3 to 6 months. Learn how to check your blood sugar. Learn the symptoms of low and high blood sugar and how to manage them. Always carry a quick-source of sugar with you in case you have symptoms of low blood sugar. Examples include hard sugar candy or glucose tablets. Make sure others know that you can choke if you eat or drink when you develop serious symptoms of low blood sugar, such as seizures or unconsciousness. They must get medical help at once. Tell your doctor or health care professional if you have high blood sugar. You might need to change the dose of your medicine. If you are sick or exercising more than usual, you might need to change the dose of your medicine. Do not skip meals. Ask your doctor or health care professional if you should avoid alcohol. Many nonprescription cough and cold products contain sugar or alcohol. These can affect blood sugar. Make sure that you have the right kind of syringe for the type of insulin you use. Try not to change the brand and type of insulin or syringe unless your health care professional or doctor tells you to. Switching insulin brand or type can cause  dangerously high or low blood sugar. Always keep an extra supply of insulin, syringes, and needles on hand. Use a syringe one time only. Throw away syringe and needle in a closed container to prevent accidental needle sticks. Insulin pens and cartridges should never be shared. Even if the needle is changed, sharing may result in passing of viruses like hepatitis or HIV. Each time you get a new box of pen needles, check to see if they are the same type as the ones you were trained to use. If not, ask your health care professional to show you how to use this new type properly. Wear a medical ID bracelet or chain, and carry a card that describes your disease and details of your medicine and dosage times. What side effects may I notice from receiving this medicine? Side effects that you should report to your doctor or health care professional as soon as possible:  allergic reactions like skin rash, itching or hives, swelling of the face, lips, or tongue  breathing problems  signs and symptoms of high blood sugar such as dizziness, dry mouth, dry skin, fruity breath, nausea, stomach pain, increased hunger or thirst, increased urination  signs and symptoms of low blood  sugar such as feeling anxious, confusion, dizziness, increased hunger, unusually weak or tired, sweating, shakiness, cold, irritable, headache, blurred vision, fast heartbeat, loss of consciousness Side effects that usually do not require medical attention (report to your doctor or health care professional if they continue or are bothersome):  increase or decrease in fatty tissue under the skin due to overuse of a particular injection site  itching, burning, swelling, or rash at site where injected This list may not describe all possible side effects. Call your doctor for medical advice about side effects. You may report side effects to FDA at 1-800-FDA-1088. Where should I keep my medicine? Keep out of the reach of children. Unopened  Vials: Tresiba vials: Store in a refrigerator between 2 and 8 degrees C (36 and 46 degrees F) or at room temperature below 30 degrees C (86 degrees F). Do not freeze or use if the insulin has been frozen. Protect from light and excessive heat. If stored at room temperature, the vial must be discarded after 56 days (8 weeks). Throw away any unopened and unused medicine that has been stored in the refrigerator after the expiration date. Unopened Pens: [place] FlexTouch pens: Store in a refrigerator between 2 and 8 degrees C (36 and 46 degrees F) or at room temperature below 30 degrees C (86 degrees F). Do not freeze or use if the insulin has been frozen. Protect from light and excessive heat. If stored at room temperature, the pen must be discarded after 56 days (8 weeks). Throw away any unopened and unused medicine that has been stored in the refrigerator after the expiration date. Vials that you are using: Tresiba vials: Store in a refrigerator or at room temperature below 30 degrees C (86 degrees F). Do not freeze. Keep away from heat and light. Throw the opened vial away after 56 days (8 weeks). Pens that you are using: [place] FlexTouch pens: Store in a refrigerator or at room temperature below 30 degrees C (86 degrees F). Do not freeze. Keep away from heat and light. Throw the pen away after 56 days (8 weeks), even if it still has insulin left in it. NOTE: This sheet is a summary. It may not cover all possible information. If you have questions about this medicine, talk to your doctor, pharmacist, or health care provider.  2020 Elsevier/Gold Standard (2018-12-09 08:05:09)

## 2019-06-27 ENCOUNTER — Telehealth: Payer: Self-pay | Admitting: Oncology

## 2019-06-27 ENCOUNTER — Encounter: Payer: Self-pay | Admitting: Physician Assistant

## 2019-06-27 ENCOUNTER — Encounter: Payer: Self-pay | Admitting: Emergency Medicine

## 2019-06-27 ENCOUNTER — Emergency Department
Admission: EM | Admit: 2019-06-27 | Discharge: 2019-06-27 | Disposition: A | Discharge status: Home / Self Care | Payer: Medicare Other | Attending: Emergency Medicine | Admitting: Emergency Medicine

## 2019-06-27 ENCOUNTER — Other Ambulatory Visit: Payer: Self-pay

## 2019-06-27 DIAGNOSIS — I509 Heart failure, unspecified: Secondary | ICD-10-CM | POA: Diagnosis not present

## 2019-06-27 DIAGNOSIS — Z7901 Long term (current) use of anticoagulants: Secondary | ICD-10-CM | POA: Diagnosis not present

## 2019-06-27 DIAGNOSIS — Z95810 Presence of automatic (implantable) cardiac defibrillator: Secondary | ICD-10-CM | POA: Diagnosis not present

## 2019-06-27 DIAGNOSIS — Z91040 Latex allergy status: Secondary | ICD-10-CM | POA: Diagnosis not present

## 2019-06-27 DIAGNOSIS — Z79899 Other long term (current) drug therapy: Secondary | ICD-10-CM | POA: Diagnosis not present

## 2019-06-27 DIAGNOSIS — I252 Old myocardial infarction: Secondary | ICD-10-CM | POA: Insufficient documentation

## 2019-06-27 DIAGNOSIS — Z87891 Personal history of nicotine dependence: Secondary | ICD-10-CM | POA: Diagnosis not present

## 2019-06-27 DIAGNOSIS — E1165 Type 2 diabetes mellitus with hyperglycemia: Secondary | ICD-10-CM | POA: Insufficient documentation

## 2019-06-27 DIAGNOSIS — I110 Hypertensive heart disease with heart failure: Secondary | ICD-10-CM | POA: Insufficient documentation

## 2019-06-27 DIAGNOSIS — Z7984 Long term (current) use of oral hypoglycemic drugs: Secondary | ICD-10-CM | POA: Insufficient documentation

## 2019-06-27 DIAGNOSIS — R739 Hyperglycemia, unspecified: Secondary | ICD-10-CM

## 2019-06-27 DIAGNOSIS — I2510 Atherosclerotic heart disease of native coronary artery without angina pectoris: Secondary | ICD-10-CM | POA: Insufficient documentation

## 2019-06-27 DIAGNOSIS — Z96651 Presence of right artificial knee joint: Secondary | ICD-10-CM | POA: Insufficient documentation

## 2019-06-27 DIAGNOSIS — E1140 Type 2 diabetes mellitus with diabetic neuropathy, unspecified: Secondary | ICD-10-CM | POA: Insufficient documentation

## 2019-06-27 LAB — GLUCOSE, CAPILLARY
Glucose-Capillary: 386 mg/dL — ABNORMAL HIGH (ref 70–99)
Glucose-Capillary: 397 mg/dL — ABNORMAL HIGH (ref 70–99)
Glucose-Capillary: 306 mg/dL — ABNORMAL HIGH (ref 70–99)

## 2019-06-27 LAB — CBC
HCT: 39.4 % (ref 36.0–46.0)
Hemoglobin: 13.8 g/dL (ref 12.0–15.0)
MCH: 28.2 pg (ref 26.0–34.0)
MCHC: 35 g/dL (ref 30.0–36.0)
MCV: 80.4 fL (ref 80.0–100.0)
Platelets: 173 10*3/uL (ref 150–400)
RBC: 4.9 MIL/uL (ref 3.87–5.11)
RDW: 17.5 % — ABNORMAL HIGH (ref 11.5–15.5)
WBC: 14.7 10*3/uL — ABNORMAL HIGH (ref 4.0–10.5)
nRBC: 1.3 % — ABNORMAL HIGH (ref 0.0–0.2)

## 2019-06-27 LAB — BASIC METABOLIC PANEL
Anion gap: 10 (ref 5–15)
BUN: 17 mg/dL (ref 8–23)
CO2: 23 mmol/L (ref 22–32)
Calcium: 7.3 mg/dL — ABNORMAL LOW (ref 8.9–10.3)
Chloride: 98 mmol/L (ref 98–111)
Creatinine, Ser: 0.46 mg/dL (ref 0.44–1.00)
GFR calc Af Amer: >60 mL/min (ref >60)
GFR calc non Af Amer: >60 mL/min (ref >60)
Glucose, Bld: 409 mg/dL — ABNORMAL HIGH (ref 70–99)
Potassium: 4 mmol/L (ref 3.5–5.1)
Sodium: 131 mmol/L — ABNORMAL LOW (ref 135–145)

## 2019-06-27 MED ORDER — INSULIN ASPART 100 UNIT/ML ~~LOC~~ SOLN
7.0000 [IU] | Freq: Once | SUBCUTANEOUS | Status: AC
  Administered 2019-06-27: 7 [IU] via SUBCUTANEOUS
  Filled 2019-06-27: qty 1

## 2019-06-27 MED ORDER — SODIUM CHLORIDE 0.9 % IV BOLUS
500.0000 mL | Freq: Once | INTRAVENOUS | Status: AC
  Administered 2019-06-27: 500 mL via INTRAVENOUS

## 2019-06-27 MED ORDER — INSULIN ASPART 100 UNIT/ML ~~LOC~~ SOLN: 5.0000 [IU] | Freq: Once | SUBCUTANEOUS | Status: DC

## 2019-06-27 NOTE — ED Provider Notes (Signed)
[facility] EMERGENCY DEPARTMENT Provider Note   CSN: [id number] Arrival date & time: 06/27/19  2014     History Chief Complaint  Patient presents with  . Hyperglycemia    Temprence Rhines is a 79 y.o. female history of CHF, diabetes, hypertension, brain cancer on Decadron here presenting with hyperglycemia.  Patient was recently seen in the ED for confusion and was put on Decadron since last week.  Patient was noted to have increasing blood sugar this week.  Daughter noticed since yesterday, her blood sugar is 300-400 with a max of 490.  Patient is confused at baseline but denies any vomiting or fevers.  Denies any abdominal pain.  She was just started on long-acting insulin yesterday and has been using yesterday.  She called the doctor's office and was sent here for further evaluation.  The history is provided by the patient.       Past Medical History:  Diagnosis Date  . AICD (automatic cardioverter/defibrillator) present   . Anxiety   . CHF (congestive heart failure) ([place])   . Diabetes mellitus with neuropathy ([place])   . Dyspnea   . Hypertension   . Hypertensive heart disease   . Implantable cardioverter-defibrillator (ICD)-[employer]   . Myocardial infarction, anterior wall ([place])   . Obesity, Class II, BMI 35-39.9     Patient Active Problem List   Diagnosis Date Noted  . Foley catheter in place on admission 06/16/2019  . Chemotherapy-induced thrombocytopenia 06/16/2019  . Urinary retention 06/15/2019  . Altered mental status 06/15/2019  . Acute metabolic encephalopathy 37/16/9678  . History of cardioversion 06/15/2019  . History of atrial fibrillation 06/15/2019  . Generalized weakness 06/15/2019  . Acute focal neurological deficit 06/15/2019  . Brain metastases ([place])   . Wheeze   . Stage 4 malignant neoplasm of lung ([place]) 05/11/2019  . Atrial fibrillation with RVR ([place]) 05/11/2019  . Goals of care, counseling/discussion 04/30/2019  . Bone  metastases ([place]) 04/30/2019  . Cardiac defibrillator in place 04/20/2019  . Eyelid dermatitis, allergic/contact- bilateral  02/25/2019  . Closed displaced transverse fracture of shaft of left humerus 02/20/2019  . Coronary artery disease involving native coronary artery of native heart without angina pectoris 01/15/2019  . Chronic HFrEF (heart failure with reduced ejection fraction) ([place]) 01/15/2019  . Ischemic cardiomyopathy 01/15/2019  . Hyperlipidemia LDL goal <70 01/15/2019  . GAD (generalized anxiety disorder) 05/16/2018  . Allergic rhinitis 05/03/2017  . Arthritis 05/03/2017  . Cataract 05/03/2017  . Congestive heart failure ([place]) 05/03/2017  . Diabetes mellitus without complication ([place]) 93/81/0175  . Essential hypertension 05/03/2017  . History of heart attack 05/03/2017  . H/O thyroidectomy 05/03/2017  . H/O total knee replacement 05/03/2017    Past Surgical History:  Procedure Laterality Date  . CARDIOVERSION N/A 05/12/2019   Procedure: CARDIOVERSION;  Surgeon: Nelva Bush, MD;  Location: [facility] ORS;  Service: Cardiovascular;  Laterality: N/A;  . CARPAL TUNNEL RELEASE    . cataract surgery    . EYE SURGERY    . FRACTURE SURGERY Left    ARM  . JOINT REPLACEMENT Left    tkr  . PORTA CATH INSERTION N/A 05/14/2019   Procedure: PORTA CATH INSERTION;  Surgeon: Algernon Huxley, MD;  Location: [facility];  Service: Cardiovascular;  Laterality: N/A;  . VIDEO BRONCHOSCOPY WITH ENDOBRONCHIAL ULTRASOUND N/A 05/11/2019   Procedure: VIDEO BRONCHOSCOPY WITH ENDOBRONCHIAL ULTRASOUND;  Surgeon: Tyler Pita, MD;  Location: [facility] ORS;  Service: Pulmonary;  Laterality: N/A;  OB History   No obstetric history on file.     Family History  Problem Relation Age of Onset  . Scleroderma Mother   . Raynaud syndrome Mother   . Cancer Father 68       colon cancer  . Heart disease Brother        died of heart attack  . Diabetes Brother     Social History   Tobacco Use    . Smoking status: Former Smoker    Packs/day: 1.00    Years: 55.00    Pack years: 55.00    Types: Cigarettes    Quit date: 2006    Years since quitting: 15.2  . Smokeless tobacco: Never Used  . Tobacco comment: quit about 18 years ago; as of 2019  Substance Use Topics  . Alcohol use: No  . Drug use: No    Home Medications Prior to Admission medications   Medication Sig Start Date End Date Taking? Authorizing Provider  albuterol (VENTOLIN HFA) 108 (90 Base) MCG/ACT inhaler Inhale 1 puff into the lungs every 6 (six) hours as needed for wheezing or shortness of breath. 05/14/19   Loletha Grayer, MD  apixaban (ELIQUIS) 5 MG TABS tablet Take 1 tablet (5 mg total) by mouth 2 (two) times daily. 05/18/19   Mar Daring, PA-C  atorvastatin (LIPITOR) 40 MG tablet TAKE 1 TABLET BY MOUTH  DAILY Patient taking differently: Take 40 mg by mouth daily.  01/12/19   Mar Daring, PA-C  clonazePAM (KLONOPIN) 1 MG tablet TAKE 1 TABLET BY MOUTH THREE TIMES A DAY AS NEEDED FOR ANXIETY 05/25/19   Mar Daring, PA-C  dexamethasone (DECADRON) 4 MG tablet Take 1 tablet (4 mg total) by mouth 3 (three) times daily for 10 days. 06/19/19 06/29/19  Wyvonnia Dusky, MD  furosemide (LASIX) 40 MG tablet Take 1 tablet (40 mg total) by mouth daily as needed for fluid. Patient not taking: Reported on 06/26/2019 05/18/19   Mar Daring, PA-C  glimepiride (AMARYL) 2 MG tablet Take 1 tablet (2 mg total) by mouth 2 (two) times daily. Patient taking differently: Take 2 mg by mouth 2 (two) times daily.  05/18/19   Mar Daring, PA-C  levothyroxine (SYNTHROID) 100 MCG tablet Take 1 tablet (100 mcg total) by mouth daily at 6 (six) AM. 05/18/19   Burnette, Clearnce Sorrel, PA-C  lisinopril (ZESTRIL) 10 MG tablet TAKE 1 TABLET BY MOUTH  DAILY Patient taking differently: Take 10 mg by mouth daily.  01/12/19   Mar Daring, PA-C  magic mouthwash w/lidocaine SOLN Take 5 mLs by mouth 4 (four) times  daily. 06/09/19   Jacquelin Hawking, NP  metFORMIN (GLUCOPHAGE-XR) 500 MG 24 hr tablet Take 1 tablet (500 mg total) by mouth 2 (two) times daily. 05/18/19   Mar Daring, PA-C  metoprolol tartrate (LOPRESSOR) 50 MG tablet Take 1 tablet (50 mg total) by mouth 2 (two) times daily. 05/18/19   Mar Daring, PA-C  oxyCODONE (OXY IR/ROXICODONE) 5 MG immediate release tablet Take 1 tablet (5 mg total) by mouth every 4 (four) hours as needed for severe pain. Patient not taking: Reported on 06/26/2019 05/08/19   Sindy Guadeloupe, MD  pantoprazole (PROTONIX) 40 MG tablet Take 1 tablet (40 mg total) by mouth daily. 06/20/19 07/20/19  Wyvonnia Dusky, MD  sitaGLIPtin (JANUVIA) 100 MG tablet Take 100 mg by mouth daily.    [provider]    Allergies    Latex  and Nickel  Review of Systems   Review of Systems  Gastrointestinal: Negative for vomiting.  Psychiatric/Behavioral: Positive for confusion.  All other systems reviewed and are negative.   Physical Exam Updated Vital Signs BP 138/73   Pulse (!) 59   Temp (!) 97.5 F (36.4 C) (Oral)   Resp 18   Ht 5' (1.524 m)   Wt 67.1 kg   SpO2 97%   BMI 28.90 kg/m   Physical Exam Vitals and nursing note reviewed.  Constitutional:      Comments: Confused (baseline)   HENT:     Head: Normocephalic.     Nose: Nose normal.     Mouth/Throat:     Mouth: Mucous membranes are moist.  Eyes:     Extraocular Movements: Extraocular movements intact.     Pupils: Pupils are equal, round, and reactive to light.  Cardiovascular:     Rate and Rhythm: Normal rate and regular rhythm.     Pulses: Normal pulses.     Heart sounds: Normal heart sounds.  Pulmonary:     Effort: Pulmonary effort is normal.     Breath sounds: Normal breath sounds.  Abdominal:     General: Abdomen is flat.     Palpations: Abdomen is soft.  Musculoskeletal:        General: Normal range of motion.     Cervical back: Normal range of motion.  Skin:    General:  Skin is warm.     Capillary Refill: Capillary refill takes less than 2 seconds.  Neurological:     Comments: Confused, A & O x 2 (baseline), moving all extremities   Psychiatric:        Mood and Affect: Mood normal.        Behavior: Behavior normal.     ED Results / Procedures / Treatments   Labs (all labs ordered are listed, but only abnormal results are displayed) Labs Reviewed  GLUCOSE, CAPILLARY - Abnormal; Notable for the following components:      Result Value   Glucose-Capillary 386 (*)    All other components within normal limits  CBC - Abnormal; Notable for the following components:   WBC 14.7 (*)    RDW 17.5 (*)    nRBC 1.3 (*)    All other components within normal limits  BASIC METABOLIC PANEL - Abnormal; Notable for the following components:   Sodium 131 (*)    Glucose, Bld 409 (*)    Calcium 7.3 (*)    All other components within normal limits  GLUCOSE, CAPILLARY - Abnormal; Notable for the following components:   Glucose-Capillary 397 (*)    All other components within normal limits  GLUCOSE, CAPILLARY - Abnormal; Notable for the following components:   Glucose-Capillary 306 (*)    All other components within normal limits  URINALYSIS, COMPLETE (UACMP) WITH MICROSCOPIC  CBG MONITORING, ED  CBG MONITORING, ED  CBG MONITORING, ED    EKG None  Radiology No results found.  Procedures Procedures (including critical care time)  Medications Ordered in ED Medications  sodium chloride 0.9 % bolus 500 mL (500 mLs Intravenous New Bag/Given 06/27/19 2158)  insulin aspart (novoLOG) injection 7 Units (7 Units Subcutaneous Given 06/27/19 2157)    ED Course  I have reviewed the triage vital signs and the nursing notes.  Pertinent labs & imaging results that were available during my care of the patient were reviewed by me and considered in my medical decision making (see chart for details).  MDM Rules/Calculators/A&P                      Avy Barlett is  a 79 y.o. female here presenting with hyperglycemia.  Patient was recently started on Decadron for her brain mets.  Patient is confused which is baseline.  I think her hypoglycemia is likely secondary to Decadron use.  She was just started on long-acting insulin yesterday.  I do not think she has any signs of DKA.  We will check some basic blood work and give her subcutaneous regular insulin and recheck her blood sugar.  10:59 PM AG normal. Glucose dec to 306 from 400. Stable for discharge   Final Clinical Impression(s) / ED Diagnoses Final diagnoses:  None    Rx / DC Orders ED Discharge Orders    None       Drenda Freeze, MD 06/27/19 2308

## 2019-06-27 NOTE — Progress Notes (Signed)
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 She was also a chronic smoker but quit smoking in 2006. More recently patient presented with pain in her left arm which led to an x-ray. X-ray showed comminuted possibly pathologic midshaft humeral fracture. This was followed by a whole-body bone scan which showed multiple sites of abnormal tracer uptake involving left scapula, bilateral posterior ribs, thoracic and lumbar spine, pelvis and multiple sites in the proximal and distal left femur as well as left humeral diaphysis.  PET CT scan shows 4 cm left retrohilar mass in the upper lobe but invading across the major fissure into the left lower lobe with an SUV of 9.1. Scattered osseous metastasis with humeral pathological fracture.  Mri brain showed brain mets  She completed palliative WBRT and palliative RT to left arm and right hip  Acetabular biopsy showed adenocarcinoma. NGS panel which came back after 1 cycle of chemotherapy showed EGFR C2573T >G (L858R). PDL1 2%   Interval history-patient was admitted to the hospital for altered mental status and had a repeat CT head which showed Mild increase in the size of left frontal parenchymal metastases from 11 x 8 mm to 15 x 8 mm with surrounding increase  in edema.  She received high-dose steroids 4 mg IV every 6 but despite 1 week of steroids there has not been a significant improvement in her mental status.  Family states that she has paucity of word finding.  Also does not seem to show any interest and speaking to the family members as well as trying to reach out to her friends whom she used to talk to over the phone.  She needs to be reminded to use the restroom.  She is able to ambulate around the house but needs some assistance to prevent a fall.  Blood sugars have also been running high because of ongoing steroids.    ECOG PS- 2 Pain scale- 0 Opioid associated constipation- no  Review of systems- Review of Systems  Constitutional: Negative for chills, fever, malaise/fatigue and weight loss.  HENT: Negative for congestion, ear discharge and nosebleeds.   Eyes: Negative for blurred vision.  Respiratory: Negative for cough, hemoptysis, sputum production, shortness of breath and wheezing.   Cardiovascular: Negative for chest pain, palpitations, orthopnea and claudication.  Gastrointestinal: Negative for abdominal pain, blood in stool, constipation, diarrhea, heartburn, melena, nausea and vomiting.  Genitourinary: Negative for dysuria, flank pain, frequency, hematuria and urgency.  Musculoskeletal: Negative for back pain, joint pain and myalgias.  Skin: Negative for rash.  Neurological: Negative for dizziness, tingling, focal weakness, seizures, weakness and headaches.  Endo/Heme/Allergies: Does not bruise/bleed easily.  Psychiatric/Behavioral: Negative for depression and suicidal ideas. The patient does not have insomnia.       Allergies  Allergen Reactions   Latex Rash   Nickel Rash     Past Medical History:  Diagnosis Date  AICD (automatic cardioverter/defibrillator) present    Anxiety    CHF (congestive heart failure) ([place])    Diabetes mellitus with neuropathy (HCC)    Dyspnea    Hypertension    Hypertensive heart  disease    Implantable cardioverter-defibrillator (ICD)-[employer]    Myocardial infarction, anterior wall (HCC)    Obesity, Class II, BMI 35-39.9      Past Surgical History:  Procedure Laterality Date   CARDIOVERSION N/A 05/12/2019   Procedure: CARDIOVERSION;  Surgeon: Nelva Bush, MD;  Location: [facility] ORS;  Service: Cardiovascular;  Laterality: N/A;   CARPAL TUNNEL RELEASE     cataract surgery     EYE SURGERY     FRACTURE SURGERY Left    ARM   JOINT REPLACEMENT Left    tkr   PORTA CATH INSERTION N/A 05/14/2019   Procedure: PORTA CATH INSERTION;  Surgeon: Algernon Huxley, MD;  Location: [facility];  Service: Cardiovascular;  Laterality: N/A;   VIDEO BRONCHOSCOPY WITH ENDOBRONCHIAL ULTRASOUND N/A 05/11/2019   Procedure: VIDEO BRONCHOSCOPY WITH ENDOBRONCHIAL ULTRASOUND;  Surgeon: Tyler Pita, MD;  Location: [facility] ORS;  Service: Pulmonary;  Laterality: N/A;    Social History   Socioeconomic History   Marital status: Widowed    Spouse name: Not on file   Number of children: 3   Years of education: Not on file   Highest education level: High school graduate  Occupational History   Occupation: retired  Tobacco Use   Smoking status: Former Smoker    Packs/day: 1.00    Years: 55.00    Pack years: 55.00    Types: Cigarettes    Quit date: 2006    Years since quitting: 15.2   Smokeless tobacco: Never Used   Tobacco comment: quit about 18 years ago; as of 2019  Substance and Sexual Activity   Alcohol use: No   Drug use: No   Sexual activity: Not on file  Other Topics Concern   Not on file  Social History Narrative   Not on file   Social Determinants of Health   Financial Resource Strain:    Difficulty of Paying Living Expenses:   Food Insecurity:    Worried About Charity fundraiser in the Last Year:    Arboriculturist in the Last Year:   Transportation Needs:    Film/video editor (Medical):    Lack of Transportation  (Non-Medical):   Physical Activity: Inactive   Days of Exercise per Week: 0 days   Minutes of Exercise per Session: 0 min  Stress: No Stress Concern Present   Feeling of Stress : Not at all  Social Connections: Unknown   Frequency of Communication with Friends and Family: Patient refused   Frequency of Social Gatherings with Friends and Family: Patient refused   Attends Religious Services: Patient refused   Marine scientist or Organizations: Patient refused   Attends Music therapist: Patient refused   Marital Status: Patient refused  Intimate Production manager Violence: Unknown   Fear of Current or Ex-Partner: Patient refused   Emotionally Abused: Patient refused   Physically Abused: Patient refused   Sexually Abused: Patient refused    Family History  Problem Relation Age of Onset   Scleroderma Mother    Raynaud syndrome Mother    Cancer Father 24       colon cancer   Heart disease Brother        died of heart attack   Diabetes Brother  Current Outpatient Medications:    albuterol (VENTOLIN HFA) 108 (90 Base) MCG/ACT inhaler, Inhale 1 puff into the lungs every 6 (six) hours as needed for wheezing or shortness of breath., Disp: 18 g, Rfl: 8   apixaban (ELIQUIS) 5 MG TABS tablet, Take 1 tablet (5 mg total) by mouth 2 (two) times daily., Disp: 180 tablet, Rfl: 1   atorvastatin (LIPITOR) 40 MG tablet, TAKE 1 TABLET BY MOUTH  DAILY (Patient taking differently: Take 40 mg by mouth daily. ), Disp: 90 tablet, Rfl: 3   clonazePAM (KLONOPIN) 1 MG tablet, TAKE 1 TABLET BY MOUTH THREE TIMES A DAY AS NEEDED FOR ANXIETY, Disp: 90 tablet, Rfl: 5   dexamethasone (DECADRON) 4 MG tablet, Take 1 tablet (4 mg total) by mouth 3 (three) times daily for 10 days., Disp: 30 tablet, Rfl: 0   furosemide (LASIX) 40 MG tablet, Take 1 tablet (40 mg total) by mouth daily as needed for fluid. (Patient not taking: Reported on 06/26/2019), Disp: 90 tablet, Rfl: 1    glimepiride (AMARYL) 2 MG tablet, Take 1 tablet (2 mg total) by mouth 2 (two) times daily. (Patient taking differently: Take 2 mg by mouth 2 (two) times daily. ), Disp: 180 tablet, Rfl: 1   levothyroxine (SYNTHROID) 100 MCG tablet, Take 1 tablet (100 mcg total) by mouth daily at 6 (six) AM., Disp: 90 tablet, Rfl: 1   lisinopril (ZESTRIL) 10 MG tablet, TAKE 1 TABLET BY MOUTH  DAILY (Patient taking differently: Take 10 mg by mouth daily. ), Disp: 90 tablet, Rfl: 3   magic mouthwash w/lidocaine SOLN, Take 5 mLs by mouth 4 (four) times daily., Disp: 240 mL, Rfl: 0   metFORMIN (GLUCOPHAGE-XR) 500 MG 24 hr tablet, Take 1 tablet (500 mg total) by mouth 2 (two) times daily., Disp: 180 tablet, Rfl: 1   metoprolol tartrate (LOPRESSOR) 50 MG tablet, Take 1 tablet (50 mg total) by mouth 2 (two) times daily., Disp: 180 tablet, Rfl: 1   oxyCODONE (OXY IR/ROXICODONE) 5 MG immediate release tablet, Take 1 tablet (5 mg total) by mouth every 4 (four) hours as needed for severe pain. (Patient not taking: Reported on 06/26/2019), Disp: 60 tablet, Rfl: 0   pantoprazole (PROTONIX) 40 MG tablet, Take 1 tablet (40 mg total) by mouth daily., Disp: 30 tablet, Rfl: 0   sitaGLIPtin (JANUVIA) 100 MG tablet, Take 100 mg by mouth daily., Disp: , Rfl:   Physical exam:  Vitals:   06/23/19 1122  BP: 100/78  Pulse: (!) 54  Temp: 97.6 F (36.4 C)  TempSrc: Tympanic  SpO2: 96%  Weight: 148 lb (67.1 kg)   Physical Exam HENT:     Head: Normocephalic and atraumatic.  Eyes:     Pupils: Pupils are equal, round, and reactive to light.  Cardiovascular:     Rate and Rhythm: Normal rate and regular rhythm.     Heart sounds: Normal heart sounds.  Pulmonary:     Effort: Pulmonary effort is normal.     Breath sounds: Normal breath sounds.  Abdominal:     General: Bowel sounds are normal.     Palpations: Abdomen is soft.  Musculoskeletal:     Cervical back: Normal range of motion.  Skin:    General: Skin is warm and dry.   Neurological:     Mental Status: She is alert and oriented to person, place, and time.     Comments: Patient was able to answer questions about her children as well as their birthdays, president of  [place], month of the year.  There continues to be paucity of thoughts and word finding      CMP Latest Ref Rng & Units 06/25/2019  Glucose 70 - 99 mg/dL 374(H)  BUN 8 - 23 mg/dL 17  Creatinine 0.44 - 1.00 mg/dL 0.53  Sodium 135 - 145 mmol/L 130(L)  Potassium 3.5 - 5.1 mmol/L 4.0  Chloride 98 - 111 mmol/L 97(L)  CO2 22 - 32 mmol/L 21(L)  Calcium 8.9 - 10.3 mg/dL 6.7(L)  Total Protein 6.5 - 8.1 g/dL 5.6(L)  Total Bilirubin 0.3 - 1.2 mg/dL 0.9  Alkaline Phos 38 - 126 U/L 246(H)  AST 15 - 41 U/L 19  ALT 0 - 44 U/L 34   CBC Latest Ref Rng & Units 06/25/2019  WBC 4.0 - 10.5 K/uL 12.1(H)  Hemoglobin 12.0 - 15.0 g/dL 13.5  Hematocrit 36.0 - 46.0 % 39.9  Platelets 150 - 400 K/uL 240    No images are attached to the encounter.  CT HEAD WO CONTRAST  Result Date: 06/15/2019 CLINICAL DATA:  Altered mental status. EXAM: CT HEAD WITHOUT CONTRAST TECHNIQUE: Contiguous axial images were obtained from the base of the skull through the vertex without intravenous contrast. COMPARISON:  May 08, 2019. FINDINGS: Brain: Increased left frontal lobe low density with sulcal effacement is noted concerning for infarction or edema secondary to underlying metastatic lesion. 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 Ventricular size is within normal limits. No definite evidence of hemorrhage is noted. No midline shift is noted. Vascular: No hyperdense vessel or unexpected calcification. Skull: Normal. Negative for fracture or focal lesion. Sinuses/Orbits: Bilateral ethmoid and sphenoid sinusitis. Other: None. IMPRESSION: Increased left frontal lobe low density with sulcal effacement is noted concerning for infarction or edema secondary to underlying metastatic lesion. MRI with and without gadolinium is recommended for  further evaluation. Electronically Signed   By: Marijo Conception M.D.   On: 06/15/2019 16:57   CT HEAD W CONTRAST  Result Date: 06/15/2019 CLINICAL DATA:  Lung cancer with abnormal noncontrast CT EXAM: CT HEAD WITH CONTRAST TECHNIQUE: Contiguous axial images were obtained from the base of the skull through the vertex with intravenous contrast. CONTRAST:  44m OMNIPAQUE IOHEXOL 300 MG/ML  SOLN COMPARISON:  Earlier same day, contrast CT 05/08/2019 FINDINGS: Brain: Increase in size of left frontal parenchymal lesion now measuring 14 x 8 mm (previously 11 x 8 mm). Surrounding edema has also increased. Additional more apparent but similar size 7 mm right frontal lesion (series 4, image 15). Possible additional subcentimeter right parietal lesions (series 2, image 22 and series 4, image 9); the latter was present on the prior study and is unchanged. Chronic left parietal infarction. No significant mass effect. No hydrocephalus. Vascular: Unremarkable. Skull: No new findings. Sinuses/Orbits: No acute finding. Other: None. IMPRESSION: Increase in size of left frontal metastasis and associated edema. Additional three subcentimeter lesions as described. Electronically Signed   By: PMacy MisM.D.   On: 06/15/2019 20:27   DG Chest Portable 1 View  Result Date: 06/15/2019 CLINICAL DATA:  Fever, metastatic lung cancer EXAM: PORTABLE CHEST 1 VIEW COMPARISON:  None. FINDINGS: Left perihilar density likely reflecting partially visualized mass. There is mild elevation of the left hemidiaphragm. Mild nonspecific interstitial prominence. Heart size is likely within normal limits for portable technique. Left chest wall ICD is present. Right chest wall port catheter tip overlies cavoatrial junction. IMPRESSION: Left perihilar density likely reflecting partially visualized mass. Mild elevation of the left hemidiaphragm. Mild bibasilar atelectasis and  nonspecific interstitial prominence. Electronically Signed   By: Macy Mis  M.D.   On: 06/15/2019 18:42   CUP PACEART REMOTE DEVICE CHECK  Result Date: 06/22/2019 Scheduled remote reviewed. OptiVol alert triggered 06/21/19; TI below baseline since last interrogation on 06/08/19. No shocks or detects. Next remote 91 days. Felisa Bonier, RN, MSN Scheduled remote reviewed. OptiVol alert triggered 06/21/19; TI below baseline since last interrogation on 06/08/19. No shocks or detects. Next remote 91 days. Felisa Bonier, RN, MSN    Assessment and plan- Patient is a 79 y.o. 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  She is here to discuss further management  1.  Brain metastases with edema: Despite staying on steroids for over a week, there has not been significant improvement in her mental status.  She appears to be more oriented today and did answer my basic questions correctly but there continues to be paucity of thought of word finding.  It would be unlikely for chemotherapy to do this as there is not much CNS penetration.  Other possibilities include the edema itself causing mental status changes versus location of the frontal lobe metastases which controlled general behavior versus possible toxicity from whole brain radiation.  Among all needs factors the only potential reversible factor is the brain edema.  Since she has not had significant benefit after steroids, I discussed trying bevacizumab/avastin 10 mg/kg IV every 2 weeks initially at least for 4-6 doses to see if there is any further improvement in her mental status.  Patient is already on Eliquis for atrial fibrillation.  Discussed risks and benefits of bevacizumab including all but not limited to leg swelling, proteinuria and possible risk of thromboembolic events.  Patient has limited insight into her ongoing health issues.  She is here with her daughter and I spoke to her son Harrell Gave over the phone later today as well.  They are in agreement over starting bevacizumab at this time.  I will  see her back in 2 weeks time and consider a steroid taper at that time.  She continues to have elevated blood sugars and I have also reached out to her primary care provider Fenton Malling to see if we could make any further adjustments to her diabetes medications.  2.  Stage IV lung cancer: I will consider starting Tagrisso with her next dose of bevacizumab.   Total face to face encounter time for this patient visit was 40 min.     Visit Diagnosis 1. Brain metastases ([place])   2. Stage 4 malignant neoplasm of lung, unspecified laterality ([place])      Dr. Randa Evens, MD, MPH [facility] [phone] 06/27/2019 7:45 AM

## 2019-06-27 NOTE — Discharge Instructions (Signed)
Take your insulin as prescribed   See your doctor   Return to ER if you have vomiting, confusion, abdominal pain, fever.

## 2019-06-27 NOTE — Telephone Encounter (Signed)
Daughter called. Patient's glucose has been high as she is on Dexamethasone.  She was recently started on 10 units of Tresiba. Per daughter blood glucose is very high,  Running between 325- 498.  I discussed with her that DM is poorly controlled due to Dexamethasone for edema from brain metastasis. She likely will benefit from sliding scale. I encourage patient's daughter to further discuss with her PCP.  Given that her glucose level is approaching 500, recommend patient to go to ER for further management.

## 2019-06-27 NOTE — ED Triage Notes (Signed)
Patient brought in by daughter. Daughter reports that patient's blood sugar has been running high all day. Daughter states that her blood sugar this evening after dinner was 498 after dinner. Daughter states that she was started on long acting insulin yesterday due to her blood sugar being elevated but it has been higher today.

## 2019-06-27 NOTE — ED Notes (Signed)
Pt is a new user for insulin. PT states that her BG was 498 at home.

## 2019-06-28 LAB — CULTURE, BLOOD (ROUTINE X 2)
Culture: NO GROWTH
Culture: NO GROWTH
Special Requests: ADEQUATE
Special Requests: ADEQUATE

## 2019-06-29 ENCOUNTER — Encounter: Payer: Self-pay | Admitting: Radiation Oncology

## 2019-06-29 ENCOUNTER — Other Ambulatory Visit: Payer: Self-pay | Admitting: Pharmacist

## 2019-06-29 ENCOUNTER — Other Ambulatory Visit: Payer: Self-pay | Admitting: *Deleted

## 2019-06-29 ENCOUNTER — Inpatient Hospital Stay (HOSPITAL_BASED_OUTPATIENT_CLINIC_OR_DEPARTMENT_OTHER): Payer: Medicare Other | Admitting: Oncology

## 2019-06-29 ENCOUNTER — Encounter: Payer: Self-pay | Admitting: Oncology

## 2019-06-29 ENCOUNTER — Inpatient Hospital Stay: Payer: Medicare Other

## 2019-06-29 ENCOUNTER — Other Ambulatory Visit: Payer: Self-pay

## 2019-06-29 ENCOUNTER — Ambulatory Visit
Admission: RE | Admit: 2019-06-29 | Discharge: 2019-06-29 | Disposition: A | Discharge status: Home / Self Care | Payer: Medicare Other | Source: Ambulatory Visit | Attending: Radiation Oncology | Admitting: Radiation Oncology

## 2019-06-29 ENCOUNTER — Encounter: Payer: Self-pay | Admitting: Physician Assistant

## 2019-06-29 VITALS — BP 145/78 | HR 66 | Temp 97.4°F | Resp 18

## 2019-06-29 VITALS — BP 145/78 | HR 66 | Temp 97.4°F

## 2019-06-29 DIAGNOSIS — N189 Chronic kidney disease, unspecified: Secondary | ICD-10-CM | POA: Diagnosis not present

## 2019-06-29 DIAGNOSIS — R739 Hyperglycemia, unspecified: Secondary | ICD-10-CM

## 2019-06-29 DIAGNOSIS — R410 Disorientation, unspecified: Secondary | ICD-10-CM

## 2019-06-29 DIAGNOSIS — C7951 Secondary malignant neoplasm of bone: Secondary | ICD-10-CM | POA: Insufficient documentation

## 2019-06-29 DIAGNOSIS — Z5111 Encounter for antineoplastic chemotherapy: Secondary | ICD-10-CM | POA: Diagnosis not present

## 2019-06-29 DIAGNOSIS — Z95828 Presence of other vascular implants and grafts: Secondary | ICD-10-CM

## 2019-06-29 DIAGNOSIS — Z79899 Other long term (current) drug therapy: Secondary | ICD-10-CM

## 2019-06-29 DIAGNOSIS — C7931 Secondary malignant neoplasm of brain: Secondary | ICD-10-CM | POA: Insufficient documentation

## 2019-06-29 DIAGNOSIS — C3490 Malignant neoplasm of unspecified part of unspecified bronchus or lung: Secondary | ICD-10-CM

## 2019-06-29 DIAGNOSIS — C3412 Malignant neoplasm of upper lobe, left bronchus or lung: Secondary | ICD-10-CM | POA: Diagnosis not present

## 2019-06-29 DIAGNOSIS — Z923 Personal history of irradiation: Secondary | ICD-10-CM | POA: Insufficient documentation

## 2019-06-29 DIAGNOSIS — G893 Neoplasm related pain (acute) (chronic): Secondary | ICD-10-CM | POA: Diagnosis not present

## 2019-06-29 DIAGNOSIS — D701 Agranulocytosis secondary to cancer chemotherapy: Secondary | ICD-10-CM | POA: Diagnosis not present

## 2019-06-29 DIAGNOSIS — I130 Hypertensive heart and chronic kidney disease with heart failure and stage 1 through stage 4 chronic kidney disease, or unspecified chronic kidney disease: Secondary | ICD-10-CM | POA: Diagnosis not present

## 2019-06-29 DIAGNOSIS — I4891 Unspecified atrial fibrillation: Secondary | ICD-10-CM | POA: Diagnosis not present

## 2019-06-29 DIAGNOSIS — Z01810 Encounter for preprocedural cardiovascular examination: Secondary | ICD-10-CM | POA: Diagnosis not present

## 2019-06-29 DIAGNOSIS — Z794 Long term (current) use of insulin: Secondary | ICD-10-CM | POA: Diagnosis not present

## 2019-06-29 DIAGNOSIS — E1165 Type 2 diabetes mellitus with hyperglycemia: Secondary | ICD-10-CM

## 2019-06-29 DIAGNOSIS — T451X5A Adverse effect of antineoplastic and immunosuppressive drugs, initial encounter: Secondary | ICD-10-CM | POA: Diagnosis not present

## 2019-06-29 DIAGNOSIS — E1122 Type 2 diabetes mellitus with diabetic chronic kidney disease: Secondary | ICD-10-CM | POA: Diagnosis not present

## 2019-06-29 LAB — BASIC METABOLIC PANEL
Anion gap: 12 (ref 5–15)
BUN: 16 mg/dL (ref 8–23)
CO2: 23 mmol/L (ref 22–32)
Calcium: 6.7 mg/dL — ABNORMAL LOW (ref 8.9–10.3)
Chloride: 97 mmol/L — ABNORMAL LOW (ref 98–111)
Creatinine, Ser: 0.5 mg/dL (ref 0.44–1.00)
GFR calc Af Amer: >60 mL/min (ref >60)
GFR calc non Af Amer: >60 mL/min (ref >60)
Glucose, Bld: 275 mg/dL — ABNORMAL HIGH (ref 70–99)
Potassium: 3.7 mmol/L (ref 3.5–5.1)
Sodium: 132 mmol/L — ABNORMAL LOW (ref 135–145)

## 2019-06-29 MED ORDER — SODIUM CHLORIDE 0.9% FLUSH
10.0000 mL | Freq: Once | INTRAVENOUS | Status: AC
  Administered 2019-06-29: 10 mL via INTRAVENOUS
  Filled 2019-06-29: qty 10

## 2019-06-29 MED ORDER — BD PEN NEEDLE NANO 2ND GEN 32G X 4 MM MISC: 0 refills | Status: AC

## 2019-06-29 MED ORDER — SODIUM CHLORIDE 0.9 % IV SOLN
1.0000 g | Freq: Once | INTRAVENOUS | Status: DC
  Filled 2019-06-29: qty 10

## 2019-06-29 MED ORDER — HEPARIN SOD (PORK) LOCK FLUSH 100 UNIT/ML IV SOLN
500.0000 [IU] | Freq: Once | INTRAVENOUS | Status: AC
  Administered 2019-06-29: 500 [IU] via INTRAVENOUS
  Filled 2019-06-29: qty 5

## 2019-06-29 MED ORDER — SODIUM CHLORIDE 0.9 % IV SOLN
Freq: Once | INTRAVENOUS | Status: AC
  Filled 2019-06-29: qty 250

## 2019-06-29 MED ORDER — SODIUM CHLORIDE 0.9 % IV SOLN
INTRAVENOUS | Status: DC
  Filled 2019-06-29 (×2): qty 100

## 2019-06-29 MED ORDER — SODIUM CHLORIDE 0.9 % IV SOLN
Freq: Once | INTRAVENOUS | Status: DC
  Filled 2019-06-29: qty 1000

## 2019-06-29 MED ORDER — OSIMERTINIB MESYLATE 80 MG PO TABS: 80.0000 mg | ORAL_TABLET | Freq: Every day | ORAL | 1 refills | Status: DC

## 2019-06-29 NOTE — Progress Notes (Signed)
ek 

## 2019-06-29 NOTE — Telephone Encounter (Signed)
Patient in hospital.

## 2019-06-29 NOTE — Progress Notes (Signed)
Radiation Oncology Follow up Note  Name: Megan Shelton   Date:   06/29/2019 MRN:  [id number] DOB: 01-05-1941    This 79 y.o. female presents to the clinic today for 1 month follow-up status post multiple areas of palliative radiation therapy to both her humerus hip and brain.  In patient with known stage IV lung cancer  REFERRING PROVIDER: Mar Daring, Mamie Nick*  HPI: Patient is a 79 year old female with stage IV lung cancer having previously received palliative radiation therapy to both her humerus hip and brain for stage IV disease..  Should she was hospitalized for altered mental status which I thought was due to edema of the brain rather than progressive disease.  She also recent presented with hyperglycemia most likely secondary to Decadron is been put on a sliding scale of insulin.  I believe she is startingbevacizumab with possible of Tagrisso under Dr. Elroy Channel direction.  She is seen today she states the arm hip are doing well with no significant pain.  She also states she is having no headaches and her mental status has improved she is accompanied by her daughter today.  COMPLICATIONS OF TREATMENT: none  FOLLOW UP COMPLIANCE: keeps appointments   PHYSICAL EXAM:  BP (!) 145/78   Pulse 66   Temp (!) 97.4 F (36.3 C)   Resp 20  Wheelchair-bound female in NAD.  Well-developed well-nourished patient in NAD. HEENT reveals PERLA, EOMI, discs not visualized.  Oral cavity is clear. No oral mucosal lesions are identified. Neck is clear without evidence of cervical or supraclavicular adenopathy. Lungs are clear to A&P. Cardiac examination is essentially unremarkable with regular rate and rhythm without murmur rub or thrill. Abdomen is benign with no organomegaly or masses noted. Motor sensory and DTR levels are equal and symmetric in the upper and lower extremities. Cranial nerves II through XII are grossly intact. Proprioception is intact. No peripheral adenopathy or edema is identified. No  motor or sensory levels are noted. Crude visual fields are within normal range.  RADIOLOGY RESULTS: No current films to review  PLAN: Present time will turn follow-up care over to medical oncology.  I would be happy to reevaluate the patient anytime should further palliative treatment be indicated.  Both patient and daughter comprehend my recommendations well.  I would like to take this opportunity to thank you for allowing me to participate in the care of your patient.Noreene Filbert, MD

## 2019-06-30 DIAGNOSIS — I2510 Atherosclerotic heart disease of native coronary artery without angina pectoris: Secondary | ICD-10-CM | POA: Diagnosis not present

## 2019-06-30 DIAGNOSIS — D6959 Other secondary thrombocytopenia: Secondary | ICD-10-CM | POA: Diagnosis not present

## 2019-06-30 DIAGNOSIS — C3492 Malignant neoplasm of unspecified part of left bronchus or lung: Secondary | ICD-10-CM | POA: Diagnosis not present

## 2019-06-30 DIAGNOSIS — C7931 Secondary malignant neoplasm of brain: Secondary | ICD-10-CM | POA: Diagnosis not present

## 2019-06-30 DIAGNOSIS — D61810 Antineoplastic chemotherapy induced pancytopenia: Secondary | ICD-10-CM | POA: Diagnosis not present

## 2019-06-30 DIAGNOSIS — I252 Old myocardial infarction: Secondary | ICD-10-CM | POA: Diagnosis not present

## 2019-06-30 DIAGNOSIS — G9341 Metabolic encephalopathy: Secondary | ICD-10-CM | POA: Diagnosis not present

## 2019-06-30 DIAGNOSIS — C7951 Secondary malignant neoplasm of bone: Secondary | ICD-10-CM | POA: Diagnosis not present

## 2019-06-30 DIAGNOSIS — I4891 Unspecified atrial fibrillation: Secondary | ICD-10-CM | POA: Diagnosis not present

## 2019-06-30 DIAGNOSIS — T451X5S Adverse effect of antineoplastic and immunosuppressive drugs, sequela: Secondary | ICD-10-CM | POA: Diagnosis not present

## 2019-06-30 DIAGNOSIS — I110 Hypertensive heart disease with heart failure: Secondary | ICD-10-CM | POA: Diagnosis not present

## 2019-06-30 DIAGNOSIS — I5022 Chronic systolic (congestive) heart failure: Secondary | ICD-10-CM | POA: Diagnosis not present

## 2019-06-30 DIAGNOSIS — I255 Ischemic cardiomyopathy: Secondary | ICD-10-CM | POA: Diagnosis not present

## 2019-06-30 NOTE — Progress Notes (Signed)
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 She was also a chronic smoker but quit smoking in 2006. More recently patient presented with pain in her left arm which led to an x-ray. X-ray showed comminuted possibly pathologic midshaft humeral fracture. This was followed by a whole-body bone scan which showed multiple sites of abnormal tracer uptake involving left scapula, bilateral posterior ribs, thoracic and lumbar spine, pelvis and multiple sites in the proximal and distal left femur as well as left humeral diaphysis.  PET CT scan shows 4 cm left retrohilar mass in the upper lobe but invading across the major fissure into the left lower lobe with an SUV of 9.1. Scattered osseous metastasis with humeral pathological fracture.  Mri brain showed brain mets  She completed palliative WBRT and palliative RT to left arm and right hip  Acetabular biopsy showed adenocarcinoma. NGSpanel which came back after 1 cycle of chemotherapy showedEGFR C2573T >G (X381W). PDL1 2%    Interval history-patient blood sugars have been mostly running in the 200s to 300s at home.  Mental status is not back to baseline yet.  She is alert and understands some degree of conversation but is not able  to carry out in-depth conversations.  Home physical therapy will be starting soon.  She is able to void on her own.  ECOG PS- 2 Pain scale- 0  Review of systems- Review of Systems  Constitutional: Positive for malaise/fatigue. Negative for chills, fever and weight loss.  HENT: Negative for congestion, ear discharge and nosebleeds.   Eyes: Negative for blurred vision.  Respiratory: Negative for cough, hemoptysis, sputum production, shortness of breath and wheezing.   Cardiovascular: Negative for chest pain, palpitations, orthopnea and claudication.  Gastrointestinal: Negative for abdominal pain, blood in stool, constipation, diarrhea, heartburn, melena, nausea and vomiting.  Genitourinary: Negative for dysuria, flank pain, frequency, hematuria and urgency.  Musculoskeletal: Negative for back pain, joint pain and myalgias.  Skin: Negative for rash.  Neurological: Negative for dizziness, tingling, focal weakness, seizures, weakness and headaches.  Endo/Heme/Allergies: Does not bruise/bleed easily.  Psychiatric/Behavioral: Negative for depression and suicidal ideas. The patient does not have insomnia.        Allergies  Allergen Reactions   Latex Rash   Nickel Rash     Past Medical History:  Diagnosis Date   AICD (automatic cardioverter/defibrillator) present    Anxiety    CHF (congestive heart failure) (HCC)    Diabetes mellitus with neuropathy ([place])    Dyspnea    Hypertension    Hypertensive heart disease    Implantable cardioverter-defibrillator (ICD)-[employer]    Myocardial infarction, anterior wall (HCC)    Obesity, Class II, BMI 35-39.9      Past Surgical History:  Procedure Laterality Date   CARDIOVERSION N/A 05/12/2019   Procedure: CARDIOVERSION;  Surgeon: Nelva Bush, MD;  Location: [facility] ORS;  Service: Cardiovascular;  Laterality: N/A;   CARPAL TUNNEL RELEASE     cataract surgery     EYE SURGERY     FRACTURE SURGERY Left    ARM   JOINT  REPLACEMENT Left    tkr   PORTA CATH INSERTION N/A 05/14/2019   Procedure: PORTA CATH INSERTION;  Surgeon: Algernon Huxley, MD;  Location: [place] [facility];  Service: Cardiovascular;  Laterality: N/A;   VIDEO BRONCHOSCOPY WITH ENDOBRONCHIAL ULTRASOUND N/A 05/11/2019   Procedure: VIDEO BRONCHOSCOPY WITH ENDOBRONCHIAL ULTRASOUND;  Surgeon: Tyler Pita, MD;  Location: [facility] ORS;  Service: Pulmonary;  Laterality: N/A;    Social History   Socioeconomic History   Marital status: Widowed    Spouse name: Not on file   Number of children: 3   Years of education: Not on file   Highest education level: High school graduate  Occupational History   Occupation: retired  Tobacco Use   Smoking status: Former Smoker    Packs/day: 1.00    Years: 55.00    Pack years: 55.00    Types: Cigarettes    Quit date: 2006    Years since quitting: 15.2   Smokeless tobacco: Never Used   Tobacco comment: quit about 18 years ago; as of 2019  Substance and Sexual Activity   Alcohol use: No   Drug use: No   Sexual activity: Not on file  Other Topics Concern   Not on file  Social History Narrative   Not on file   Social Determinants of Health   Financial Resource Strain:    Difficulty of Paying Living Expenses:   Food Insecurity:    Worried About Charity fundraiser in the Last Year:    Arboriculturist in the Last Year:   Transportation Needs:    Film/video editor (Medical):    Lack of Transportation (Non-Medical):   Physical Activity: Inactive   Days of Exercise per Week: 0 days   Minutes of Exercise per Session: 0 min  Stress: No Stress Concern Present   Feeling of Stress : Not at all  Social Connections: Unknown   Frequency of Communication with Friends and Family: Patient refused   Frequency of Social Gatherings with Friends and Family: Patient refused   Attends Religious Services: Patient refused   Marine scientist or Organizations: Patient refused     Attends Music therapist: Patient refused   Marital Status: Patient refused  Intimate Production manager Violence: Unknown   Fear of Current or Ex-Partner: Patient refused   Emotionally Abused: Patient refused   Physically Abused: Patient refused   Sexually Abused: Patient refused    Family History  Problem Relation Age of Onset   Scleroderma Mother    Raynaud syndrome Mother    Cancer Father 61       colon cancer   Heart disease Brother        died of heart attack   Diabetes Brother      Current Outpatient Medications:    albuterol (VENTOLIN HFA) 108 (90 Base) MCG/ACT inhaler, Inhale 1 puff into the lungs every 6 (six) hours as needed for wheezing or shortness of breath., Disp: 18 g, Rfl: 8   apixaban (ELIQUIS) 5 MG TABS tablet, Take 1 tablet (5 mg total) by mouth 2 (two) times daily., Disp: 180 tablet, Rfl: 1   atorvastatin (LIPITOR) 40 MG tablet, TAKE 1 TABLET BY MOUTH  DAILY (Patient taking differently: Take 40 mg by mouth daily. ), Disp: 90  tablet, Rfl: 3   clonazePAM (KLONOPIN) 1 MG tablet, TAKE 1 TABLET BY MOUTH THREE TIMES A DAY AS NEEDED FOR ANXIETY, Disp: 90 tablet, Rfl: 5   furosemide (LASIX) 40 MG tablet, Take 1 tablet (40 mg total) by mouth daily as needed for fluid. (Patient not taking: Reported on 06/26/2019), Disp: 90 tablet, Rfl: 1   glimepiride (AMARYL) 2 MG tablet, Take 1 tablet (2 mg total) by mouth 2 (two) times daily. (Patient taking differently: Take 2 mg by mouth 2 (two) times daily. ), Disp: 180 tablet, Rfl: 1   Insulin Pen Needle (BD PEN NEEDLE NANO 2ND GEN) 32G X 4 MM MISC, To use with humalog injection ACHS, Disp: 100 each, Rfl: 0   levothyroxine (SYNTHROID) 100 MCG tablet, Take 1 tablet (100 mcg total) by mouth daily at 6 (six) AM., Disp: 90 tablet, Rfl: 1   lisinopril (ZESTRIL) 10 MG tablet, TAKE 1 TABLET BY MOUTH  DAILY (Patient taking differently: Take 10 mg by mouth daily. ), Disp: 90 tablet, Rfl: 3   magic mouthwash w/lidocaine  SOLN, Take 5 mLs by mouth 4 (four) times daily., Disp: 240 mL, Rfl: 0   metFORMIN (GLUCOPHAGE-XR) 500 MG 24 hr tablet, Take 1 tablet (500 mg total) by mouth 2 (two) times daily., Disp: 180 tablet, Rfl: 1   metoprolol tartrate (LOPRESSOR) 50 MG tablet, Take 1 tablet (50 mg total) by mouth 2 (two) times daily., Disp: 180 tablet, Rfl: 1   osimertinib mesylate (TAGRISSO) 80 MG tablet, Take 1 tablet (80 mg total) by mouth daily., Disp: 30 tablet, Rfl: 1   oxyCODONE (OXY IR/ROXICODONE) 5 MG immediate release tablet, Take 1 tablet (5 mg total) by mouth every 4 (four) hours as needed for severe pain. (Patient not taking: Reported on 06/26/2019), Disp: 60 tablet, Rfl: 0   pantoprazole (PROTONIX) 40 MG tablet, Take 1 tablet (40 mg total) by mouth daily., Disp: 30 tablet, Rfl: 0   sitaGLIPtin (JANUVIA) 100 MG tablet, Take 100 mg by mouth daily., Disp: , Rfl:   Physical exam:  Vitals:   06/29/19 1126  BP: (!) 145/78  Pulse: 66  Temp: (!) 97.4 F (36.3 C)  TempSrc: Tympanic   Physical Exam Constitutional:      Comments: She is sitting in a wheelchair and appears in no acute distress  HENT:     Head: Normocephalic and atraumatic.  Eyes:     Pupils: Pupils are equal, round, and reactive to light.  Cardiovascular:     Rate and Rhythm: Normal rate and regular rhythm.     Heart sounds: Normal heart sounds.  Pulmonary:     Effort: Pulmonary effort is normal.     Breath sounds: Normal breath sounds.  Abdominal:     General: Bowel sounds are normal.     Palpations: Abdomen is soft.  Musculoskeletal:     Cervical back: Normal range of motion.  Skin:    General: Skin is warm and dry.  Neurological:     Mental Status: She is alert and oriented to person, place, and time.      CMP Latest Ref Rng & Units 06/29/2019  Glucose 70 - 99 mg/dL 275(H)  BUN 8 - 23 mg/dL 16  Creatinine 0.44 - 1.00 mg/dL 0.50  Sodium 135 - 145 mmol/L 132(L)  Potassium 3.5 - 5.1 mmol/L 3.7  Chloride 98 - 111 mmol/L  97(L)  CO2 22 - 32 mmol/L 23  Calcium 8.9 - 10.3 mg/dL 6.7(L)  Total Protein 6.5 - 8.1 g/dL -  Total Bilirubin 0.3 - 1.2 mg/dL -  Alkaline Phos 38 - 126 U/L -  AST 15 - 41 U/L -  ALT 0 - 44 U/L -   CBC Latest Ref Rng & Units 06/27/2019  WBC 4.0 - 10.5 K/uL 14.7(H)  Hemoglobin 12.0 - 15.0 g/dL 13.8  Hematocrit 36.0 - 46.0 % 39.4  Platelets 150 - 400 K/uL 173    No images are attached to the encounter.  CT HEAD WO CONTRAST  Result Date: 06/15/2019 CLINICAL DATA:  Altered mental status. EXAM: CT HEAD WITHOUT CONTRAST TECHNIQUE: Contiguous axial images were obtained from the base of the skull through the vertex without intravenous contrast. COMPARISON:  May 08, 2019. FINDINGS: Brain: Increased left frontal lobe low density with sulcal effacement is noted concerning for infarction or edema secondary to underlying metastatic lesion. 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 Ventricular size is within normal limits. No definite evidence of hemorrhage is noted. No midline shift is noted. Vascular: No hyperdense vessel or unexpected calcification. Skull: Normal. Negative for fracture or focal lesion. Sinuses/Orbits: Bilateral ethmoid and sphenoid sinusitis. Other: None. IMPRESSION: Increased left frontal lobe low density with sulcal effacement is noted concerning for infarction or edema secondary to underlying metastatic lesion. MRI with and without gadolinium is recommended for further evaluation. Electronically Signed   By: Marijo Conception M.D.   On: 06/15/2019 16:57   CT HEAD W CONTRAST  Result Date: 06/15/2019 CLINICAL DATA:  Lung cancer with abnormal noncontrast CT EXAM: CT HEAD WITH CONTRAST TECHNIQUE: Contiguous axial images were obtained from the base of the skull through the vertex with intravenous contrast. CONTRAST:  23m OMNIPAQUE IOHEXOL 300 MG/ML  SOLN COMPARISON:  Earlier same day, contrast CT 05/08/2019 FINDINGS: Brain: Increase in size of left frontal parenchymal lesion now measuring 14  x 8 mm (previously 11 x 8 mm). Surrounding edema has also increased. Additional more apparent but similar size 7 mm right frontal lesion (series 4, image 15). Possible additional subcentimeter right parietal lesions (series 2, image 22 and series 4, image 9); the latter was present on the prior study and is unchanged. Chronic left parietal infarction. No significant mass effect. No hydrocephalus. Vascular: Unremarkable. Skull: No new findings. Sinuses/Orbits: No acute finding. Other: None. IMPRESSION: Increase in size of left frontal metastasis and associated edema. Additional three subcentimeter lesions as described. Electronically Signed   By: PMacy MisM.D.   On: 06/15/2019 20:27   DG Chest Portable 1 View  Result Date: 06/15/2019 CLINICAL DATA:  Fever, metastatic lung cancer EXAM: PORTABLE CHEST 1 VIEW COMPARISON:  None. FINDINGS: Left perihilar density likely reflecting partially visualized mass. There is mild elevation of the left hemidiaphragm. Mild nonspecific interstitial prominence. Heart size is likely within normal limits for portable technique. Left chest wall ICD is present. Right chest wall port catheter tip overlies cavoatrial junction. IMPRESSION: Left perihilar density likely reflecting partially visualized mass. Mild elevation of the left hemidiaphragm. Mild bibasilar atelectasis and nonspecific interstitial prominence. Electronically Signed   By: PMacy MisM.D.   On: 06/15/2019 18:42   CUP PACEART REMOTE DEVICE CHECK  Result Date: 06/22/2019 Scheduled remote reviewed. OptiVol alert triggered 06/21/19; TI below baseline since last interrogation on 06/08/19. No shocks or detects. Next remote 91 days. LFelisa Bonier RN, MSN Scheduled remote reviewed. OptiVol alert triggered 06/21/19; TI below baseline since last interrogation on 06/08/19. No shocks or detects. Next remote 91 days. LFelisa Bonier RN, MSN    Assessment and plan- Patient is a 79y.o.  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  She is here for routine follow-up of following issues:  1 brain metastases: She is s/p 1 dose of bevacizumab and will be due for her next dose in 10 days.  Given her hyperglycemia I will start a slow steroid taper.  I have asked her to cut down her Decadron to 4 mg twice daily and after 1 week she will cut it down further to 2 mg twice daily.  2.  Stage IV EGFR positive lung cancer: There have been studies looking into combination of Tagrisso and bevacizumab and the combination as tolerated well.  I would therefore like her to start taking Tagrisso 80 mg daily.  We did get a baseline EKG in the office Which shows a corrected QTc interval of 466 ms.  QTC can be prolonged in patients with Tagrisso.  Also her recent echocardiogram showed an EF of 45 to 50% which we will continue to monitor on a periodic basis while she is on Tagrisso.  Discussed risks and benefits of Tagrisso including all but not limited to Skin rash, diarrhea, electrolyte disturbances, abnormal LFTs as well as cytopenias.  In general Tagrisso was tolerated well and has been shown to have good outcomes and EGFR positive lung cancer.  She already has 1 month supply for Tagrisso and will start taking it at this time.  Treatment will be given with a palliative intent.  3.  Steroid-induced hyperglycemia: Plan as above to reduce the dose of Decadron  I will see her on 07/09/2019 for her next dose of bevacizumab   Visit Diagnosis 1. Hyperglycemia   2. High risk medication use   3. Steroid induced hyperglycemia   Dr. Randa Evens, MD, MPH [facility] [phone] 06/30/2019 1:16 PM

## 2019-07-01 DIAGNOSIS — I5022 Chronic systolic (congestive) heart failure: Secondary | ICD-10-CM | POA: Diagnosis not present

## 2019-07-01 DIAGNOSIS — I252 Old myocardial infarction: Secondary | ICD-10-CM | POA: Diagnosis not present

## 2019-07-01 DIAGNOSIS — I110 Hypertensive heart disease with heart failure: Secondary | ICD-10-CM | POA: Diagnosis not present

## 2019-07-01 DIAGNOSIS — I255 Ischemic cardiomyopathy: Secondary | ICD-10-CM | POA: Diagnosis not present

## 2019-07-01 DIAGNOSIS — I2510 Atherosclerotic heart disease of native coronary artery without angina pectoris: Secondary | ICD-10-CM | POA: Diagnosis not present

## 2019-07-01 DIAGNOSIS — T451X5S Adverse effect of antineoplastic and immunosuppressive drugs, sequela: Secondary | ICD-10-CM | POA: Diagnosis not present

## 2019-07-01 DIAGNOSIS — C7951 Secondary malignant neoplasm of bone: Secondary | ICD-10-CM | POA: Diagnosis not present

## 2019-07-01 DIAGNOSIS — I4891 Unspecified atrial fibrillation: Secondary | ICD-10-CM | POA: Diagnosis not present

## 2019-07-01 DIAGNOSIS — D61810 Antineoplastic chemotherapy induced pancytopenia: Secondary | ICD-10-CM | POA: Diagnosis not present

## 2019-07-01 DIAGNOSIS — D6959 Other secondary thrombocytopenia: Secondary | ICD-10-CM | POA: Diagnosis not present

## 2019-07-01 DIAGNOSIS — G9341 Metabolic encephalopathy: Secondary | ICD-10-CM | POA: Diagnosis not present

## 2019-07-01 DIAGNOSIS — C3492 Malignant neoplasm of unspecified part of left bronchus or lung: Secondary | ICD-10-CM | POA: Diagnosis not present

## 2019-07-01 DIAGNOSIS — C7931 Secondary malignant neoplasm of brain: Secondary | ICD-10-CM | POA: Diagnosis not present

## 2019-07-01 NOTE — Progress Notes (Signed)
Pharmacist Chemotherapy Monitoring - Follow Up Assessment    I verify that I have reviewed each item in the below checklist:  . Regimen for the patient is scheduled for the appropriate day and plan matches scheduled date. Marland Kitchen Appropriate non-routine labs are ordered dependent on drug ordered. . If applicable, additional medications reviewed and ordered per protocol based on lifetime cumulative doses and/or treatment regimen.   Plan for follow-up and/or issues identified: Yes . I-vent associated with next due treatment: Yes . MD and/or nursing notified: Yes  Adelina Mings 07/01/2019 1:52 PM

## 2019-07-02 DIAGNOSIS — G9341 Metabolic encephalopathy: Secondary | ICD-10-CM | POA: Diagnosis not present

## 2019-07-02 DIAGNOSIS — D61810 Antineoplastic chemotherapy induced pancytopenia: Secondary | ICD-10-CM | POA: Diagnosis not present

## 2019-07-02 DIAGNOSIS — I255 Ischemic cardiomyopathy: Secondary | ICD-10-CM | POA: Diagnosis not present

## 2019-07-02 DIAGNOSIS — I5022 Chronic systolic (congestive) heart failure: Secondary | ICD-10-CM | POA: Diagnosis not present

## 2019-07-02 DIAGNOSIS — D6959 Other secondary thrombocytopenia: Secondary | ICD-10-CM | POA: Diagnosis not present

## 2019-07-02 DIAGNOSIS — I4891 Unspecified atrial fibrillation: Secondary | ICD-10-CM | POA: Diagnosis not present

## 2019-07-02 DIAGNOSIS — I2510 Atherosclerotic heart disease of native coronary artery without angina pectoris: Secondary | ICD-10-CM | POA: Diagnosis not present

## 2019-07-02 DIAGNOSIS — C7951 Secondary malignant neoplasm of bone: Secondary | ICD-10-CM | POA: Diagnosis not present

## 2019-07-02 DIAGNOSIS — T451X5S Adverse effect of antineoplastic and immunosuppressive drugs, sequela: Secondary | ICD-10-CM | POA: Diagnosis not present

## 2019-07-02 DIAGNOSIS — I110 Hypertensive heart disease with heart failure: Secondary | ICD-10-CM | POA: Diagnosis not present

## 2019-07-02 DIAGNOSIS — I252 Old myocardial infarction: Secondary | ICD-10-CM | POA: Diagnosis not present

## 2019-07-02 DIAGNOSIS — C7931 Secondary malignant neoplasm of brain: Secondary | ICD-10-CM | POA: Diagnosis not present

## 2019-07-02 DIAGNOSIS — C3492 Malignant neoplasm of unspecified part of left bronchus or lung: Secondary | ICD-10-CM | POA: Diagnosis not present

## 2019-07-03 ENCOUNTER — Telehealth: Payer: Self-pay | Admitting: *Deleted

## 2019-07-03 ENCOUNTER — Encounter: Payer: Self-pay | Admitting: Oncology

## 2019-07-03 DIAGNOSIS — T451X5S Adverse effect of antineoplastic and immunosuppressive drugs, sequela: Secondary | ICD-10-CM | POA: Diagnosis not present

## 2019-07-03 DIAGNOSIS — I2510 Atherosclerotic heart disease of native coronary artery without angina pectoris: Secondary | ICD-10-CM | POA: Diagnosis not present

## 2019-07-03 DIAGNOSIS — D61810 Antineoplastic chemotherapy induced pancytopenia: Secondary | ICD-10-CM | POA: Diagnosis not present

## 2019-07-03 DIAGNOSIS — I110 Hypertensive heart disease with heart failure: Secondary | ICD-10-CM | POA: Diagnosis not present

## 2019-07-03 DIAGNOSIS — C3492 Malignant neoplasm of unspecified part of left bronchus or lung: Secondary | ICD-10-CM | POA: Diagnosis not present

## 2019-07-03 DIAGNOSIS — G9341 Metabolic encephalopathy: Secondary | ICD-10-CM | POA: Diagnosis not present

## 2019-07-03 DIAGNOSIS — D6959 Other secondary thrombocytopenia: Secondary | ICD-10-CM | POA: Diagnosis not present

## 2019-07-03 DIAGNOSIS — I255 Ischemic cardiomyopathy: Secondary | ICD-10-CM | POA: Diagnosis not present

## 2019-07-03 DIAGNOSIS — I5022 Chronic systolic (congestive) heart failure: Secondary | ICD-10-CM | POA: Diagnosis not present

## 2019-07-03 DIAGNOSIS — I252 Old myocardial infarction: Secondary | ICD-10-CM | POA: Diagnosis not present

## 2019-07-03 DIAGNOSIS — C7931 Secondary malignant neoplasm of brain: Secondary | ICD-10-CM | POA: Diagnosis not present

## 2019-07-03 DIAGNOSIS — C7951 Secondary malignant neoplasm of bone: Secondary | ICD-10-CM | POA: Diagnosis not present

## 2019-07-03 DIAGNOSIS — I4891 Unspecified atrial fibrillation: Secondary | ICD-10-CM | POA: Diagnosis not present

## 2019-07-03 NOTE — Telephone Encounter (Signed)
Yes please

## 2019-07-03 NOTE — Telephone Encounter (Signed)
From PEC: Other Megan Shelton (Patient) Quick Communication - Home Health Verbal Orders  Summary: Verbal Orders  Caller/Agency: [facility]  Callback Number: [phone]  Requesting OT/PT/Skilled Nursing/Social Work/Speech Therapy: Nursing  Frequency: Increase frequency to 3 week 3

## 2019-07-03 NOTE — Telephone Encounter (Signed)
Given

## 2019-07-06 ENCOUNTER — Other Ambulatory Visit: Payer: Self-pay | Admitting: *Deleted

## 2019-07-06 ENCOUNTER — Inpatient Hospital Stay: Payer: Medicare Other

## 2019-07-06 ENCOUNTER — Other Ambulatory Visit: Payer: Self-pay

## 2019-07-06 ENCOUNTER — Encounter: Payer: Self-pay | Admitting: Oncology

## 2019-07-06 ENCOUNTER — Inpatient Hospital Stay (HOSPITAL_BASED_OUTPATIENT_CLINIC_OR_DEPARTMENT_OTHER): Payer: Medicare Other | Admitting: Oncology

## 2019-07-06 VITALS — BP 134/77 | HR 81 | Temp 96.9°F | Resp 20

## 2019-07-06 DIAGNOSIS — C7931 Secondary malignant neoplasm of brain: Secondary | ICD-10-CM | POA: Diagnosis not present

## 2019-07-06 DIAGNOSIS — R197 Diarrhea, unspecified: Secondary | ICD-10-CM

## 2019-07-06 DIAGNOSIS — E1122 Type 2 diabetes mellitus with diabetic chronic kidney disease: Secondary | ICD-10-CM | POA: Diagnosis not present

## 2019-07-06 DIAGNOSIS — D701 Agranulocytosis secondary to cancer chemotherapy: Secondary | ICD-10-CM | POA: Diagnosis not present

## 2019-07-06 DIAGNOSIS — Z794 Long term (current) use of insulin: Secondary | ICD-10-CM | POA: Diagnosis not present

## 2019-07-06 DIAGNOSIS — Z5111 Encounter for antineoplastic chemotherapy: Secondary | ICD-10-CM | POA: Diagnosis not present

## 2019-07-06 DIAGNOSIS — C3490 Malignant neoplasm of unspecified part of unspecified bronchus or lung: Secondary | ICD-10-CM | POA: Diagnosis not present

## 2019-07-06 DIAGNOSIS — E860 Dehydration: Secondary | ICD-10-CM

## 2019-07-06 DIAGNOSIS — Z79899 Other long term (current) drug therapy: Secondary | ICD-10-CM | POA: Diagnosis not present

## 2019-07-06 DIAGNOSIS — I130 Hypertensive heart and chronic kidney disease with heart failure and stage 1 through stage 4 chronic kidney disease, or unspecified chronic kidney disease: Secondary | ICD-10-CM | POA: Diagnosis not present

## 2019-07-06 DIAGNOSIS — T451X5A Adverse effect of antineoplastic and immunosuppressive drugs, initial encounter: Secondary | ICD-10-CM | POA: Diagnosis not present

## 2019-07-06 DIAGNOSIS — G893 Neoplasm related pain (acute) (chronic): Secondary | ICD-10-CM | POA: Diagnosis not present

## 2019-07-06 DIAGNOSIS — Z95828 Presence of other vascular implants and grafts: Secondary | ICD-10-CM

## 2019-07-06 DIAGNOSIS — I4891 Unspecified atrial fibrillation: Secondary | ICD-10-CM | POA: Diagnosis not present

## 2019-07-06 DIAGNOSIS — N189 Chronic kidney disease, unspecified: Secondary | ICD-10-CM | POA: Diagnosis not present

## 2019-07-06 DIAGNOSIS — C7951 Secondary malignant neoplasm of bone: Secondary | ICD-10-CM | POA: Diagnosis not present

## 2019-07-06 LAB — CBC WITH DIFFERENTIAL/PLATELET
Abs Immature Granulocytes: 0.27 10*3/uL — ABNORMAL HIGH (ref 0.00–0.07)
Basophils Absolute: 0 10*3/uL (ref 0.0–0.1)
Basophils Relative: 0 %
Eosinophils Absolute: 0 10*3/uL (ref 0.0–0.5)
Eosinophils Relative: 0 %
HCT: 42 % (ref 36.0–46.0)
Hemoglobin: 14.2 g/dL (ref 12.0–15.0)
Immature Granulocytes: 2 %
Lymphocytes Relative: 1 %
Lymphs Abs: 0.2 10*3/uL — ABNORMAL LOW (ref 0.7–4.0)
MCH: 28.1 pg (ref 26.0–34.0)
MCHC: 33.8 g/dL (ref 30.0–36.0)
MCV: 83 fL (ref 80.0–100.0)
Monocytes Absolute: 0.5 10*3/uL (ref 0.1–1.0)
Monocytes Relative: 4 %
Neutro Abs: 11.6 10*3/uL — ABNORMAL HIGH (ref 1.7–7.7)
Neutrophils Relative %: 93 %
Platelets: 71 10*3/uL — ABNORMAL LOW (ref 150–400)
RBC: 5.06 MIL/uL (ref 3.87–5.11)
RDW: 19.7 % — ABNORMAL HIGH (ref 11.5–15.5)
WBC: 12.6 10*3/uL — ABNORMAL HIGH (ref 4.0–10.5)
nRBC: 0.4 % — ABNORMAL HIGH (ref 0.0–0.2)

## 2019-07-06 LAB — COMPREHENSIVE METABOLIC PANEL
ALT: 26 U/L (ref 0–44)
AST: 25 U/L (ref 15–41)
Albumin: 3.2 g/dL — ABNORMAL LOW (ref 3.5–5.0)
Alkaline Phosphatase: 184 U/L — ABNORMAL HIGH (ref 38–126)
Anion gap: 10 (ref 5–15)
BUN: 18 mg/dL (ref 8–23)
CO2: 23 mmol/L (ref 22–32)
Calcium: 7.1 mg/dL — ABNORMAL LOW (ref 8.9–10.3)
Chloride: 100 mmol/L (ref 98–111)
Creatinine, Ser: 0.39 mg/dL — ABNORMAL LOW (ref 0.44–1.00)
GFR calc Af Amer: >60 mL/min (ref >60)
GFR calc non Af Amer: >60 mL/min (ref >60)
Glucose, Bld: 120 mg/dL — ABNORMAL HIGH (ref 70–99)
Potassium: 3.3 mmol/L — ABNORMAL LOW (ref 3.5–5.1)
Sodium: 133 mmol/L — ABNORMAL LOW (ref 135–145)
Total Bilirubin: 0.8 mg/dL (ref 0.3–1.2)
Total Protein: 6.1 g/dL — ABNORMAL LOW (ref 6.5–8.1)

## 2019-07-06 LAB — C DIFFICILE QUICK SCREEN W PCR REFLEX
C Diff antigen: NEGATIVE
C Diff interpretation: NOT DETECTED
C Diff toxin: NEGATIVE

## 2019-07-06 LAB — GASTROINTESTINAL PANEL BY PCR, STOOL (REPLACES STOOL CULTURE)

## 2019-07-06 LAB — MAGNESIUM: Magnesium: 1.9 mg/dL (ref 1.7–2.4)

## 2019-07-06 MED ORDER — SODIUM CHLORIDE 0.9% FLUSH
10.0000 mL | INTRAVENOUS | Status: DC | PRN
  Administered 2019-07-06: 10 mL via INTRAVENOUS
  Filled 2019-07-06: qty 10

## 2019-07-06 MED ORDER — SODIUM CHLORIDE 0.9 % IV SOLN
Freq: Once | INTRAVENOUS | Status: AC
  Filled 2019-07-06: qty 250

## 2019-07-06 MED ORDER — HEPARIN SOD (PORK) LOCK FLUSH 100 UNIT/ML IV SOLN
500.0000 [IU] | Freq: Once | INTRAVENOUS | Status: AC
  Administered 2019-07-06: 12:00:00 500 [IU] via INTRAVENOUS
  Filled 2019-07-06: qty 5

## 2019-07-06 MED ORDER — POTASSIUM CHLORIDE CRYS ER 20 MEQ PO TBCR: 20.0000 meq | EXTENDED_RELEASE_TABLET | Freq: Once | ORAL | 0 refills | Status: AC

## 2019-07-06 NOTE — Progress Notes (Signed)
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  Chief complaint/ Reason for visit-dehydration/diarrhea  Heme/Onc history:  Oncology History  Stage 4 malignant neoplasm of lung ([place])  05/11/2019 Initial Diagnosis   Stage 4 malignant neoplasm of lung ([place])   06/04/2019 - 06/24/2019 Chemotherapy   The patient had palonosetron (ALOXI) injection 0.25 mg, 0.25 mg, Intravenous,  Once, 1 of 4 cycles Administration: 0.25 mg (06/04/2019) PEMEtrexed (ALIMTA) 900 mg in sodium chloride 0.9 % 100 mL chemo infusion, 500 mg/m2 = 900 mg, Intravenous,  Once, 1 of 6 cycles Administration: 900 mg (06/04/2019) CARBOplatin (PARAPLATIN) 320 mg in sodium chloride 0.9 % 250 mL chemo infusion, 320 mg (100 % of original dose 318.8 mg), Intravenous,  Once, 1 of 4 cycles Dose modification:   (original dose 318.8 mg, Cycle 1),   (original dose 318.8 mg, Cycle 3) Administration: 320 mg (06/04/2019) fosaprepitant (EMEND) 150 mg in sodium chloride 0.9 % 145 mL IVPB, 150 mg, Intravenous,  Once, 1 of 4 cycles Administration: 150 mg (06/04/2019) pembrolizumab (KEYTRUDA) 200 mg in sodium chloride 0.9 % 50 mL chemo infusion, 200 mg, Intravenous, Once, 1 of 6 cycles Administration: 200 mg (06/04/2019)  for chemotherapy treatment.    Brain metastases ([place])  05/13/2019 Initial Diagnosis   Brain metastases ([place])   06/25/2019 -  Chemotherapy   The patient had bevacizumab-awwb (MVASI) 700 mg in sodium chloride 0.9 % 100 mL chemo infusion, 675 mg (100 % of original dose 10 mg/kg), Intravenous,  Once, 1 of 1 cycle Dose modification: 10 mg/kg  (original dose 10 mg/kg, Cycle 1, Reason: Other (see comments), Comment: availability) Administration: 700 mg (06/25/2019) bevacizumab-bvzr (ZIRABEV) 700 mg in sodium chloride 0.9 % 100 mL chemo infusion, 10 mg/kg = 700 mg, Intravenous,  Once, 0 of 3 cycles  for chemotherapy treatment.     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   She completed radiation to her humerus hip and brain approximately 1 month ago.  She was hospitalized for altered mental status thought to be due to edema of the brain rather than progression of disease.  She also recently presented for hyperglycemia most likely secondary to daily steroids.  She was placed on a sliding scale insulin with some improvement.  She was noted to have hypoglycemia last night and instructed to discontinue her insulin and recheck blood sugars in the morning and throughout the day.  If noted to continue to be low she was asked to decrease her oral diabetes medication.  She has completed 1 cycle of carbo/Alimta plus Keytruda back in February 2021 which was discontinued early secondary to confusion and AMS resulting in a hospitalization.  She was started on bevacizumab with Tagrisso 80 mg with last dose given on 06/25/2019.   Today patient presents with her son.  Son states she developed diarrhea yesterday.  She has had approximately 4 episodes of watery diarrhea.  She denies incontinence.  She denies foul-smelling stool.  Recently started Tagrisso 80 mg daily.  Son states the medication was started in the past week but he is unclear of actual start date.  Denies any abdominal pain.  Has confusion but this is baseline.  Denies any chest pain or shortness of breath. Denies  any urinary concerns. She denies dizziness when standing. Feels overall weak and has trouble ambulating without full assistance. She has been eating well and feels that she has been drinking plenty of fluids.   ECOG FS:3 - Symptomatic, >50% confined to bed  Review of systems- Review of Systems  Constitutional: Positive for malaise/fatigue. Negative for chills, fever and weight loss.  HENT: Negative for congestion, ear pain and tinnitus.   Eyes: Negative.  Negative for blurred vision and double vision.  Respiratory: Negative.  Negative for cough, sputum production and shortness of breath.   Cardiovascular: Negative.  Negative for chest pain, palpitations and leg swelling.  Gastrointestinal: Positive for diarrhea. Negative for abdominal pain, constipation, nausea and vomiting.  Genitourinary: Negative for dysuria, frequency and urgency.  Musculoskeletal: Negative for back pain and falls.  Skin: Negative.  Negative for rash.  Neurological: Positive for weakness. Negative for headaches.  Endo/Heme/Allergies: Negative.  Does not bruise/bleed easily.  Psychiatric/Behavioral: Negative.  Negative for depression. The patient is not nervous/anxious and does not have insomnia.        Confusion     Current treatment- Tagrisso and bevacizumab  Allergies  Allergen Reactions  . Latex Rash  . Nickel Rash     Past Medical History:  Diagnosis Date  . AICD (automatic cardioverter/defibrillator) present   . Anxiety   . CHF (congestive heart failure) ([place])   . Diabetes mellitus with neuropathy ([place])   . Dyspnea   . Hypertension   . Hypertensive heart disease   . Implantable cardioverter-defibrillator (ICD)-[employer]   . Myocardial infarction, anterior wall ([place])   . Obesity, Class II, BMI 35-39.9      Past Surgical History:  Procedure Laterality Date  . CARDIOVERSION N/A 05/12/2019   Procedure: CARDIOVERSION;  Surgeon: Nelva Bush, MD;  Location: [facility] ORS;  Service: Cardiovascular;   Laterality: N/A;  . CARPAL TUNNEL RELEASE    . cataract surgery    . EYE SURGERY    . FRACTURE SURGERY Left    ARM  . JOINT REPLACEMENT Left    tkr  . PORTA CATH INSERTION N/A 05/14/2019   Procedure: PORTA CATH INSERTION;  Surgeon: Algernon Huxley, MD;  Location: [place] [facility];  Service: Cardiovascular;  Laterality: N/A;  . VIDEO BRONCHOSCOPY WITH ENDOBRONCHIAL ULTRASOUND N/A 05/11/2019   Procedure: VIDEO BRONCHOSCOPY WITH ENDOBRONCHIAL ULTRASOUND;  Surgeon: Tyler Pita, MD;  Location: [facility] ORS;  Service: Pulmonary;  Laterality: N/A;    Social History   Socioeconomic History  . Marital status: Widowed    Spouse name: Not on file  . Number of children: 3  . Years of education: Not on file  . Highest education level: High school graduate  Occupational History  . Occupation: retired  Tobacco Use  . Smoking status: Former Smoker    Packs/day: 1.00    Years: 55.00    Pack years: 55.00    Types: Cigarettes    Quit date: 2006    Years since quitting: 15.2  . Smokeless tobacco: Never Used  . Tobacco comment: quit about 18 years ago; as of 2019  Substance and Sexual Activity  . Alcohol use: No  . Drug  use: No  . Sexual activity: Not on file  Other Topics Concern  . Not on file  Social History Narrative  . Not on file   Social Determinants of Health   Financial Resource Strain:   . Difficulty of Paying Living Expenses:   Food Insecurity:   . Worried About Charity fundraiser in the Last Year:   . Arboriculturist in the Last Year:   Transportation Needs:   . Film/video editor (Medical):   Marland Kitchen Lack of Transportation (Non-Medical):   Physical Activity: Inactive  . Days of Exercise per Week: 0 days  . Minutes of Exercise per Session: 0 min  Stress: No Stress Concern Present  . Feeling of Stress : Not at all  Social Connections: Unknown  . Frequency of Communication with Friends and Family: Patient refused  . Frequency of Social Gatherings with Friends and  Family: Patient refused  . Attends Religious Services: Patient refused  . Active Member of Clubs or Organizations: Patient refused  . Attends Archivist Meetings: Patient refused  . Marital Status: Patient refused  Intimate Partner Violence: Unknown  . Fear of Current or Ex-Partner: Patient refused  . Emotionally Abused: Patient refused  . Physically Abused: Patient refused  . Sexually Abused: Patient refused    Family History  Problem Relation Age of Onset  . Scleroderma Mother   . Raynaud syndrome Mother   . Cancer Father 45       colon cancer  . Heart disease Brother        died of heart attack  . Diabetes Brother      Current Outpatient Medications:  .  albuterol (VENTOLIN HFA) 108 (90 Base) MCG/ACT inhaler, Inhale 1 puff into the lungs every 6 (six) hours as needed for wheezing or shortness of breath., Disp: 18 g, Rfl: 8 .  apixaban (ELIQUIS) 5 MG TABS tablet, Take 1 tablet (5 mg total) by mouth 2 (two) times daily., Disp: 180 tablet, Rfl: 1 .  atorvastatin (LIPITOR) 40 MG tablet, TAKE 1 TABLET BY MOUTH  DAILY (Patient taking differently: Take 40 mg by mouth daily. ), Disp: 90 tablet, Rfl: 3 .  clonazePAM (KLONOPIN) 1 MG tablet, TAKE 1 TABLET BY MOUTH THREE TIMES A DAY AS NEEDED FOR ANXIETY, Disp: 90 tablet, Rfl: 5 .  furosemide (LASIX) 40 MG tablet, Take 1 tablet (40 mg total) by mouth daily as needed for fluid. (Patient not taking: Reported on 06/26/2019), Disp: 90 tablet, Rfl: 1 .  glimepiride (AMARYL) 2 MG tablet, Take 1 tablet (2 mg total) by mouth 2 (two) times daily. (Patient taking differently: Take 2 mg by mouth 2 (two) times daily. ), Disp: 180 tablet, Rfl: 1 .  Insulin Pen Needle (BD PEN NEEDLE NANO 2ND GEN) 32G X 4 MM MISC, To use with humalog injection ACHS, Disp: 100 each, Rfl: 0 .  levothyroxine (SYNTHROID) 100 MCG tablet, Take 1 tablet (100 mcg total) by mouth daily at 6 (six) AM., Disp: 90 tablet, Rfl: 1 .  lisinopril (ZESTRIL) 10 MG tablet, TAKE 1  TABLET BY MOUTH  DAILY (Patient taking differently: Take 10 mg by mouth daily. ), Disp: 90 tablet, Rfl: 3 .  magic mouthwash w/lidocaine SOLN, Take 5 mLs by mouth 4 (four) times daily., Disp: 240 mL, Rfl: 0 .  metFORMIN (GLUCOPHAGE-XR) 500 MG 24 hr tablet, Take 1 tablet (500 mg total) by mouth 2 (two) times daily., Disp: 180 tablet, Rfl: 1 .  metoprolol tartrate (LOPRESSOR) 50  MG tablet, Take 1 tablet (50 mg total) by mouth 2 (two) times daily., Disp: 180 tablet, Rfl: 1 .  osimertinib mesylate (TAGRISSO) 80 MG tablet, Take 1 tablet (80 mg total) by mouth daily., Disp: 30 tablet, Rfl: 1 .  oxyCODONE (OXY IR/ROXICODONE) 5 MG immediate release tablet, Take 1 tablet (5 mg total) by mouth every 4 (four) hours as needed for severe pain. (Patient not taking: Reported on 06/26/2019), Disp: 60 tablet, Rfl: 0 .  pantoprazole (PROTONIX) 40 MG tablet, Take 1 tablet (40 mg total) by mouth daily., Disp: 30 tablet, Rfl: 0 .  potassium chloride SA (KLOR-CON) 20 MEQ tablet, Take 1 tablet (20 mEq total) by mouth once for 1 dose., Disp: 5 tablet, Rfl: 0 .  sitaGLIPtin (JANUVIA) 100 MG tablet, Take 100 mg by mouth daily., Disp: , Rfl:  No current facility-administered medications for this visit.  Facility-Administered Medications Ordered in Other Visits:  .  sodium chloride flush (NS) 0.9 % injection 10 mL, 10 mL, Intravenous, PRN, Faythe Casa E, NP, 10 mL at 07/06/19 1020  Physical exam:  Vitals:   07/06/19 0931  BP: 134/77  Pulse: 81  Resp: 20  Temp: (!) 96.9 F (36.1 C)  TempSrc: Tympanic  SpO2: 97%   Physical Exam Constitutional:      Appearance: Normal appearance.  HENT:     Head: Normocephalic and atraumatic.  Eyes:     Pupils: Pupils are equal, round, and reactive to light.  Cardiovascular:     Rate and Rhythm: Normal rate and regular rhythm.     Heart sounds: Normal heart sounds. No murmur.  Pulmonary:     Effort: Pulmonary effort is normal.     Breath sounds: Normal breath sounds. No  wheezing.  Abdominal:     General: Bowel sounds are normal. There is no distension.     Palpations: Abdomen is soft.     Tenderness: There is no abdominal tenderness.  Musculoskeletal:        General: Normal range of motion.     Cervical back: Normal range of motion.  Skin:    General: Skin is warm and dry.     Findings: No rash.  Neurological:     Mental Status: She is alert and oriented to person, place, and time.  Psychiatric:        Judgment: Judgment normal.      CMP Latest Ref Rng & Units 07/06/2019  Glucose 70 - 99 mg/dL 120(H)  BUN 8 - 23 mg/dL 18  Creatinine 0.44 - 1.00 mg/dL 0.39(L)  Sodium 135 - 145 mmol/L 133(L)  Potassium 3.5 - 5.1 mmol/L 3.3(L)  Chloride 98 - 111 mmol/L 100  CO2 22 - 32 mmol/L 23  Calcium 8.9 - 10.3 mg/dL 7.1(L)  Total Protein 6.5 - 8.1 g/dL 6.1(L)  Total Bilirubin 0.3 - 1.2 mg/dL 0.8  Alkaline Phos 38 - 126 U/L 184(H)  AST 15 - 41 U/L 25  ALT 0 - 44 U/L 26   CBC Latest Ref Rng & Units 07/06/2019  WBC 4.0 - 10.5 K/uL 12.6(H)  Hemoglobin 12.0 - 15.0 g/dL 14.2  Hematocrit 36.0 - 46.0 % 42.0  Platelets 150 - 400 K/uL 71(L)    No images are attached to the encounter.  CT HEAD WO CONTRAST  Result Date: 06/15/2019 CLINICAL DATA:  Altered mental status. EXAM: CT HEAD WITHOUT CONTRAST TECHNIQUE: Contiguous axial images were obtained from the base of the skull through the vertex without intravenous contrast. COMPARISON:  May 08, 2019.  FINDINGS: Brain: Increased left frontal lobe low density with sulcal effacement is noted concerning for infarction or edema secondary to underlying metastatic lesion. 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 Ventricular size is within normal limits. No definite evidence of hemorrhage is noted. No midline shift is noted. Vascular: No hyperdense vessel or unexpected calcification. Skull: Normal. Negative for fracture or focal lesion. Sinuses/Orbits: Bilateral ethmoid and sphenoid sinusitis. Other: None. IMPRESSION:  Increased left frontal lobe low density with sulcal effacement is noted concerning for infarction or edema secondary to underlying metastatic lesion. MRI with and without gadolinium is recommended for further evaluation. Electronically Signed   By: Marijo Conception M.D.   On: 06/15/2019 16:57   CT HEAD W CONTRAST  Result Date: 06/15/2019 CLINICAL DATA:  Lung cancer with abnormal noncontrast CT EXAM: CT HEAD WITH CONTRAST TECHNIQUE: Contiguous axial images were obtained from the base of the skull through the vertex with intravenous contrast. CONTRAST:  10m OMNIPAQUE IOHEXOL 300 MG/ML  SOLN COMPARISON:  Earlier same day, contrast CT 05/08/2019 FINDINGS: Brain: Increase in size of left frontal parenchymal lesion now measuring 14 x 8 mm (previously 11 x 8 mm). Surrounding edema has also increased. Additional more apparent but similar size 7 mm right frontal lesion (series 4, image 15). Possible additional subcentimeter right parietal lesions (series 2, image 22 and series 4, image 9); the latter was present on the prior study and is unchanged. Chronic left parietal infarction. No significant mass effect. No hydrocephalus. Vascular: Unremarkable. Skull: No new findings. Sinuses/Orbits: No acute finding. Other: None. IMPRESSION: Increase in size of left frontal metastasis and associated edema. Additional three subcentimeter lesions as described. Electronically Signed   By: PMacy MisM.D.   On: 06/15/2019 20:27   DG Chest Portable 1 View  Result Date: 06/15/2019 CLINICAL DATA:  Fever, metastatic lung cancer EXAM: PORTABLE CHEST 1 VIEW COMPARISON:  None. FINDINGS: Left perihilar density likely reflecting partially visualized mass. There is mild elevation of the left hemidiaphragm. Mild nonspecific interstitial prominence. Heart size is likely within normal limits for portable technique. Left chest wall ICD is present. Right chest wall port catheter tip overlies cavoatrial junction. IMPRESSION: Left perihilar  density likely reflecting partially visualized mass. Mild elevation of the left hemidiaphragm. Mild bibasilar atelectasis and nonspecific interstitial prominence. Electronically Signed   By: PMacy MisM.D.   On: 06/15/2019 18:42   CUP PACEART REMOTE DEVICE CHECK  Result Date: 06/22/2019 Scheduled remote reviewed. OptiVol alert triggered 06/21/19; TI below baseline since last interrogation on 06/08/19. No shocks or detects. Next remote 91 days. LFelisa Bonier RN, MSN Scheduled remote reviewed. OptiVol alert triggered 06/21/19; TI below baseline since last interrogation on 06/08/19. No shocks or detects. Next remote 91 days. LFelisa Bonier RN, MSN    Assessment and plan- Patient is a 79y.o. female who presents to symptom management for diarrhea x1 day.    Unclear etiology of diarrhea.  Likely related to [place]  According to up-to-date, patients can experience diarrhea 47 to 53% on Tagrisso and 21 to 40% with bevacizumab.  Diarrhea described as liquid in consistency.  Denies a foul smell.  Denies recent antibiotics.  Will check labs to identify any electrolyte abnormalities.  Patient was found to have hypokalemia with a potassium level of 3.3 and hyponatremia with a sodium level of 133.  Will plan to give 1 L of normal saline while in clinic and oral potassium 20 meq daily X 5 days.  Prescription sent to pharmacy.  We also will  collect a stool sample to rule out C. Difficile/GI panel.  Patient unable to produce specimen in clinic today will return at earliest convenience.  Can begin Imodium after results from C. difficile/GI panel have resulted and are negative.  Dr. Janese Banks recommends holding Tagrisso for the next couple days to see if diarrhea improves.  Patient to be seen on Thursday by Dr. Janese Banks.   Disposition: Collect/return stool sample to rule out infection. Hold Tagrisso to see if symptoms improve.  Begin potassium 20 meq daily x5 days. Imodium for diarrhea after negative stool sample. Stay  hydrated. RTC on 07/09/2019 for lab work and assess tolerance with Dr. Janese Banks.  Visit Diagnosis 1. Diarrhea, unspecified type     Patient expressed understanding and was in agreement with this plan. She also understands that She can call clinic at any time with any questions, concerns, or complaints.   Greater than 50% was spent in counseling and coordination of care with this patient including but not limited to discussion of the relevant topics above (See A&P) including, but not limited to diagnosis and management of acute and chronic medical conditions.   Thank you for allowing me to participate in the care of this very pleasant patient.    Jacquelin Hawking, NP [facility] Cell - [phone] Pager- [phone] 07/06/2019 2:53 PM   CC: Dr. Janese Banks

## 2019-07-06 NOTE — Patient Instructions (Signed)
So sorry you are not feeling well today.  I spoke with Dr. Janese Banks and she recommends stopping Tagrisso for the next couple days to see if your diarrhea improves.  I will call you on Wednesday to assess and possibly reinitiate Tagrisso.  Your lab work showed mild low potassium levels and mild low sodium levels which is a direct result from your diarrhea.  If diarrhea continues I would like to get a sample of it to rule out infection.  I will provide you with a kit.  If you get a sample please bring it back to the cancer center as soon as you get it.  All you need to do was dropped off at the front and they will take it from there.  I will call you with results.  Also, since your potassium was a little low I would like for you to take a potassium tablet daily for 5 days.  I have sent this prescription to [facility].  Please pick this up today and begin taking.  We are interested to know if you are taking your Eliquis.  Dr. Janese Banks says you are supposed to be taking this and per your medical review it says it is on hold.  Please verify that you were taking this medication.   You are given half a liter of normal saline while you are in clinic today to help with hydration.  Please try to drink plenty of fluids while at home.  I will touch base with you on Wednesday to see how your diarrhea is.  If it is persistent or worsens please call the clinic [phone].  Thank you.  Faythe Casa, NP 07/06/2019 10:57 AM

## 2019-07-07 DIAGNOSIS — C3492 Malignant neoplasm of unspecified part of left bronchus or lung: Secondary | ICD-10-CM | POA: Diagnosis not present

## 2019-07-07 DIAGNOSIS — I4891 Unspecified atrial fibrillation: Secondary | ICD-10-CM | POA: Diagnosis not present

## 2019-07-07 DIAGNOSIS — D6959 Other secondary thrombocytopenia: Secondary | ICD-10-CM | POA: Diagnosis not present

## 2019-07-07 DIAGNOSIS — I5022 Chronic systolic (congestive) heart failure: Secondary | ICD-10-CM | POA: Diagnosis not present

## 2019-07-07 DIAGNOSIS — G9341 Metabolic encephalopathy: Secondary | ICD-10-CM | POA: Diagnosis not present

## 2019-07-07 DIAGNOSIS — I110 Hypertensive heart disease with heart failure: Secondary | ICD-10-CM | POA: Diagnosis not present

## 2019-07-07 DIAGNOSIS — C7931 Secondary malignant neoplasm of brain: Secondary | ICD-10-CM | POA: Diagnosis not present

## 2019-07-07 DIAGNOSIS — I255 Ischemic cardiomyopathy: Secondary | ICD-10-CM | POA: Diagnosis not present

## 2019-07-07 DIAGNOSIS — I2510 Atherosclerotic heart disease of native coronary artery without angina pectoris: Secondary | ICD-10-CM | POA: Diagnosis not present

## 2019-07-07 DIAGNOSIS — I252 Old myocardial infarction: Secondary | ICD-10-CM | POA: Diagnosis not present

## 2019-07-07 DIAGNOSIS — D61810 Antineoplastic chemotherapy induced pancytopenia: Secondary | ICD-10-CM | POA: Diagnosis not present

## 2019-07-07 DIAGNOSIS — C7951 Secondary malignant neoplasm of bone: Secondary | ICD-10-CM | POA: Diagnosis not present

## 2019-07-07 DIAGNOSIS — T451X5S Adverse effect of antineoplastic and immunosuppressive drugs, sequela: Secondary | ICD-10-CM | POA: Diagnosis not present

## 2019-07-09 ENCOUNTER — Other Ambulatory Visit: Payer: Self-pay | Admitting: Pharmacist

## 2019-07-09 ENCOUNTER — Other Ambulatory Visit: Payer: Self-pay

## 2019-07-09 ENCOUNTER — Inpatient Hospital Stay: Payer: Medicare Other | Attending: Oncology

## 2019-07-09 ENCOUNTER — Inpatient Hospital Stay: Payer: Medicare Other

## 2019-07-09 ENCOUNTER — Encounter: Payer: Self-pay | Admitting: Oncology

## 2019-07-09 ENCOUNTER — Inpatient Hospital Stay: Payer: Medicare Other | Admitting: Oncology

## 2019-07-09 VITALS — BP 119/41 | HR 73 | Temp 96.6°F | Resp 16 | Wt 148.0 lb

## 2019-07-09 DIAGNOSIS — C7951 Secondary malignant neoplasm of bone: Secondary | ICD-10-CM | POA: Insufficient documentation

## 2019-07-09 DIAGNOSIS — Z79899 Other long term (current) drug therapy: Secondary | ICD-10-CM

## 2019-07-09 DIAGNOSIS — D696 Thrombocytopenia, unspecified: Secondary | ICD-10-CM

## 2019-07-09 DIAGNOSIS — Z5112 Encounter for antineoplastic immunotherapy: Secondary | ICD-10-CM | POA: Diagnosis not present

## 2019-07-09 DIAGNOSIS — C3490 Malignant neoplasm of unspecified part of unspecified bronchus or lung: Secondary | ICD-10-CM

## 2019-07-09 DIAGNOSIS — E1165 Type 2 diabetes mellitus with hyperglycemia: Secondary | ICD-10-CM | POA: Insufficient documentation

## 2019-07-09 DIAGNOSIS — C7931 Secondary malignant neoplasm of brain: Secondary | ICD-10-CM | POA: Insufficient documentation

## 2019-07-09 LAB — CBC WITH DIFFERENTIAL/PLATELET
Abs Immature Granulocytes: 0.16 10*3/uL — ABNORMAL HIGH (ref 0.00–0.07)
Basophils Absolute: 0 10*3/uL (ref 0.0–0.1)
Basophils Relative: 0 %
Eosinophils Absolute: 0 10*3/uL (ref 0.0–0.5)
Eosinophils Relative: 1 %
HCT: 40.1 % (ref 36.0–46.0)
Hemoglobin: 13.5 g/dL (ref 12.0–15.0)
Immature Granulocytes: 2 %
Lymphocytes Relative: 2 %
Lymphs Abs: 0.2 10*3/uL — ABNORMAL LOW (ref 0.7–4.0)
MCH: 28 pg (ref 26.0–34.0)
MCHC: 33.7 g/dL (ref 30.0–36.0)
MCV: 83.2 fL (ref 80.0–100.0)
Monocytes Absolute: 0.4 10*3/uL (ref 0.1–1.0)
Monocytes Relative: 4 %
Neutro Abs: 8.1 10*3/uL — ABNORMAL HIGH (ref 1.7–7.7)
Neutrophils Relative %: 91 %
Platelets: 81 10*3/uL — ABNORMAL LOW (ref 150–400)
RBC: 4.82 MIL/uL (ref 3.87–5.11)
RDW: 18.8 % — ABNORMAL HIGH (ref 11.5–15.5)
WBC: 8.9 10*3/uL (ref 4.0–10.5)
nRBC: 0 % (ref 0.0–0.2)

## 2019-07-09 LAB — COMPREHENSIVE METABOLIC PANEL
ALT: 28 U/L (ref 0–44)
AST: 26 U/L (ref 15–41)
Albumin: 3.1 g/dL — ABNORMAL LOW (ref 3.5–5.0)
Alkaline Phosphatase: 154 U/L — ABNORMAL HIGH (ref 38–126)
Anion gap: 12 (ref 5–15)
BUN: 10 mg/dL (ref 8–23)
CO2: 23 mmol/L (ref 22–32)
Calcium: 7.1 mg/dL — ABNORMAL LOW (ref 8.9–10.3)
Chloride: 99 mmol/L (ref 98–111)
Creatinine, Ser: 0.43 mg/dL — ABNORMAL LOW (ref 0.44–1.00)
GFR calc Af Amer: >60 mL/min (ref >60)
GFR calc non Af Amer: >60 mL/min (ref >60)
Glucose, Bld: 220 mg/dL — ABNORMAL HIGH (ref 70–99)
Potassium: 4.1 mmol/L (ref 3.5–5.1)
Sodium: 134 mmol/L — ABNORMAL LOW (ref 135–145)
Total Bilirubin: 0.8 mg/dL (ref 0.3–1.2)
Total Protein: 5.8 g/dL — ABNORMAL LOW (ref 6.5–8.1)

## 2019-07-09 MED ORDER — OSIMERTINIB MESYLATE 40 MG PO TABS: 40.0000 mg | ORAL_TABLET | Freq: Every day | ORAL | 1 refills | Status: AC

## 2019-07-09 MED ORDER — SODIUM CHLORIDE 0.9 % IV SOLN
Freq: Once | INTRAVENOUS | Status: AC
  Filled 2019-07-09: qty 250

## 2019-07-09 MED ORDER — SODIUM CHLORIDE 0.9% FLUSH
10.0000 mL | Freq: Once | INTRAVENOUS | Status: AC
  Administered 2019-07-09: 10 mL via INTRAVENOUS
  Filled 2019-07-09: qty 10

## 2019-07-09 MED ORDER — SODIUM CHLORIDE 0.9 % IV SOLN
10.0000 mg/kg | Freq: Once | INTRAVENOUS | Status: AC
  Administered 2019-07-09: 700 mg via INTRAVENOUS
  Filled 2019-07-09: qty 16

## 2019-07-09 MED ORDER — HEPARIN SOD (PORK) LOCK FLUSH 100 UNIT/ML IV SOLN
INTRAVENOUS | Status: AC
  Filled 2019-07-09: qty 5

## 2019-07-09 MED ORDER — HEPARIN SOD (PORK) LOCK FLUSH 100 UNIT/ML IV SOLN
500.0000 [IU] | Freq: Once | INTRAVENOUS | Status: AC | PRN
  Administered 2019-07-09: 500 [IU]
  Filled 2019-07-09: qty 5

## 2019-07-09 NOTE — Progress Notes (Signed)
Per RN Judeen Hammans, Dr Janese Banks states ok to proceed with treatment today without urine sample for urine protein result.

## 2019-07-09 NOTE — Progress Notes (Signed)
Pt on long acting insulin and sone left all the list in the car and his brother brought them and left them. Also has short acting insuling with SSI. sheis now down to 1/2 decadron bid. Sugars are being low late in ngiht and early in am and he has given her OJ when that happens. She needs asst getting up and moving around but she can walk but someone with her to be safe. No diarrhea since / and he has been giving her imodium each day.

## 2019-07-10 DIAGNOSIS — I110 Hypertensive heart disease with heart failure: Secondary | ICD-10-CM | POA: Diagnosis not present

## 2019-07-10 DIAGNOSIS — I4891 Unspecified atrial fibrillation: Secondary | ICD-10-CM | POA: Diagnosis not present

## 2019-07-10 DIAGNOSIS — I255 Ischemic cardiomyopathy: Secondary | ICD-10-CM | POA: Diagnosis not present

## 2019-07-10 DIAGNOSIS — G9341 Metabolic encephalopathy: Secondary | ICD-10-CM | POA: Diagnosis not present

## 2019-07-10 DIAGNOSIS — D6959 Other secondary thrombocytopenia: Secondary | ICD-10-CM | POA: Diagnosis not present

## 2019-07-10 DIAGNOSIS — I5022 Chronic systolic (congestive) heart failure: Secondary | ICD-10-CM | POA: Diagnosis not present

## 2019-07-10 DIAGNOSIS — D61810 Antineoplastic chemotherapy induced pancytopenia: Secondary | ICD-10-CM | POA: Diagnosis not present

## 2019-07-10 DIAGNOSIS — I252 Old myocardial infarction: Secondary | ICD-10-CM | POA: Diagnosis not present

## 2019-07-10 DIAGNOSIS — C7951 Secondary malignant neoplasm of bone: Secondary | ICD-10-CM | POA: Diagnosis not present

## 2019-07-10 DIAGNOSIS — C3492 Malignant neoplasm of unspecified part of left bronchus or lung: Secondary | ICD-10-CM | POA: Diagnosis not present

## 2019-07-10 DIAGNOSIS — T451X5S Adverse effect of antineoplastic and immunosuppressive drugs, sequela: Secondary | ICD-10-CM | POA: Diagnosis not present

## 2019-07-10 DIAGNOSIS — I2510 Atherosclerotic heart disease of native coronary artery without angina pectoris: Secondary | ICD-10-CM | POA: Diagnosis not present

## 2019-07-10 DIAGNOSIS — C7931 Secondary malignant neoplasm of brain: Secondary | ICD-10-CM | POA: Diagnosis not present

## 2019-07-10 NOTE — Addendum Note (Signed)
Addended by: Randa Evens C on: 07/10/2019 12:59 PM   Modules accepted: Level of Service

## 2019-07-10 NOTE — Progress Notes (Addendum)
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 She was also a chronic smoker but quit smoking in 2006. More recently patient presented with pain in her left arm which led to an x-ray. X-ray showed comminuted possibly pathologic midshaft humeral fracture. This was followed by a whole-body bone scan which showed multiple sites of abnormal tracer uptake involving left scapula, bilateral posterior ribs, thoracic and lumbar spine, pelvis and multiple sites in the proximal and distal left femur as well as left humeral diaphysis.  PET CT scan shows 4 cm left retrohilar mass in the upper lobe but invading across the major fissure into the left lower lobe with an SUV of 9.1. Scattered osseous metastasis with humeral pathological fracture.  Mri brain showed brain mets  She completed palliative WBRT and palliative RT to left arm and right hip  Acetabular biopsy showed adenocarcinoma. NGSpanel which came back after 1 cycle of chemotherapy showedEGFR C2573T >G (X038B). PDL1 2%  Chemotherapy was held and plan was to switch patient to [place].  She presented the hospital with decline in her mental status confusion and urinary retention.  Repeat MRI showed mild increase in the size of the left frontal lobe mass with  surrounding edema.  Mental status has not improved despite prolonged course of steroids.  She was started on bevacizumab to see if edema around the left frontal lesion can improve and improve her mental status  Interval history-patient had multiple episodes of diarrhea last week and was seen at the symptom management clinic.  She received IV fluids and Tagrisso was put on hold.  Today patient is here with her son Harrell Gave.  No significant change in her mental status so far.  She is oriented to self, place and person but has paucity of thought.  She ambulates slowly with the help of a walker but needs assistance with some of her ADLs and IADLs.  Denies any significant pain at this time  ECOG PS- 2 Pain scale- 0   Review of systems: Review of systems somewhat limited as patient has paucity of thought and does not verbalize much - Review of Systems  Constitutional: Positive for malaise/fatigue. Negative for chills, fever and weight loss.  HENT: Negative for congestion, ear discharge and nosebleeds.   Eyes: Negative for blurred vision.  Respiratory: Negative for cough, hemoptysis, sputum production, shortness of breath and wheezing.   Cardiovascular: Negative for chest pain, palpitations, orthopnea and claudication.  Gastrointestinal: Negative for abdominal pain, blood in stool, constipation, diarrhea, heartburn, melena, nausea and vomiting.  Genitourinary: Negative for dysuria, flank pain, frequency, hematuria and urgency.  Musculoskeletal: Negative for back pain, joint pain and myalgias.  Skin: Negative for rash.  Neurological: Negative for dizziness, tingling, focal weakness, seizures, weakness and headaches.  Endo/Heme/Allergies: Does not bruise/bleed easily.  Psychiatric/Behavioral: Negative for depression and suicidal ideas. The patient does not have insomnia.  Allergies  Allergen Reactions  . Latex Rash  . Nickel Rash     Past Medical History:  Diagnosis Date  . AICD  (automatic cardioverter/defibrillator) present   . Anxiety   . CHF (congestive heart failure) ([place])   . Diabetes mellitus with neuropathy ([place])   . Dyspnea   . Hypertension   . Hypertensive heart disease   . Implantable cardioverter-defibrillator (ICD)-[employer]   . Lung cancer ([place])    mets to brain  . Myocardial infarction, anterior wall ([place])   . Obesity, Class II, BMI 35-39.9      Past Surgical History:  Procedure Laterality Date  . CARDIOVERSION N/A 05/12/2019   Procedure: CARDIOVERSION;  Surgeon: Nelva Bush, MD;  Location: [facility] ORS;  Service: Cardiovascular;  Laterality: N/A;  . CARPAL TUNNEL RELEASE    . cataract surgery    . EYE SURGERY    . FRACTURE SURGERY Left    ARM  . JOINT REPLACEMENT Left    tkr  . PORTA CATH INSERTION N/A 05/14/2019   Procedure: PORTA CATH INSERTION;  Surgeon: Algernon Huxley, MD;  Location: [place] [facility];  Service: Cardiovascular;  Laterality: N/A;  . VIDEO BRONCHOSCOPY WITH ENDOBRONCHIAL ULTRASOUND N/A 05/11/2019   Procedure: VIDEO BRONCHOSCOPY WITH ENDOBRONCHIAL ULTRASOUND;  Surgeon: Tyler Pita, MD;  Location: [facility] ORS;  Service: Pulmonary;  Laterality: N/A;    Social History   Socioeconomic History  . Marital status: Widowed    Spouse name: Not on file  . Number of children: 3  . Years of education: Not on file  . Highest education level: High school graduate  Occupational History  . Occupation: retired  Tobacco Use  . Smoking status: Former Smoker    Packs/day: 1.00    Years: 55.00    Pack years: 55.00    Types: Cigarettes    Quit date: 2006    Years since quitting: 15.2  . Smokeless tobacco: Never Used  . Tobacco comment: quit about 18 years ago; as of 2019  Substance and Sexual Activity  . Alcohol use: No  . Drug use: No  . Sexual activity: Not on file  Other Topics Concern  . Not on file  Social History Narrative  . Not on file   Social Determinants of Health   Financial Resource Strain:   .  Difficulty of Paying Living Expenses:   Food Insecurity:   . Worried About Charity fundraiser in the Last Year:   . Arboriculturist in the Last Year:   Transportation Needs:   . Film/video editor (Medical):   Marland Kitchen Lack of Transportation (Non-Medical):   Physical Activity: Inactive  . Days of Exercise per Week: 0 days  . Minutes of Exercise per Session: 0 min  Stress: No Stress Concern Present  . Feeling of Stress : Not at all  Social Connections: Unknown  . Frequency of Communication with Friends and Family: Patient refused  . Frequency of Social Gatherings with Friends and Family: Patient refused  . Attends Religious Services: Patient refused  . Active Member of Clubs or Organizations: Patient refused  . Attends Archivist Meetings: Patient refused  . Marital Status: Patient refused  Intimate Partner Violence: Unknown  . Fear of Current or Ex-Partner: Patient refused  . Emotionally Abused: Patient refused  . Physically Abused: Patient refused  . Sexually Abused: Patient refused    Family History  Problem Relation Age of Onset  . Scleroderma Mother   . Raynaud syndrome  Mother   . Cancer Father 26       colon cancer  . Heart disease Brother        died of heart attack  . Diabetes Brother      Current Outpatient Medications:  .  albuterol (VENTOLIN HFA) 108 (90 Base) MCG/ACT inhaler, Inhale 1 puff into the lungs every 6 (six) hours as needed for wheezing or shortness of breath., Disp: 18 g, Rfl: 8 .  apixaban (ELIQUIS) 5 MG TABS tablet, Take 1 tablet (5 mg total) by mouth 2 (two) times daily., Disp: 180 tablet, Rfl: 1 .  atorvastatin (LIPITOR) 40 MG tablet, TAKE 1 TABLET BY MOUTH  DAILY (Patient taking differently: Take 40 mg by mouth daily. ), Disp: 90 tablet, Rfl: 3 .  Calcium Carbonate (CALCIUM 600 PO), Take 2 Doses by mouth daily., Disp: , Rfl:  .  dexamethasone (DECADRON) 4 MG tablet, Take 2 mg by mouth 2 (two) times daily., Disp: , Rfl:  .  Insulin Pen  Needle (BD PEN NEEDLE NANO 2ND GEN) 32G X 4 MM MISC, To use with humalog injection ACHS, Disp: 100 each, Rfl: 0 .  levothyroxine (SYNTHROID) 100 MCG tablet, Take 1 tablet (100 mcg total) by mouth daily at 6 (six) AM., Disp: 90 tablet, Rfl: 1 .  lisinopril (ZESTRIL) 10 MG tablet, TAKE 1 TABLET BY MOUTH  DAILY (Patient taking differently: Take 10 mg by mouth daily. ), Disp: 90 tablet, Rfl: 3 .  loperamide (IMODIUM) 2 MG capsule, Take 2 mg by mouth as needed for diarrhea or loose stools., Disp: , Rfl:  .  magic mouthwash w/lidocaine SOLN, Take 5 mLs by mouth 4 (four) times daily., Disp: 240 mL, Rfl: 0 .  metFORMIN (GLUCOPHAGE-XR) 500 MG 24 hr tablet, Take 1 tablet (500 mg total) by mouth 2 (two) times daily., Disp: 180 tablet, Rfl: 1 .  metoprolol tartrate (LOPRESSOR) 50 MG tablet, Take 1 tablet (50 mg total) by mouth 2 (two) times daily., Disp: 180 tablet, Rfl: 1 .  pantoprazole (PROTONIX) 40 MG tablet, Take 1 tablet (40 mg total) by mouth daily., Disp: 30 tablet, Rfl: 0 .  potassium chloride SA (KLOR-CON) 20 MEQ tablet, Take 1 tablet (20 mEq total) by mouth once for 1 dose., Disp: 5 tablet, Rfl: 0 .  sitaGLIPtin (JANUVIA) 100 MG tablet, Take 100 mg by mouth daily., Disp: , Rfl:  .  clonazePAM (KLONOPIN) 1 MG tablet, TAKE 1 TABLET BY MOUTH THREE TIMES A DAY AS NEEDED FOR ANXIETY (Patient not taking: Reported on 07/09/2019), Disp: 90 tablet, Rfl: 5 .  furosemide (LASIX) 40 MG tablet, Take 1 tablet (40 mg total) by mouth daily as needed for fluid. (Patient not taking: Reported on 06/26/2019), Disp: 90 tablet, Rfl: 1 .  glimepiride (AMARYL) 2 MG tablet, Take 1 tablet (2 mg total) by mouth 2 (two) times daily. (Patient taking differently: Take 2 mg by mouth 2 (two) times daily. ), Disp: 180 tablet, Rfl: 1 .  osimertinib mesylate (TAGRISSO) 40 MG tablet, Take 1 tablet (40 mg total) by mouth daily., Disp: 30 tablet, Rfl: 1 .  oxyCODONE (OXY IR/ROXICODONE) 5 MG immediate release tablet, Take 1 tablet (5 mg total)  by mouth every 4 (four) hours as needed for severe pain. (Patient not taking: Reported on 06/26/2019), Disp: 60 tablet, Rfl: 0  Physical exam:  Vitals:   07/09/19 0945  BP: (!) 119/41  Pulse: 73  Resp: 16  Temp: (!) 96.6 F (35.9 C)  TempSrc: Tympanic  Weight: 148  lb (67.1 kg)   Physical Exam Constitutional:      General: She is not in acute distress. HENT:     Head: Normocephalic and atraumatic.  Eyes:     Pupils: Pupils are equal, round, and reactive to light.  Cardiovascular:     Rate and Rhythm: Normal rate and regular rhythm.     Heart sounds: Normal heart sounds.  Pulmonary:     Effort: Pulmonary effort is normal.     Breath sounds: Normal breath sounds.  Abdominal:     General: Bowel sounds are normal.     Palpations: Abdomen is soft.  Musculoskeletal:     Cervical back: Normal range of motion.  Skin:    General: Skin is warm and dry.  Neurological:     Mental Status: She is alert and oriented to person, place, and time.      CMP Latest Ref Rng & Units 07/09/2019  Glucose 70 - 99 mg/dL 220(H)  BUN 8 - 23 mg/dL 10  Creatinine 0.44 - 1.00 mg/dL 0.43(L)  Sodium 135 - 145 mmol/L 134(L)  Potassium 3.5 - 5.1 mmol/L 4.1  Chloride 98 - 111 mmol/L 99  CO2 22 - 32 mmol/L 23  Calcium 8.9 - 10.3 mg/dL 7.1(L)  Total Protein 6.5 - 8.1 g/dL 5.8(L)  Total Bilirubin 0.3 - 1.2 mg/dL 0.8  Alkaline Phos 38 - 126 U/L 154(H)  AST 15 - 41 U/L 26  ALT 0 - 44 U/L 28   CBC Latest Ref Rng & Units 07/09/2019  WBC 4.0 - 10.5 K/uL 8.9  Hemoglobin 12.0 - 15.0 g/dL 13.5  Hematocrit 36.0 - 46.0 % 40.1  Platelets 150 - 400 K/uL 81(L)    No images are attached to the encounter.  CT HEAD WO CONTRAST  Result Date: 06/15/2019 CLINICAL DATA:  Altered mental status. EXAM: CT HEAD WITHOUT CONTRAST TECHNIQUE: Contiguous axial images were obtained from the base of the skull through the vertex without intravenous contrast. COMPARISON:  May 08, 2019. FINDINGS: Brain: Increased left frontal  lobe low density with sulcal effacement is noted concerning for infarction or edema secondary to underlying metastatic lesion. 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 Ventricular size is within normal limits. No definite evidence of hemorrhage is noted. No midline shift is noted. Vascular: No hyperdense vessel or unexpected calcification. Skull: Normal. Negative for fracture or focal lesion. Sinuses/Orbits: Bilateral ethmoid and sphenoid sinusitis. Other: None. IMPRESSION: Increased left frontal lobe low density with sulcal effacement is noted concerning for infarction or edema secondary to underlying metastatic lesion. MRI with and without gadolinium is recommended for further evaluation. Electronically Signed   By: Marijo Conception M.D.   On: 06/15/2019 16:57   CT HEAD W CONTRAST  Result Date: 06/15/2019 CLINICAL DATA:  Lung cancer with abnormal noncontrast CT EXAM: CT HEAD WITH CONTRAST TECHNIQUE: Contiguous axial images were obtained from the base of the skull through the vertex with intravenous contrast. CONTRAST:  18m OMNIPAQUE IOHEXOL 300 MG/ML  SOLN COMPARISON:  Earlier same day, contrast CT 05/08/2019 FINDINGS: Brain: Increase in size of left frontal parenchymal lesion now measuring 14 x 8 mm (previously 11 x 8 mm). Surrounding edema has also increased. Additional more apparent but similar size 7 mm right frontal lesion (series 4, image 15). Possible additional subcentimeter right parietal lesions (series 2, image 22 and series 4, image 9); the latter was present on the prior study and is unchanged. Chronic left parietal infarction. No significant mass effect. No hydrocephalus. Vascular: Unremarkable. Skull:  No new findings. Sinuses/Orbits: No acute finding. Other: None. IMPRESSION: Increase in size of left frontal metastasis and associated edema. Additional three subcentimeter lesions as described. Electronically Signed   By: Macy Mis M.D.   On: 06/15/2019 20:27   DG Chest Portable 1  View  Result Date: 06/15/2019 CLINICAL DATA:  Fever, metastatic lung cancer EXAM: PORTABLE CHEST 1 VIEW COMPARISON:  None. FINDINGS: Left perihilar density likely reflecting partially visualized mass. There is mild elevation of the left hemidiaphragm. Mild nonspecific interstitial prominence. Heart size is likely within normal limits for portable technique. Left chest wall ICD is present. Right chest wall port catheter tip overlies cavoatrial junction. IMPRESSION: Left perihilar density likely reflecting partially visualized mass. Mild elevation of the left hemidiaphragm. Mild bibasilar atelectasis and nonspecific interstitial prominence. Electronically Signed   By: Macy Mis M.D.   On: 06/15/2019 18:42   CUP PACEART REMOTE DEVICE CHECK  Result Date: 06/22/2019 Scheduled remote reviewed. OptiVol alert triggered 06/21/19; TI below baseline since last interrogation on 06/08/19. No shocks or detects. Next remote 91 days. Felisa Bonier, RN, MSN Scheduled remote reviewed. OptiVol alert triggered 06/21/19; TI below baseline since last interrogation on 06/08/19. No shocks or detects. Next remote 91 days. Felisa Bonier, RN, MSN    Assessment and plan- Patient is a 79 y.o. 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    She is here for follow-up of following issues:  1.  Edema surrounding left frontal lobe lesion: She can proceed with dose 2 of bevacizumab today.Blood pressure is stable.  She is unable to give [place] a urine today but she had a urine protein last time which was trace.  Continue to monitor I will plan to give her 3-4 doses of bevacizumab followed by repeat MRI.  Patient will continue to take 2 mg twice daily of Decadron for 1 more week followed by 2 mg once a day for a week and then stop  2.  EGFR positive lung cancer: Patient's platelet counts are down to 81 today.  Improved as compared to 71 5 days ago.  I have asked her to hold the [place] for the next 1 week.  We will  repeat her CBC with differential next week and if platelet counts are improved she will restart Tagrisso at 40 mg daily.  [place] Tagrisso and bevacizumab can be associated with thrombocytopenia.  3.  I will see her back in 2 weeks time with CBC with differential, CMP and urine protein for dose 3 of bevacizumab.  CBC with differential in 1 week and possible fluids and  4.  Bone metastases: [place]ton See currently on hold due to ongoing hypocalcemia.  5.  Hyperglycemia: Son Harrell Gave reports blood sugars sometimes running as low in the 60s.  I have asked him to hold the Lantus and continue sliding scale insulin at this time   Visit Diagnosis 1. Thrombocytopenia ([place])   2. Encounter for monoclonal antibody treatment for malignancy   3. High risk medication use   4. Stage 4 malignant neoplasm of lung, unspecified laterality ([place])   5. Bone metastases ([place])      Dr. Randa Evens, MD, MPH [facility] [phone] 07/10/2019 12:46 PM

## 2019-07-13 ENCOUNTER — Ambulatory Visit (INDEPENDENT_AMBULATORY_CARE_PROVIDER_SITE_OTHER): Payer: Medicare Other

## 2019-07-13 ENCOUNTER — Encounter: Payer: Self-pay | Admitting: Oncology

## 2019-07-13 DIAGNOSIS — D61810 Antineoplastic chemotherapy induced pancytopenia: Secondary | ICD-10-CM | POA: Diagnosis not present

## 2019-07-13 DIAGNOSIS — I252 Old myocardial infarction: Secondary | ICD-10-CM | POA: Diagnosis not present

## 2019-07-13 DIAGNOSIS — C7951 Secondary malignant neoplasm of bone: Secondary | ICD-10-CM | POA: Diagnosis not present

## 2019-07-13 DIAGNOSIS — T451X5S Adverse effect of antineoplastic and immunosuppressive drugs, sequela: Secondary | ICD-10-CM | POA: Diagnosis not present

## 2019-07-13 DIAGNOSIS — C3492 Malignant neoplasm of unspecified part of left bronchus or lung: Secondary | ICD-10-CM | POA: Diagnosis not present

## 2019-07-13 DIAGNOSIS — Z95810 Presence of automatic (implantable) cardiac defibrillator: Secondary | ICD-10-CM

## 2019-07-13 DIAGNOSIS — G9341 Metabolic encephalopathy: Secondary | ICD-10-CM | POA: Diagnosis not present

## 2019-07-13 DIAGNOSIS — I110 Hypertensive heart disease with heart failure: Secondary | ICD-10-CM | POA: Diagnosis not present

## 2019-07-13 DIAGNOSIS — I2510 Atherosclerotic heart disease of native coronary artery without angina pectoris: Secondary | ICD-10-CM | POA: Diagnosis not present

## 2019-07-13 DIAGNOSIS — I5022 Chronic systolic (congestive) heart failure: Secondary | ICD-10-CM | POA: Diagnosis not present

## 2019-07-13 DIAGNOSIS — I509 Heart failure, unspecified: Secondary | ICD-10-CM

## 2019-07-13 DIAGNOSIS — D6959 Other secondary thrombocytopenia: Secondary | ICD-10-CM | POA: Diagnosis not present

## 2019-07-13 DIAGNOSIS — C7931 Secondary malignant neoplasm of brain: Secondary | ICD-10-CM | POA: Diagnosis not present

## 2019-07-13 DIAGNOSIS — I4891 Unspecified atrial fibrillation: Secondary | ICD-10-CM | POA: Diagnosis not present

## 2019-07-13 DIAGNOSIS — I255 Ischemic cardiomyopathy: Secondary | ICD-10-CM | POA: Diagnosis not present

## 2019-07-14 DIAGNOSIS — T451X5S Adverse effect of antineoplastic and immunosuppressive drugs, sequela: Secondary | ICD-10-CM | POA: Diagnosis not present

## 2019-07-14 DIAGNOSIS — I4891 Unspecified atrial fibrillation: Secondary | ICD-10-CM | POA: Diagnosis not present

## 2019-07-14 DIAGNOSIS — I5022 Chronic systolic (congestive) heart failure: Secondary | ICD-10-CM | POA: Diagnosis not present

## 2019-07-14 DIAGNOSIS — G9341 Metabolic encephalopathy: Secondary | ICD-10-CM | POA: Diagnosis not present

## 2019-07-14 DIAGNOSIS — D6959 Other secondary thrombocytopenia: Secondary | ICD-10-CM | POA: Diagnosis not present

## 2019-07-14 DIAGNOSIS — C7951 Secondary malignant neoplasm of bone: Secondary | ICD-10-CM | POA: Diagnosis not present

## 2019-07-14 DIAGNOSIS — C3492 Malignant neoplasm of unspecified part of left bronchus or lung: Secondary | ICD-10-CM | POA: Diagnosis not present

## 2019-07-14 DIAGNOSIS — I252 Old myocardial infarction: Secondary | ICD-10-CM | POA: Diagnosis not present

## 2019-07-14 DIAGNOSIS — I255 Ischemic cardiomyopathy: Secondary | ICD-10-CM | POA: Diagnosis not present

## 2019-07-14 DIAGNOSIS — D61810 Antineoplastic chemotherapy induced pancytopenia: Secondary | ICD-10-CM | POA: Diagnosis not present

## 2019-07-14 DIAGNOSIS — I110 Hypertensive heart disease with heart failure: Secondary | ICD-10-CM | POA: Diagnosis not present

## 2019-07-14 DIAGNOSIS — C7931 Secondary malignant neoplasm of brain: Secondary | ICD-10-CM | POA: Diagnosis not present

## 2019-07-14 DIAGNOSIS — I2510 Atherosclerotic heart disease of native coronary artery without angina pectoris: Secondary | ICD-10-CM | POA: Diagnosis not present

## 2019-07-14 NOTE — Progress Notes (Signed)
EPIC Encounter for ICM Monitoring  Patient Name: Megan Shelton is a 79 y.o. female Date: 07/14/2019 Primary Care Physican: Mar Daring, PA-C Primary Cardiologist:Klein Electrophysiologist:Klein 2/25/21Office Weight:151lbs    Transmission reviewed and results sent via my chart.  OptivolThoracic impedancenormal but was suggesting possible fluid accumulation from 3/21 - 3/28.  Prescribed:Furosemide40 mg take 1 tablet by mouth as needed  Labs: 06/04/2019 Creatinine 0.54, BUN 21, Potassium 3.8, Sodium 137, GFR >60 05/14/2019 Creatinine 0.57, BUN 16, Potassium 4.0, Sodium 138, GFR >60  05/13/2019 Creatinine 0.41, BUN 13, Potassium 3.6, Sodium 140, GFR >60  05/12/2019 Creatinine 0.65, BUN 21, Potassium 4.4, Sodium 138, GFR >60  05/11/2019 Creatinine 0.55, BUN 25, Potassium 4.2, Sodium 138, GFR >60  04/20/2019 Creatinine 0.40, BUN 13, Potassium 3.6, Sodium 137, GFR >60 A complete set of results can be found in Results Review.  Recommendations:None  Follow-up plan: ICM clinic phone appointment on5/01/2020. 91 day device clinic remote transmission 09/21/2019.   Copy of ICM check sent to [place]  3 month ICM trend: 07/13/2019    1 Year ICM trend:       Rosalene Billings, RN 07/14/2019 1:53 PM

## 2019-07-15 ENCOUNTER — Other Ambulatory Visit: Payer: Self-pay

## 2019-07-15 ENCOUNTER — Telehealth: Payer: Self-pay | Admitting: Internal Medicine

## 2019-07-15 ENCOUNTER — Ambulatory Visit (INDEPENDENT_AMBULATORY_CARE_PROVIDER_SITE_OTHER): Payer: Medicare Other | Admitting: Internal Medicine

## 2019-07-15 ENCOUNTER — Encounter: Payer: Self-pay | Admitting: Internal Medicine

## 2019-07-15 ENCOUNTER — Encounter: Payer: Self-pay | Admitting: Oncology

## 2019-07-15 VITALS — BP 130/82 | HR 81 | Ht 60.0 in | Wt 141.2 lb

## 2019-07-15 DIAGNOSIS — C7931 Secondary malignant neoplasm of brain: Secondary | ICD-10-CM | POA: Diagnosis not present

## 2019-07-15 DIAGNOSIS — C3490 Malignant neoplasm of unspecified part of unspecified bronchus or lung: Secondary | ICD-10-CM

## 2019-07-15 DIAGNOSIS — I4891 Unspecified atrial fibrillation: Secondary | ICD-10-CM | POA: Diagnosis not present

## 2019-07-15 DIAGNOSIS — I2510 Atherosclerotic heart disease of native coronary artery without angina pectoris: Secondary | ICD-10-CM | POA: Diagnosis not present

## 2019-07-15 DIAGNOSIS — C3492 Malignant neoplasm of unspecified part of left bronchus or lung: Secondary | ICD-10-CM | POA: Diagnosis not present

## 2019-07-15 DIAGNOSIS — T451X5S Adverse effect of antineoplastic and immunosuppressive drugs, sequela: Secondary | ICD-10-CM | POA: Diagnosis not present

## 2019-07-15 DIAGNOSIS — G9341 Metabolic encephalopathy: Secondary | ICD-10-CM | POA: Diagnosis not present

## 2019-07-15 DIAGNOSIS — I110 Hypertensive heart disease with heart failure: Secondary | ICD-10-CM | POA: Diagnosis not present

## 2019-07-15 DIAGNOSIS — I5022 Chronic systolic (congestive) heart failure: Secondary | ICD-10-CM

## 2019-07-15 DIAGNOSIS — C7951 Secondary malignant neoplasm of bone: Secondary | ICD-10-CM | POA: Diagnosis not present

## 2019-07-15 DIAGNOSIS — I255 Ischemic cardiomyopathy: Secondary | ICD-10-CM | POA: Diagnosis not present

## 2019-07-15 DIAGNOSIS — I252 Old myocardial infarction: Secondary | ICD-10-CM | POA: Diagnosis not present

## 2019-07-15 DIAGNOSIS — D61810 Antineoplastic chemotherapy induced pancytopenia: Secondary | ICD-10-CM | POA: Diagnosis not present

## 2019-07-15 DIAGNOSIS — I480 Paroxysmal atrial fibrillation: Secondary | ICD-10-CM

## 2019-07-15 DIAGNOSIS — D6959 Other secondary thrombocytopenia: Secondary | ICD-10-CM | POA: Diagnosis not present

## 2019-07-15 NOTE — Progress Notes (Signed)
Follow-up Outpatient Visit Date: 07/15/2019  Primary Care Provider: Rubye Beach [address] [place] [address]  Chief Complaint: Follow-up atrial fibrillation, coronary artery disease, and heart failure  HPI:  Megan Shelton is a 79 y.o. female with history of coronary artery disease with remote anterior MI, ischemic cardiomyopathy with normalization of LVEF status post ICD (followed by Dr. Caryl Comes), paroxysmal atrial fibrillation status post cardioversion (05/12/2019) hypertension, diabetes mellitus, and stage IV non-small cell lung cancer, who presents for follow-up of coronary artery disease and cardiomyopathy.  I last saw Megan Shelton in October, at which time she was doing well, though she noted chronic exertional dyspnea when walking up hills that have been present for years.  Since her last visit, she was diagnosed with stage IV lung cancer and was also hospitalized with atrial fibrillation with rapid ventricular response.  She underwent successful cardioversion.  Today, Megan Shelton reports that she is feeling well.  However, her son, who accompanies her today, notes that his mother's memory and cognition have been quite poor over the last 1 to 2 months.  This has coincided with radiation of cerebral metastases of her non-small cell lung cancer.  From a heart standpoint, Megan Shelton has been stable, denying chest pain, shortness of breath, palpitations, lightheadedness, and edema.  She remains on anticoagulation with apixaban without bleeding.  --------------------------------------------------------------------------------------------------  Cardiovascular History & Procedures: Cardiovascular Problems:  Coronary artery disease  Ischemic cardiomyopathy with normalization of LVEF  Paroxysmal atrial fibrillation  Risk Factors:  No coronary artery disease, hypertension, diabetes mellitus, and age greater than 47  Cath/PCI:  LHC/PCI (04/2006, [place]): Severe disease involving the proximal LAD (question CTO) and 90% OM1 stenosis.  Successful PCI to OM1.  CV Surgery:  None available  EP Procedures and Devices:  Cardioversion (05/12/2019)  ICD implantation  Non-Invasive Evaluation(s):  TTE (05/12/2019): Mildly to moderately dilated left ventricle.  LVEF 45-50% with grade 2 diastolic dysfunction.  Normal RV size and function.  Mild left atrial enlargement.  Mild to moderate mitral regurgitation.  Aortic sclerosis with trivial regurgitation.  Outside echocardiogram (10/08/2016): Normal LV size with mild LVH.  LVEF greater than 55% with grade 1 diastolic dysfunction.  Normal RV size and function.  Mild left atrial enlargement.  Aortic sclerosis.  Mild to moderate tricuspid regurgitation.  Recent CV Pertinent Labs: Lab Results  Component Value Date   CHOL 156 11/21/2018   HDL 55 11/21/2018   LDLCALC 69 11/21/2018   TRIG 158 (H) 11/21/2018   CHOLHDL 2.8 11/21/2018   INR 1.2 06/16/2019   K 4.1 07/09/2019   MG 1.9 07/06/2019   BUN 10 07/09/2019   BUN 12 11/21/2018   CREATININE 0.43 (L) 07/09/2019    Past medical and surgical history were reviewed and updated in EPIC.  Current Meds  Medication Sig  . albuterol (VENTOLIN HFA) 108 (90 Base) MCG/ACT inhaler Inhale 1 puff into the lungs every 6 (six) hours as needed for wheezing or shortness of breath.  Marland Kitchen apixaban (ELIQUIS) 5 MG TABS tablet Take 1 tablet (5 mg total) by mouth 2 (two) times daily.  Marland Kitchen atorvastatin (LIPITOR) 40 MG tablet TAKE 1 TABLET BY MOUTH  DAILY (Patient taking differently: Take 40 mg by mouth daily. )  . Calcium Carbonate (CALCIUM 600 PO) Take 2 Doses by mouth daily.  . clonazePAM (KLONOPIN) 1 MG tablet TAKE 1 TABLET BY MOUTH THREE TIMES A DAY AS NEEDED FOR ANXIETY  . dexamethasone (DECADRON) 4 MG tablet  Take 2 mg by mouth 2 (two) times daily. Will decrease to 1/2 tablet daily as of 07/16/2019  . furosemide (LASIX) 40 MG tablet Take 1 tablet (40 mg total) by  mouth daily as needed for fluid.  Marland Kitchen glimepiride (AMARYL) 2 MG tablet Take 1 tablet (2 mg total) by mouth 2 (two) times daily. (Patient taking differently: Take 2 mg by mouth 2 (two) times daily. )  . Insulin Pen Needle (BD PEN NEEDLE NANO 2ND GEN) 32G X 4 MM MISC To use with humalog injection ACHS  . levothyroxine (SYNTHROID) 100 MCG tablet Take 1 tablet (100 mcg total) by mouth daily at 6 (six) AM.  . lisinopril (ZESTRIL) 10 MG tablet TAKE 1 TABLET BY MOUTH  DAILY (Patient taking differently: Take 10 mg by mouth daily. )  . loperamide (IMODIUM) 2 MG capsule Take 2 mg by mouth as needed for diarrhea or loose stools.  . metFORMIN (GLUCOPHAGE-XR) 500 MG 24 hr tablet Take 1 tablet (500 mg total) by mouth 2 (two) times daily.  . metoprolol tartrate (LOPRESSOR) 50 MG tablet Take 1 tablet (50 mg total) by mouth 2 (two) times daily.  Marland Kitchen oxyCODONE (OXY IR/ROXICODONE) 5 MG immediate release tablet Take 1 tablet (5 mg total) by mouth every 4 (four) hours as needed for severe pain.  . pantoprazole (PROTONIX) 40 MG tablet Take 1 tablet (40 mg total) by mouth daily.  . sitaGLIPtin (JANUVIA) 100 MG tablet Take 100 mg by mouth daily.    Allergies: Latex and Nickel  Social History   Tobacco Use  . Smoking status: Former Smoker    Packs/day: 1.00    Years: 55.00    Pack years: 55.00    Types: Cigarettes    Quit date: 2006    Years since quitting: 15.2  . Smokeless tobacco: Never Used  . Tobacco comment: quit about 18 years ago; as of 2019  Substance Use Topics  . Alcohol use: No  . Drug use: No    Family History  Problem Relation Age of Onset  . Scleroderma Mother   . Raynaud syndrome Mother   . Cancer Father 59       colon cancer  . Heart disease Brother        died of heart attack  . Diabetes Brother     Review of Systems: A 12-system review of systems was performed and was negative except as noted in the HPI.   --------------------------------------------------------------------------------------------------  Physical Exam: BP 130/82 (BP Location: Left Arm, Patient Position: Sitting, Cuff Size: Normal)   Pulse 81   Ht 5' (1.524 m)   Wt 141 lb 4 oz (64.1 kg)   SpO2 97%   BMI 27.59 kg/m   General: NAD.  Accompanied by her son. HEENT: No conjunctival pallor or scleral icterus. Facemask in place. Neck: No JVD or HJR. Lungs: Normal work of breathing. Clear to auscultation bilaterally without wheezes or crackles. Heart: Regular rate and rhythm without murmurs, rubs, or gallops. Abd: Bowel sounds present. Soft, NT/ND without hepatosplenomegaly Ext: No lower extremity edema.  EKG: Normal sinus rhythm with bifascicular block (LAFB and RBBB), LVH, poor R wave progression, and lateral T wave inversions.  Compared with prior tracing from 06/29/2019, PACs are no longer present.  Heart rate has increased.  Lab Results  Component Value Date   WBC 8.9 07/09/2019   HGB 13.5 07/09/2019   HCT 40.1 07/09/2019   MCV 83.2 07/09/2019   PLT 81 (L) 07/09/2019    Lab Results  Component Value Date   NA 134 (L) 07/09/2019   K 4.1 07/09/2019   CL 99 07/09/2019   CO2 23 07/09/2019   BUN 10 07/09/2019   CREATININE 0.43 (L) 07/09/2019   GLUCOSE 220 (H) 07/09/2019   ALT 28 07/09/2019    Lab Results  Component Value Date   CHOL 156 11/21/2018   HDL 55 11/21/2018   LDLCALC 69 11/21/2018   TRIG 158 (H) 11/21/2018   CHOLHDL 2.8 11/21/2018    --------------------------------------------------------------------------------------------------  ASSESSMENT AND PLAN: Paroxysmal atrial fibrillation: Megan Shelton is maintaining sinus rhythm following cardioversion in 05/2019.  Given stage IV non-small cell lung cancer with brain metastases, I am somewhat concerned about the potential for intracerebral hemorrhage.  I have spoken with Dr. Janese Banks, who notes that there are both bleeding and thrombotic risks associated with  Megan Shelton's malignancy and treatment.  We have agreed to continue with apixaban, as benefits likely outweigh risks at this time.  Metoprolol tartrate 50 mg twice daily will be continued.  Coronary artery disease: Megan Shelton does not have any symptoms to suggest worsening coronary insufficiency.  Continue current medications for secondary prevention.  LDL at goal.  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  Functional capacity is limited at this time.  We have agreed to continue current doses of metoprolol tartrate and lisinopril as well as as needed furosemide.  I spoke with Megan Shelton and her son about indwelling ICD and role for tachytherapy in the setting of her metastatic lung cancer.  At this time, they wish to keep her ICD active.  However, if her prognosis worsens and she transitions to a comfort care approach, her family should reach out to [place] so that we can facilitate device reprogramming with Dr. Caryl Comes.  Stage IV non-small cell lung cancer: Continue ongoing therapy per Dr. Janese Banks.  Follow-up: Return to clinic in 6 months.  Nelva Bush, MD 07/15/2019 2:23 PM

## 2019-07-15 NOTE — Telephone Encounter (Signed)
I spoke with Megan Shelton's son regarding our recommendation to continue with apixaban 5 mg twice daily.  If Ms. coil has any clinical change or there is imaging evidence of bleeding, apixaban will need to be discontinued.  Nelva Bush, MD [facility]

## 2019-07-15 NOTE — Patient Instructions (Signed)
Medication Instructions:  Your physician recommends that you continue on your current medications as directed. Please refer to the Current Medication list given to you today.  *If you need a refill on your cardiac medications before your next appointment, please call your pharmacy*  Follow-Up: At [facility], you and your health needs are our priority.  As part of our continuing mission to provide you with exceptional heart care, we have created designated Provider Care Teams.  These Care Teams include your primary Cardiologist (physician) and Advanced Practice Providers (APPs -  Physician Assistants and Nurse Practitioners) who all work together to provide you with the care you need, when you need it.  We recommend signing up for the patient portal called "MyChart".  Sign up information is provided on this After Visit Summary.  MyChart is used to connect with patients for Virtual Visits (Telemedicine).  Patients are able to view lab/test results, encounter notes, upcoming appointments, etc.  Non-urgent messages can be sent to your provider as well.   To learn more about what you can do with MyChart, go to [email or web address].    Your next appointment:   6 month(s)  The format for your next appointment:   In Person  Provider:    You may see DR [name] END or one of the following Advanced Practice Providers on your designated Care Team:    [name] Berge, NP  Ryan Dunn, PA-C  Jacquelyn Visser, PA-C   

## 2019-07-16 ENCOUNTER — Inpatient Hospital Stay: Payer: Medicare Other

## 2019-07-16 ENCOUNTER — Other Ambulatory Visit: Payer: Self-pay

## 2019-07-16 VITALS — BP 142/85 | HR 75 | Temp 97.5°F | Resp 20

## 2019-07-16 DIAGNOSIS — D696 Thrombocytopenia, unspecified: Secondary | ICD-10-CM | POA: Diagnosis not present

## 2019-07-16 DIAGNOSIS — C3490 Malignant neoplasm of unspecified part of unspecified bronchus or lung: Secondary | ICD-10-CM

## 2019-07-16 DIAGNOSIS — I255 Ischemic cardiomyopathy: Secondary | ICD-10-CM | POA: Diagnosis not present

## 2019-07-16 DIAGNOSIS — I2510 Atherosclerotic heart disease of native coronary artery without angina pectoris: Secondary | ICD-10-CM | POA: Diagnosis not present

## 2019-07-16 DIAGNOSIS — T451X5S Adverse effect of antineoplastic and immunosuppressive drugs, sequela: Secondary | ICD-10-CM | POA: Diagnosis not present

## 2019-07-16 DIAGNOSIS — E871 Hypo-osmolality and hyponatremia: Secondary | ICD-10-CM

## 2019-07-16 DIAGNOSIS — Z95828 Presence of other vascular implants and grafts: Secondary | ICD-10-CM

## 2019-07-16 DIAGNOSIS — D61810 Antineoplastic chemotherapy induced pancytopenia: Secondary | ICD-10-CM | POA: Diagnosis not present

## 2019-07-16 DIAGNOSIS — G9341 Metabolic encephalopathy: Secondary | ICD-10-CM | POA: Diagnosis not present

## 2019-07-16 DIAGNOSIS — C3492 Malignant neoplasm of unspecified part of left bronchus or lung: Secondary | ICD-10-CM | POA: Diagnosis not present

## 2019-07-16 DIAGNOSIS — I110 Hypertensive heart disease with heart failure: Secondary | ICD-10-CM | POA: Diagnosis not present

## 2019-07-16 DIAGNOSIS — Z79899 Other long term (current) drug therapy: Secondary | ICD-10-CM | POA: Diagnosis not present

## 2019-07-16 DIAGNOSIS — D6959 Other secondary thrombocytopenia: Secondary | ICD-10-CM | POA: Diagnosis not present

## 2019-07-16 DIAGNOSIS — I252 Old myocardial infarction: Secondary | ICD-10-CM | POA: Diagnosis not present

## 2019-07-16 DIAGNOSIS — I4891 Unspecified atrial fibrillation: Secondary | ICD-10-CM | POA: Diagnosis not present

## 2019-07-16 DIAGNOSIS — C7951 Secondary malignant neoplasm of bone: Secondary | ICD-10-CM | POA: Diagnosis not present

## 2019-07-16 DIAGNOSIS — E1165 Type 2 diabetes mellitus with hyperglycemia: Secondary | ICD-10-CM | POA: Diagnosis not present

## 2019-07-16 DIAGNOSIS — I5022 Chronic systolic (congestive) heart failure: Secondary | ICD-10-CM | POA: Diagnosis not present

## 2019-07-16 DIAGNOSIS — C7931 Secondary malignant neoplasm of brain: Secondary | ICD-10-CM | POA: Diagnosis not present

## 2019-07-16 LAB — BASIC METABOLIC PANEL
Anion gap: 10 (ref 5–15)
BUN: 11 mg/dL (ref 8–23)
CO2: 26 mmol/L (ref 22–32)
Calcium: 8 mg/dL — ABNORMAL LOW (ref 8.9–10.3)
Chloride: 98 mmol/L (ref 98–111)
Creatinine, Ser: <0.3 mg/dL — ABNORMAL LOW (ref 0.44–1.00)
Glucose, Bld: 128 mg/dL — ABNORMAL HIGH (ref 70–99)
Potassium: 3.8 mmol/L (ref 3.5–5.1)
Sodium: 134 mmol/L — ABNORMAL LOW (ref 135–145)

## 2019-07-16 LAB — CBC WITH DIFFERENTIAL/PLATELET
Abs Immature Granulocytes: 0.38 10*3/uL — ABNORMAL HIGH (ref 0.00–0.07)
Basophils Absolute: 0 10*3/uL (ref 0.0–0.1)
Basophils Relative: 0 %
Eosinophils Absolute: 0.1 10*3/uL (ref 0.0–0.5)
Eosinophils Relative: 1 %
HCT: 40.1 % (ref 36.0–46.0)
Hemoglobin: 13.7 g/dL (ref 12.0–15.0)
Immature Granulocytes: 5 %
Lymphocytes Relative: 4 %
Lymphs Abs: 0.3 10*3/uL — ABNORMAL LOW (ref 0.7–4.0)
MCH: 28.4 pg (ref 26.0–34.0)
MCHC: 34.2 g/dL (ref 30.0–36.0)
MCV: 83.2 fL (ref 80.0–100.0)
Monocytes Absolute: 0.4 10*3/uL (ref 0.1–1.0)
Monocytes Relative: 5 %
Neutro Abs: 7.3 10*3/uL (ref 1.7–7.7)
Neutrophils Relative %: 85 %
Platelets: 183 10*3/uL (ref 150–400)
RBC: 4.82 MIL/uL (ref 3.87–5.11)
RDW: 18.8 % — ABNORMAL HIGH (ref 11.5–15.5)
WBC: 8.5 10*3/uL (ref 4.0–10.5)
nRBC: 0.9 % — ABNORMAL HIGH (ref 0.0–0.2)

## 2019-07-16 MED ORDER — SODIUM CHLORIDE 0.9 % IV SOLN
INTRAVENOUS | Status: AC
  Filled 2019-07-16 (×2): qty 250

## 2019-07-16 MED ORDER — HEPARIN SOD (PORK) LOCK FLUSH 100 UNIT/ML IV SOLN
INTRAVENOUS | Status: AC
  Filled 2019-07-16: qty 5

## 2019-07-16 MED ORDER — HEPARIN SOD (PORK) LOCK FLUSH 100 UNIT/ML IV SOLN
500.0000 [IU] | Freq: Once | INTRAVENOUS | Status: AC
  Administered 2019-07-16: 500 [IU] via INTRAVENOUS
  Filled 2019-07-16: qty 5

## 2019-07-16 MED ORDER — SODIUM CHLORIDE 0.9% FLUSH
10.0000 mL | Freq: Once | INTRAVENOUS | Status: AC
  Administered 2019-07-16: 10 mL via INTRAVENOUS
  Filled 2019-07-16: qty 10

## 2019-07-16 NOTE — Progress Notes (Signed)
Pharmacist Chemotherapy Monitoring - Follow Up Assessment    I verify that I have reviewed each item in the below checklist:  . Regimen for the patient is scheduled for the appropriate day and plan matches scheduled date. Marland Kitchen Appropriate non-routine labs are ordered dependent on drug ordered. . If applicable, additional medications reviewed and ordered per protocol based on lifetime cumulative doses and/or treatment regimen.   Plan for follow-up and/or issues identified: Yes . I-vent associated with next due treatment: Yes  Adelina Mings 07/16/2019 8:57 AM

## 2019-07-17 DIAGNOSIS — I4891 Unspecified atrial fibrillation: Secondary | ICD-10-CM | POA: Diagnosis not present

## 2019-07-17 DIAGNOSIS — I252 Old myocardial infarction: Secondary | ICD-10-CM | POA: Diagnosis not present

## 2019-07-17 DIAGNOSIS — I255 Ischemic cardiomyopathy: Secondary | ICD-10-CM | POA: Diagnosis not present

## 2019-07-17 DIAGNOSIS — T451X5S Adverse effect of antineoplastic and immunosuppressive drugs, sequela: Secondary | ICD-10-CM | POA: Diagnosis not present

## 2019-07-17 DIAGNOSIS — I110 Hypertensive heart disease with heart failure: Secondary | ICD-10-CM | POA: Diagnosis not present

## 2019-07-17 DIAGNOSIS — G9341 Metabolic encephalopathy: Secondary | ICD-10-CM | POA: Diagnosis not present

## 2019-07-17 DIAGNOSIS — I2510 Atherosclerotic heart disease of native coronary artery without angina pectoris: Secondary | ICD-10-CM | POA: Diagnosis not present

## 2019-07-17 DIAGNOSIS — C7931 Secondary malignant neoplasm of brain: Secondary | ICD-10-CM | POA: Diagnosis not present

## 2019-07-17 DIAGNOSIS — D61810 Antineoplastic chemotherapy induced pancytopenia: Secondary | ICD-10-CM | POA: Diagnosis not present

## 2019-07-17 DIAGNOSIS — C7951 Secondary malignant neoplasm of bone: Secondary | ICD-10-CM | POA: Diagnosis not present

## 2019-07-17 DIAGNOSIS — D6959 Other secondary thrombocytopenia: Secondary | ICD-10-CM | POA: Diagnosis not present

## 2019-07-17 DIAGNOSIS — I5022 Chronic systolic (congestive) heart failure: Secondary | ICD-10-CM | POA: Diagnosis not present

## 2019-07-17 DIAGNOSIS — C3492 Malignant neoplasm of unspecified part of left bronchus or lung: Secondary | ICD-10-CM | POA: Diagnosis not present

## 2019-07-19 ENCOUNTER — Encounter: Payer: Self-pay | Admitting: Physician Assistant

## 2019-07-19 ENCOUNTER — Encounter: Payer: Self-pay | Admitting: Oncology

## 2019-07-19 DIAGNOSIS — L89151 Pressure ulcer of sacral region, stage 1: Secondary | ICD-10-CM

## 2019-07-20 ENCOUNTER — Inpatient Hospital Stay (HOSPITAL_BASED_OUTPATIENT_CLINIC_OR_DEPARTMENT_OTHER): Payer: Medicare Other | Admitting: Oncology

## 2019-07-20 ENCOUNTER — Encounter: Payer: Self-pay | Admitting: *Deleted

## 2019-07-20 ENCOUNTER — Encounter: Payer: Self-pay | Admitting: Oncology

## 2019-07-20 DIAGNOSIS — Z7189 Other specified counseling: Secondary | ICD-10-CM | POA: Diagnosis not present

## 2019-07-20 DIAGNOSIS — C3490 Malignant neoplasm of unspecified part of unspecified bronchus or lung: Secondary | ICD-10-CM

## 2019-07-20 DIAGNOSIS — C7931 Secondary malignant neoplasm of brain: Secondary | ICD-10-CM | POA: Diagnosis not present

## 2019-07-20 MED ORDER — MUPIROCIN 2 % EX OINT: 1.0000 "application " | TOPICAL_OINTMENT | Freq: Two times a day (BID) | CUTANEOUS | 0 refills | Status: AC

## 2019-07-20 NOTE — Progress Notes (Signed)
Spoke with patient son Gerald Stabs, they have concerns about patient and if the treatment is doing what it needs to   Please text [phone]

## 2019-07-21 ENCOUNTER — Encounter: Payer: Self-pay | Admitting: Oncology

## 2019-07-23 ENCOUNTER — Inpatient Hospital Stay: Payer: Medicare Other

## 2019-07-23 ENCOUNTER — Inpatient Hospital Stay: Payer: Medicare Other | Admitting: Oncology

## 2019-07-23 NOTE — Progress Notes (Signed)
I connected with Megan Shelton on 07/23/19 at  3:00 PM EDT by video enabled telemedicine visit and verified that I am speaking with the correct person using two identifiers.   I discussed the limitations, risks, security and privacy concerns of performing an evaluation and management service by telemedicine and the availability of in-person appointments. I also discussed with the patient that there may be a patient responsible charge related to this service. The patient expressed understanding and agreed to proceed.  Other persons participating in the visit and their role in the encounter:  Patients son Megan Shelton. Patients daughter also on the phone  Patient's location:  home Provider's location:  work  Risk analyst Complaint: Discuss goals of care  History of present illness: patient is a 79 year old female with a past medical history significant for congestive heart failure with AICD placement, history of thyroid cancer s/p thyroidectomy. She was also a chronic smoker but quit smoking in 2006. More recently patient presented with pain in her left arm which led to an x-ray. X-ray showed comminuted possibly pathologic midshaft humeral fracture. This was followed by a whole-body bone scan which showed multiple sites of abnormal tracer uptake involving left scapula, bilateral posterior ribs, thoracic and lumbar spine, pelvis and multiple sites in the proximal and distal left femur as well as left humeral diaphysis.  PET CT scan shows 4 cm left retrohilar mass in the upper lobe but invading across the major fissure into the left lower lobe with an SUV of 9.1. Scattered osseous metastasis with humeral pathological fracture.  Mri brain showed brain mets  She completed palliative WBRT and palliative RT to left arm and right hip  Acetabular biopsy showed adenocarcinoma. NGSpanel which came back after 1 cycle of chemotherapy showedEGFR C2573T >G (B716R). PDL1 2%  Chemotherapy was held and plan  was to switch patient to [place].  She presented the hospital with decline in her mental status confusion and urinary retention.  Repeat MRI showed mild increase in the size of the left frontal lobe mass with surrounding edema.  Mental status has not improved despite prolonged course of steroids.  She was started on bevacizumab to see if edema around the left frontal lesion can improve and improve her mental status  Interval history: Mental status has been waxing and waning but overall there has been no significant improvement.  She sometimes need assistance with ambulation.  She continues to have paucity of thoughts and does not verbalize much.  She needs assistance for her ADLs including bathing and toileting needs.   Review of Systems  Unable to perform ROS: Mental status change    Allergies  Allergen Reactions  . Latex Rash  . Nickel Rash    Past Medical History:  Diagnosis Date  . AICD (automatic cardioverter/defibrillator) present   . Anxiety   . CHF (congestive heart failure) ([place])   . Diabetes mellitus with neuropathy ([place])   . Dyspnea   . Hypertension   . Hypertensive heart disease   . Implantable cardioverter-defibrillator (ICD)-[employer]   . Lung cancer ([place])    mets to brain  . Myocardial infarction, anterior wall ([place])   . Obesity, Class II, BMI 35-39.9     Past Surgical History:  Procedure Laterality Date  . CARDIOVERSION N/A 05/12/2019   Procedure: CARDIOVERSION;  Surgeon: Nelva Bush, MD;  Location: [facility] ORS;  Service: Cardiovascular;  Laterality: N/A;  . CARPAL TUNNEL RELEASE    . cataract surgery    . EYE SURGERY    . FRACTURE  SURGERY Left    ARM  . JOINT REPLACEMENT Left    tkr  . PORTA CATH INSERTION N/A 05/14/2019   Procedure: PORTA CATH INSERTION;  Surgeon: Algernon Huxley, MD;  Location: [place] [facility];  Service: Cardiovascular;  Laterality: N/A;  . VIDEO BRONCHOSCOPY WITH ENDOBRONCHIAL ULTRASOUND N/A 05/11/2019   Procedure: VIDEO BRONCHOSCOPY  WITH ENDOBRONCHIAL ULTRASOUND;  Surgeon: Tyler Pita, MD;  Location: [facility] ORS;  Service: Pulmonary;  Laterality: N/A;    Social History   Socioeconomic History  . Marital status: Widowed    Spouse name: Not on file  . Number of children: 3  . Years of education: Not on file  . Highest education level: High school graduate  Occupational History  . Occupation: retired  Tobacco Use  . Smoking status: Former Smoker    Packs/day: 1.00    Years: 55.00    Pack years: 55.00    Types: Cigarettes    Quit date: 2006    Years since quitting: 15.2  . Smokeless tobacco: Never Used  . Tobacco comment: quit about 18 years ago; as of 2019  Substance and Sexual Activity  . Alcohol use: No  . Drug use: No  . Sexual activity: Not on file  Other Topics Concern  . Not on file  Social History Narrative  . Not on file   Social Determinants of Health   Financial Resource Strain:   . Difficulty of Paying Living Expenses:   Food Insecurity:   . Worried About Charity fundraiser in the Last Year:   . Arboriculturist in the Last Year:   Transportation Needs:   . Film/video editor (Medical):   Marland Kitchen Lack of Transportation (Non-Medical):   Physical Activity: Inactive  . Days of Exercise per Week: 0 days  . Minutes of Exercise per Session: 0 min  Stress: No Stress Concern Present  . Feeling of Stress : Not at all  Social Connections: Unknown  . Frequency of Communication with Friends and Family: Patient refused  . Frequency of Social Gatherings with Friends and Family: Patient refused  . Attends Religious Services: Patient refused  . Active Member of Clubs or Organizations: Patient refused  . Attends Archivist Meetings: Patient refused  . Marital Status: Patient refused  Intimate Partner Violence: Unknown  . Fear of Current or Ex-Partner: Patient refused  . Emotionally Abused: Patient refused  . Physically Abused: Patient refused  . Sexually Abused: Patient refused     Family History  Problem Relation Age of Onset  . Scleroderma Mother   . Raynaud syndrome Mother   . Cancer Father 77       colon cancer  . Heart disease Brother        died of heart attack  . Diabetes Brother      Current Outpatient Medications:  .  albuterol (VENTOLIN HFA) 108 (90 Base) MCG/ACT inhaler, Inhale 1 puff into the lungs every 6 (six) hours as needed for wheezing or shortness of breath., Disp: 18 g, Rfl: 8 .  apixaban (ELIQUIS) 5 MG TABS tablet, Take 1 tablet (5 mg total) by mouth 2 (two) times daily., Disp: 180 tablet, Rfl: 1 .  atorvastatin (LIPITOR) 40 MG tablet, TAKE 1 TABLET BY MOUTH  DAILY (Patient taking differently: Take 40 mg by mouth daily. ), Disp: 90 tablet, Rfl: 3 .  Calcium Carbonate (CALCIUM 600 PO), Take 2 Doses by mouth daily., Disp: , Rfl:  .  clonazePAM (KLONOPIN) 1  MG tablet, TAKE 1 TABLET BY MOUTH THREE TIMES A DAY AS NEEDED FOR ANXIETY, Disp: 90 tablet, Rfl: 5 .  dexamethasone (DECADRON) 4 MG tablet, Take 2 mg by mouth 2 (two) times daily. Will decrease to 1/2 tablet daily as of 07/16/2019, Disp: , Rfl:  .  furosemide (LASIX) 40 MG tablet, Take 1 tablet (40 mg total) by mouth daily as needed for fluid., Disp: 90 tablet, Rfl: 1 .  glimepiride (AMARYL) 2 MG tablet, Take 1 tablet (2 mg total) by mouth 2 (two) times daily. (Patient taking differently: Take 2 mg by mouth 2 (two) times daily. ), Disp: 180 tablet, Rfl: 1 .  Insulin Pen Needle (BD PEN NEEDLE NANO 2ND GEN) 32G X 4 MM MISC, To use with humalog injection ACHS, Disp: 100 each, Rfl: 0 .  levothyroxine (SYNTHROID) 100 MCG tablet, Take 1 tablet (100 mcg total) by mouth daily at 6 (six) AM., Disp: 90 tablet, Rfl: 1 .  lisinopril (ZESTRIL) 10 MG tablet, TAKE 1 TABLET BY MOUTH  DAILY (Patient taking differently: Take 10 mg by mouth daily. ), Disp: 90 tablet, Rfl: 3 .  loperamide (IMODIUM) 2 MG capsule, Take 2 mg by mouth as needed for diarrhea or loose stools., Disp: , Rfl:  .  magic mouthwash w/lidocaine  SOLN, Take 5 mLs by mouth 4 (four) times daily., Disp: 240 mL, Rfl: 0 .  metFORMIN (GLUCOPHAGE-XR) 500 MG 24 hr tablet, Take 1 tablet (500 mg total) by mouth 2 (two) times daily., Disp: 180 tablet, Rfl: 1 .  metoprolol tartrate (LOPRESSOR) 50 MG tablet, Take 1 tablet (50 mg total) by mouth 2 (two) times daily., Disp: 180 tablet, Rfl: 1 .  mupirocin ointment (BACTROBAN) 2 %, Apply 1 application topically 2 (two) times daily., Disp: 22 g, Rfl: 0 .  pantoprazole (PROTONIX) 40 MG tablet, Take 1 tablet (40 mg total) by mouth daily., Disp: 30 tablet, Rfl: 0 .  sitaGLIPtin (JANUVIA) 100 MG tablet, Take 100 mg by mouth daily., Disp: , Rfl:  .  osimertinib mesylate (TAGRISSO) 40 MG tablet, Take 1 tablet (40 mg total) by mouth daily. (Patient not taking: Reported on 07/15/2019), Disp: 30 tablet, Rfl: 1 .  oxyCODONE (OXY IR/ROXICODONE) 5 MG immediate release tablet, Take 1 tablet (5 mg total) by mouth every 4 (four) hours as needed for severe pain. (Patient not taking: Reported on 07/20/2019), Disp: 60 tablet, Rfl: 0 .  potassium chloride SA (KLOR-CON) 20 MEQ tablet, Take 1 tablet (20 mEq total) by mouth once for 1 dose. (Patient not taking: Reported on 07/20/2019), Disp: 5 tablet, Rfl: 0  No results found.  No images are attached to the encounter.   CMP Latest Ref Rng & Units 07/16/2019  Glucose 70 - 99 mg/dL 128(H)  BUN 8 - 23 mg/dL 11  Creatinine 0.44 - 1.00 mg/dL <0.30(L)  Sodium 135 - 145 mmol/L 134(L)  Potassium 3.5 - 5.1 mmol/L 3.8  Chloride 98 - 111 mmol/L 98  CO2 22 - 32 mmol/L 26  Calcium 8.9 - 10.3 mg/dL 8.0(L)  Total Protein 6.5 - 8.1 g/dL -  Total Bilirubin 0.3 - 1.2 mg/dL -  Alkaline Phos 38 - 126 U/L -  AST 15 - 41 U/L -  ALT 0 - 44 U/L -   CBC Latest Ref Rng & Units 07/16/2019  WBC 4.0 - 10.5 K/uL 8.5  Hemoglobin 12.0 - 15.0 g/dL 13.7  Hematocrit 36.0 - 46.0 % 40.1  Platelets 150 - 400 K/uL 183     Assessment  and plan: Patient is a 79 year old female with stage IV EGFR positive  lung cancer with bone and brain metastases.  This is a visit to discuss overall goals of care  Patient has not had any improvement in her mental status despite a trial of steroids and bevacizumab.  Her quality of life is presently poor and she needs assistance with her ADLs.  She does not verbalize much and has paucity of thoughts.  Patient's son and daughter therefore want to consider hospice which I think would be reasonable.  Despite having EGFR positive lung cancer which does have overall better prognosis as compared to those without a targetable mutation patient's main quality of life issue has been her mental status which has worsened since her whole brain radiation treatment.  She was found to have mild increase in the size of her frontal lobe lesion with surrounding edema but that has not responded to trial of steroids and bevacizumab.  It is therefore reasonable to proceed with home hospice at this time.  She will not be receiving any Tagrisso or bevacizumab at this time.  She does have history of paroxysmal A. fib and is at high risk of strokes and therefore it would be reasonable to continue Eliquis.  However if there is a concern for fall it would also be okay to stop the Eliquis.  In terms of steroids it would be okay for patient to stay on 2 mg Decadron a day as it is not raising her blood sugars and may help with control of brain edema to some extent.  We will make referral to home hospice at this time.  If there is an improvement in her performance status down the line and if family changes their mind, then restarting [place] Nip is an option down the line  Follow-up instructions: No follow-up needed  I discussed the assessment and treatment plan with the patient. The patient was provided an opportunity to ask questions and all were answered. The patient agreed with the plan and demonstrated an understanding of the instructions.   The patient was advised to call back or seek an in-person  evaluation if the symptoms worsen or if the condition fails to improve as anticipated.   Visit Diagnosis: 1. Goals of care, counseling/discussion   2. Brain metastases ([place])   3. Stage 4 malignant neoplasm of lung, unspecified laterality ([place])     Dr. Randa Evens, MD, MPH [place] [facility] Tel- [phone] 07/23/2019 12:07 PM

## 2019-08-03 ENCOUNTER — Telehealth: Payer: Self-pay | Admitting: Internal Medicine

## 2019-08-03 NOTE — Telephone Encounter (Signed)
Mitzi from Hospice informed and order from the Hospice physician is needed to turn off tachy therapies. Fax # provided and Mitzi made aware that once order is received a [employer] representative will be contacted to coordinate disabling tachy therapies.

## 2019-08-03 NOTE — Telephone Encounter (Signed)
New Message    Mitzi from Hospice is calling and says the pts family is requesting the Defib to be turned off  They would like to arrange for this to get done    Please advise

## 2019-08-04 NOTE — Telephone Encounter (Signed)
C    I had spoken to her a few months ago about inactivating tachy therapies and we agreed taht she would let us know when she is ready-- if Mitzi has permission from the patient ( or her surrogate) then please arrange to have device tachy therapies inactivated

## 2019-08-05 NOTE — Telephone Encounter (Signed)
Megan Shelton made aware that Dr Caryl Comes gave order to disable tachy therapies if patient and or family agree ICD to be disabled disabled we will arrange for [employer] representative to do so. Will confirm with family that ICD to be disabled.  LMOM for son Gerald Stabs to call office to acknowledge ICD to be disabled.

## 2019-08-05 NOTE — Telephone Encounter (Signed)
Spoke with Megan Shelton(son) and confirmed that family and patient would like ICD therapy disabled. Will contact [employer] to set up time to reprogram device.

## 2019-08-06 NOTE — Telephone Encounter (Signed)
LMOM for [employer] RN that [employer] rep. Would be contacting her to arrange time to disable ICD therapy.

## 2019-08-12 NOTE — Telephone Encounter (Signed)
Tach therapies disabled by [employer] representative.

## 2019-08-18 ENCOUNTER — Telehealth: Payer: Self-pay

## 2019-08-18 NOTE — Telephone Encounter (Signed)
Spoke with patient to remind of missed remote transmission 

## 2019-08-18 NOTE — Telephone Encounter (Signed)
Pt family member states pt is in hospice care. [employer] has deactivated her device, and will no longer be receiving remote follow up care.

## 2019-08-19 ENCOUNTER — Other Ambulatory Visit: Payer: Self-pay | Admitting: Oncology

## 2019-08-21 ENCOUNTER — Telehealth: Payer: Self-pay | Admitting: *Deleted

## 2019-08-21 NOTE — Progress Notes (Signed)
Patient currently in hospice and no further monthly ICM remote transmissions scheduled.

## 2019-08-21 NOTE — Telephone Encounter (Signed)
I spoke with patient's hospice nurse.  Reportedly, patient took the clonazepam at bedtime and it has been found that she had slid off the bed onto the floor.  Discussed dose reducing the Klonopin to 0.5 mg 3 times daily as needed.  Could alternatively consider rotating to lorazepam.  Per RN, patient has been on benzodiazepines for years, so I would advise against abrupt discontinuation.  Asked that RN review safety/fall precautions with patient and family.

## 2019-08-21 NOTE — Telephone Encounter (Signed)
Mitzi with Hospice called asking for orders for medicine for agitation Please advise

## 2019-08-28 ENCOUNTER — Other Ambulatory Visit: Payer: Self-pay | Admitting: Oncology

## 2019-08-28 DIAGNOSIS — I10 Essential (primary) hypertension: Secondary | ICD-10-CM

## 2019-08-28 DIAGNOSIS — E119 Type 2 diabetes mellitus without complications: Secondary | ICD-10-CM

## 2019-09-05 ENCOUNTER — Other Ambulatory Visit: Payer: Self-pay | Admitting: Oncology

## 2019-09-05 DIAGNOSIS — E119 Type 2 diabetes mellitus without complications: Secondary | ICD-10-CM

## 2019-09-10 ENCOUNTER — Telehealth: Payer: Self-pay | Admitting: *Deleted

## 2019-09-10 ENCOUNTER — Other Ambulatory Visit: Payer: Self-pay | Admitting: Oncology

## 2019-09-10 DIAGNOSIS — I10 Essential (primary) hypertension: Secondary | ICD-10-CM

## 2019-09-10 NOTE — Telephone Encounter (Signed)
Called triage at hospice due to lab of ua and cult results was faxed to our office. She did have 25, 00 to 50,000 bacteria which appears to be not a clean urine specimen. Wanted to know if pt was having sx. Crystal says the nurse that is taking care of her is on vacation and pt is in hospice home for respit due to family on vacation. She will speak to staff at the hospice home and if she is having symptoms she will get NP at hospice to give atb.

## 2019-09-26 ENCOUNTER — Other Ambulatory Visit: Payer: Self-pay | Admitting: Oncology

## 2019-09-26 DIAGNOSIS — E119 Type 2 diabetes mellitus without complications: Secondary | ICD-10-CM

## 2019-09-26 DIAGNOSIS — I10 Essential (primary) hypertension: Secondary | ICD-10-CM

## 2019-09-26 DIAGNOSIS — I4891 Unspecified atrial fibrillation: Secondary | ICD-10-CM

## 2019-09-30 ENCOUNTER — Other Ambulatory Visit: Payer: Self-pay | Admitting: Oncology

## 2019-09-30 ENCOUNTER — Telehealth: Payer: Self-pay | Admitting: *Deleted

## 2019-09-30 MED ORDER — VALACYCLOVIR HCL 1 G PO TABS: 1000.0000 mg | ORAL_TABLET | Freq: Two times a day (BID) | ORAL | 0 refills | Status: DC

## 2019-09-30 MED ORDER — PREDNISOLONE ACETATE 1 % OP SUSP: 1.0000 [drp] | Freq: Four times a day (QID) | OPHTHALMIC | 0 refills | Status: AC

## 2019-09-30 MED ORDER — VALACYCLOVIR HCL 1 G PO TABS: 1000.0000 mg | ORAL_TABLET | Freq: Three times a day (TID) | ORAL | 0 refills | Status: AC

## 2019-09-30 NOTE — Telephone Encounter (Signed)
Megan Shelton with hospice called reporting that patient has developed an itchy blistery rash on her face, into her eye  and is asking what to do for it. Please advise

## 2019-09-30 NOTE — Progress Notes (Signed)
Re: Shingles  Received phone call from [place] stating that patient has developed a rash on right side of face that extends up toward the right eye.  Family noticed rash yesterday evening but more profound this morning.  Patient did complain of some pain this morning but is comfortable now.  Rash appears to be shingles.  Given possible involvement with her eye will touch base with Dr. Janese Banks to see if patient needs IV antivirals versus oral antivirals at home given she is currently on hospice.  Spoke with Dr. Rogue Bussing who agreed to treat at home.  We will send prescriptions to pharmacy.  Hospice RN notified.  RX valacyclovir 1 g 3 times daily x10 days Rx prednisolone eyedrop 4 times daily to affected eye  We will touch base with patient tomorrow  Spoke with Mitzie, hospice RN he will pick up medications this afternoon.  Faythe Casa, NP 09/30/2019 2:46 PM

## 2019-09-30 NOTE — Telephone Encounter (Signed)
Whats Megan Shelton telephone #.  Megan Casa, NP 09/30/2019 2:12 PM

## 2019-09-30 NOTE — Telephone Encounter (Signed)
See separate note regarding this matter

## 2019-09-30 NOTE — Telephone Encounter (Addendum)
Megan Shelton is also asking if her Seroquel can be increased to 50 mg at hs and continue 25 mg during day

## 2019-10-01 ENCOUNTER — Encounter: Payer: Self-pay | Admitting: Oncology

## 2019-10-01 NOTE — Progress Notes (Unsigned)
Re: Follow-up  Shingles appears to be scabbing over. Denies any pain at this time. Her appetite is stable.   Offered Gabapentin should she develop pain.   Daughter declined at this time.   Family will call with questions or concerns.   Faythe Casa, NP 10/01/2019 4:10 PM

## 2019-10-01 NOTE — Telephone Encounter (Signed)
She was started on oral Acyclvir and prednisolone eye drops and Sonia Baller said she was going to call and check on her today

## 2019-10-01 NOTE — Telephone Encounter (Signed)
Anything to be done at this time?

## 2019-10-07 ENCOUNTER — Other Ambulatory Visit: Payer: Self-pay | Admitting: Oncology

## 2019-10-07 DIAGNOSIS — E119 Type 2 diabetes mellitus without complications: Secondary | ICD-10-CM

## 2019-10-07 DIAGNOSIS — I4891 Unspecified atrial fibrillation: Secondary | ICD-10-CM

## 2019-10-21 ENCOUNTER — Other Ambulatory Visit: Payer: Self-pay | Admitting: Oncology

## 2019-11-08 DEATH — deceased

## 2020-01-13 ENCOUNTER — Ambulatory Visit: Payer: Medicare Other | Admitting: Internal Medicine

## 2021-09-20 IMAGING — CR DG HUMERUS 2V *L*
2 series · 2 of 2 positions shown · non-contrast
Comparison: None.

CLINICAL DATA: Pain after physical exertion

EXAM:
LEFT HUMERUS - 2+ VIEW

[humerus ap]
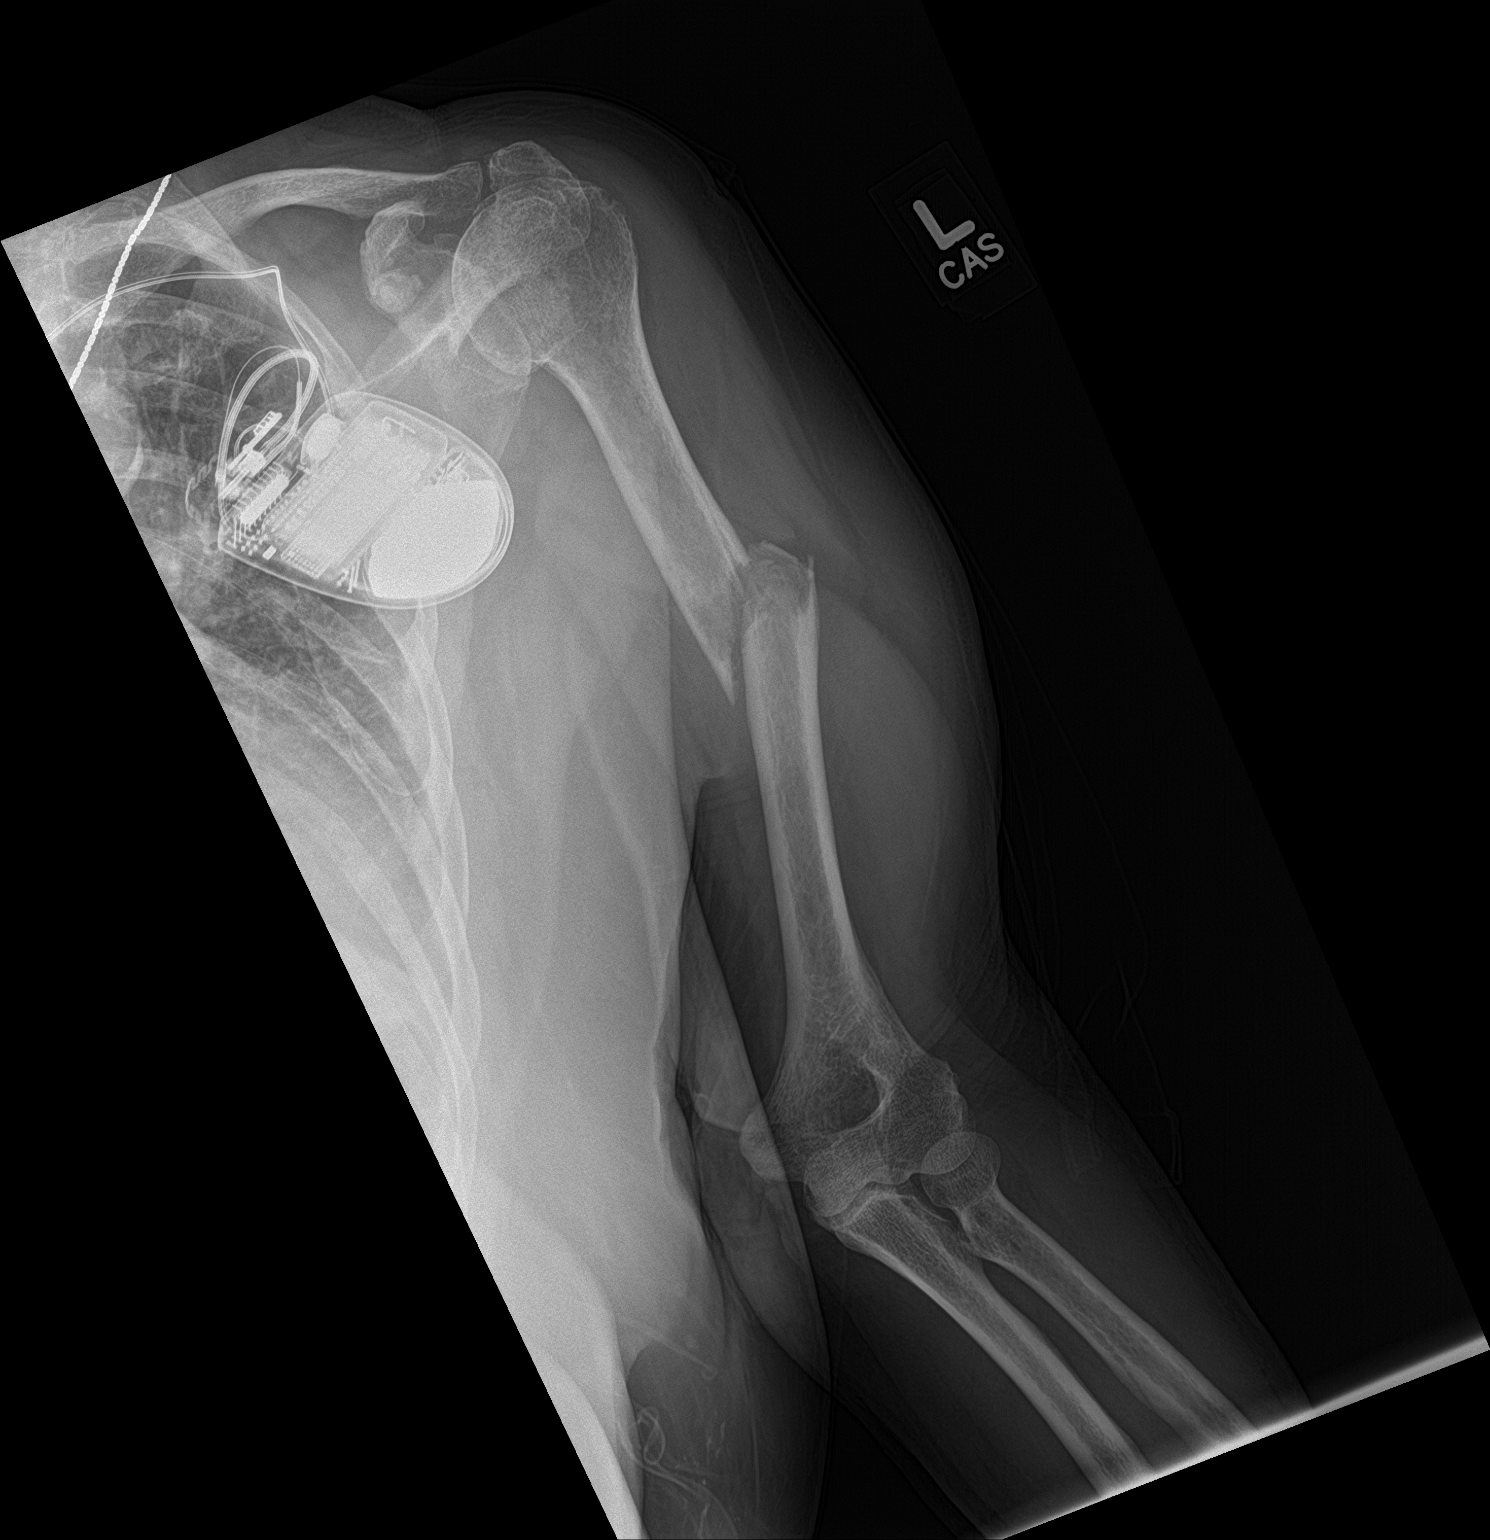

[humerus lat]
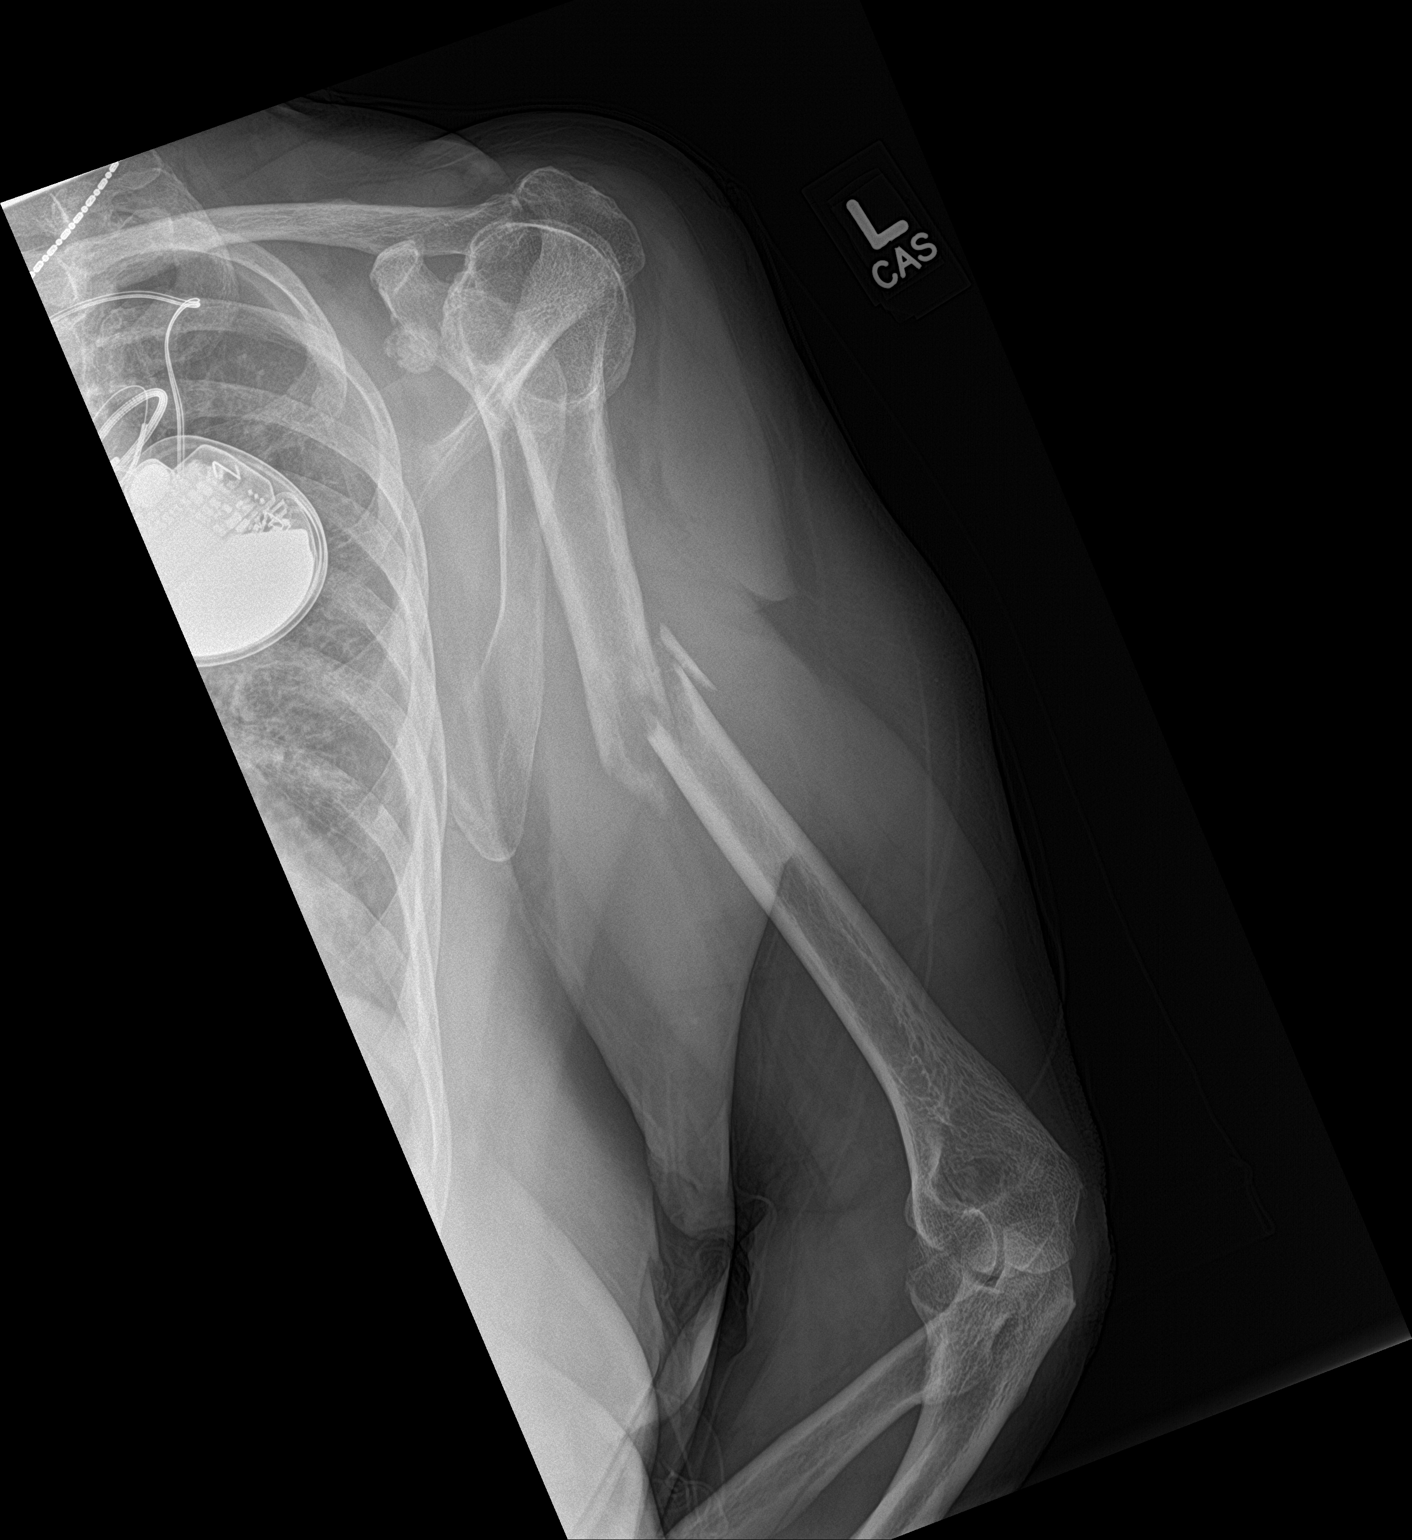

[2 of 2 positions shown; findings below may reference images not displayed]

FINDINGS: Transverse minimally comminuted fracture through the midshaft of the
humerus with nearly shaft with displacement of the major fracture
fragments, mild posteromedial angulation of distal fracture
fragment. The margins of fracture are somewhat indistinct in regions
and cannot entirely exclude underlying lytic lesion. Patient does
appear diffusely osteopenic. No other discrete bone lesions are
identified. Left subclavian pacemaker partially visualized. Aortic
Atherosclerosis ([id number]-170.0).
IMPRESSION: 1. Comminuted, possibly pathologic mid shaft humeral fracture with
mild posteromedial angulation.
2. Osteopenia.

## 2021-11-24 IMAGING — CR DG HUMERUS 2V *R*
1 series · 2 of 2 positions shown · non-contrast
Comparison: Bone scan dated April 14, 2019.

CLINICAL DATA: Osseous metastatic disease.

EXAM:
RIGHT HUMERUS - 2+ VIEW

[Series 1: dg humerus right · 0.14mm/px · 2 of 2 slices shown]
[im 1/2]
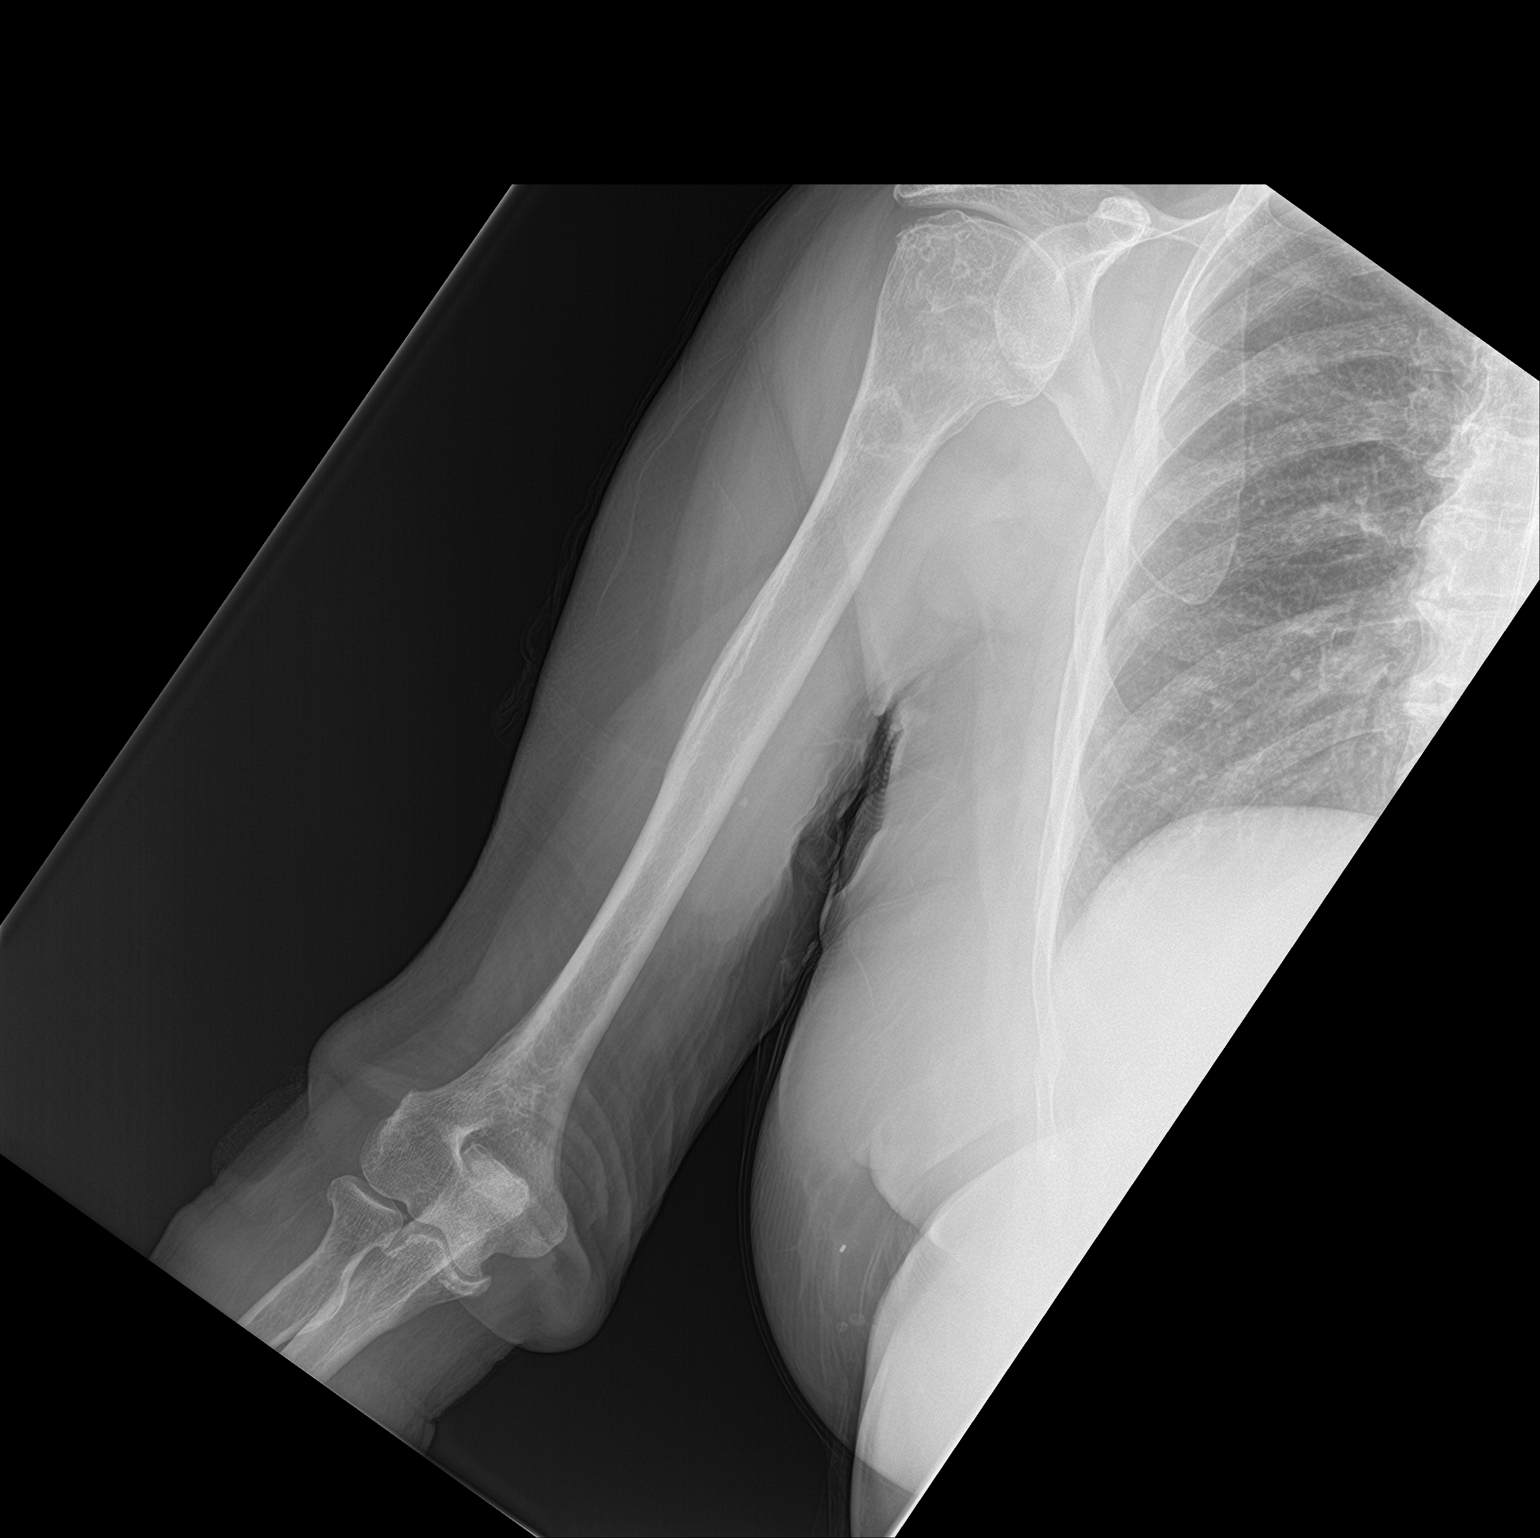
[im 2/2]
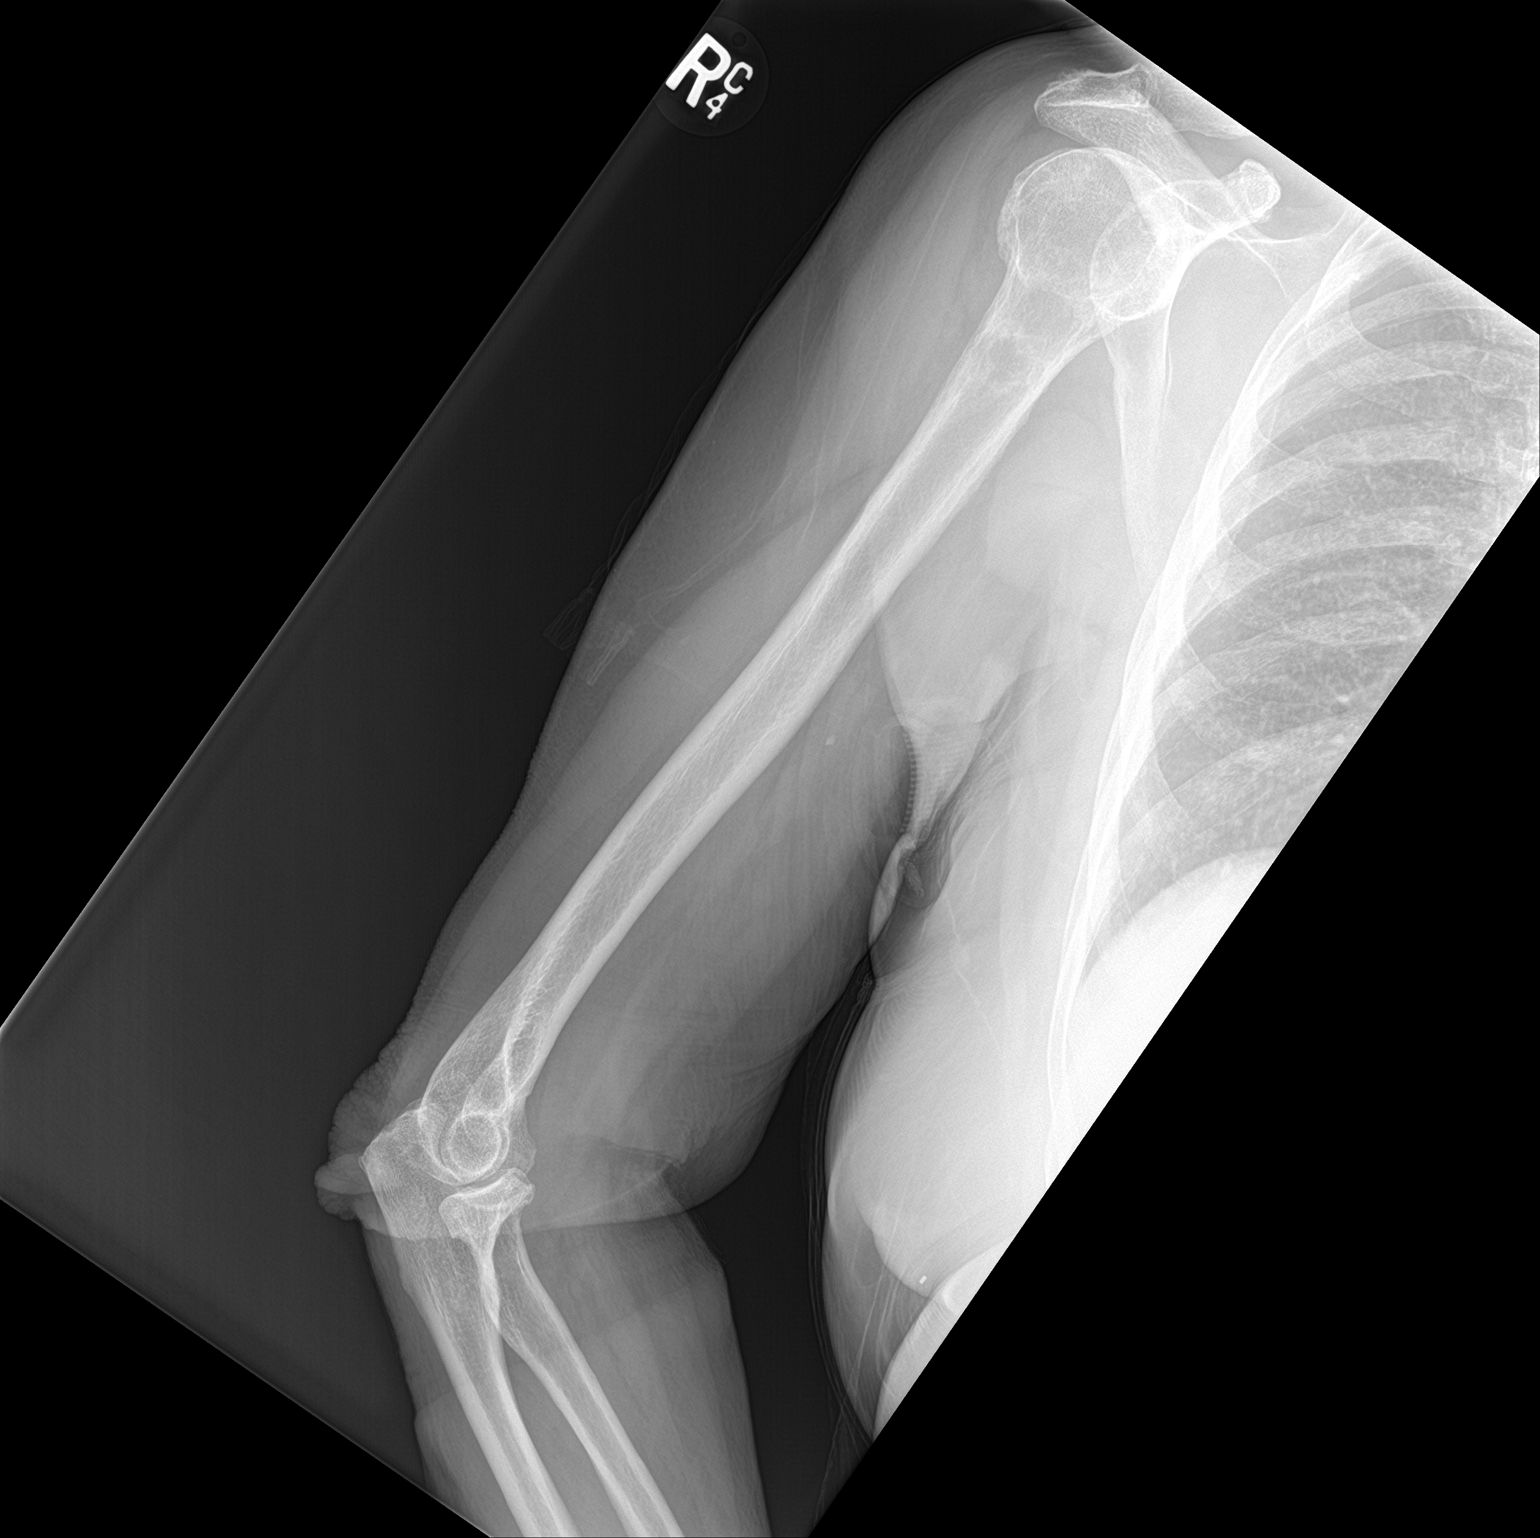

[2 of 2 positions shown; findings below may reference images not displayed]

FINDINGS: 1.7 cm lucent lesion in the proximal humeral diaphysis. No
additional focal bone lesion. No acute fracture or dislocation. Mild
degenerative changes of the shoulder and elbow.
IMPRESSION: 1. 1.7 cm lucent lesion in the proximal humeral diaphysis
corresponding to the area of abnormal uptake on recent bone scan,
consistent with osseous metastatic disease.

## 2022-01-19 IMAGING — CT CT HEAD W/O CM
3 series · 15 of 46 positions shown, 18 images · non-contrast
Comparison: May 08, 2019.

CLINICAL DATA: Altered mental status.

EXAM:
CT HEAD WITHOUT CONTRAST
TECHNIQUE: Contiguous axial images were obtained from the base of the skull
through the vertex without intravenous contrast.

[Series 2: head wo · axial · 0.39mm/px · z∈[+571,+691]mm · 9 of 29 slices shown, 12 images]
[im 3/29  brain]
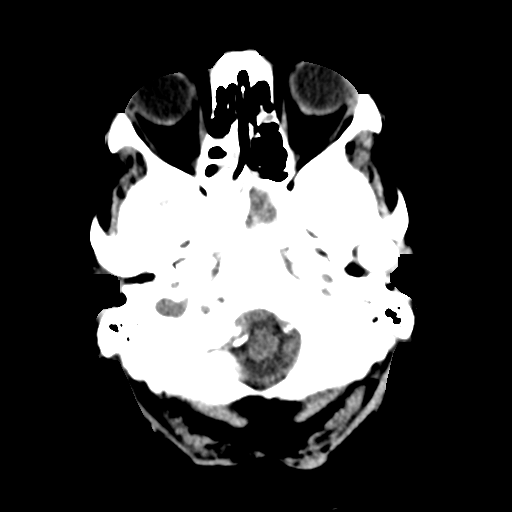
[im 3/29  bone]
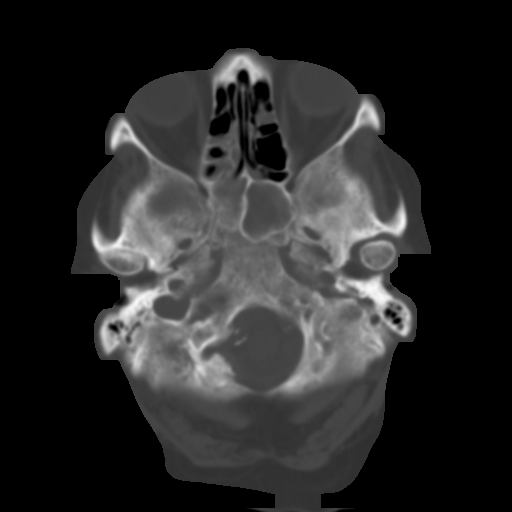
[im 6/29  brain]
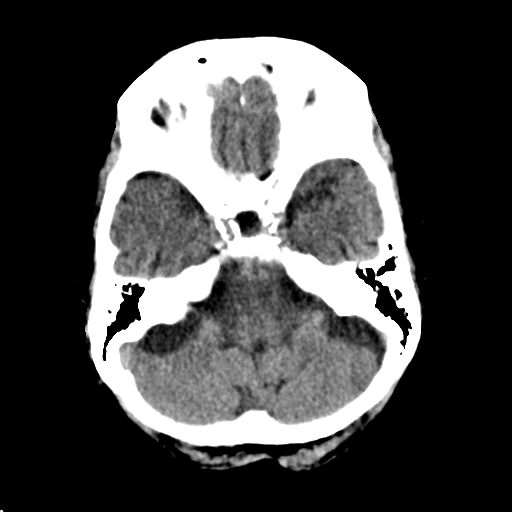
[im 9/29  brain]
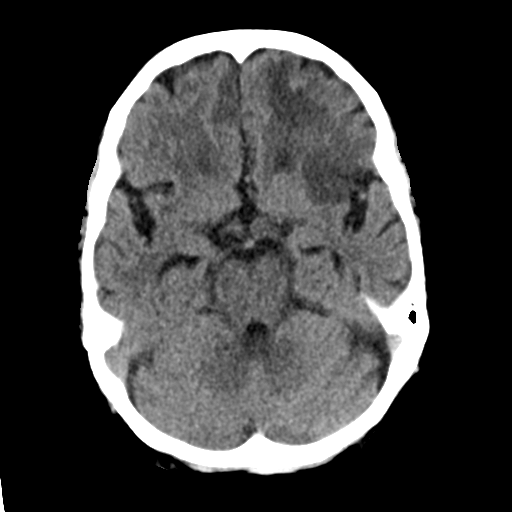
[im 12/29  brain]
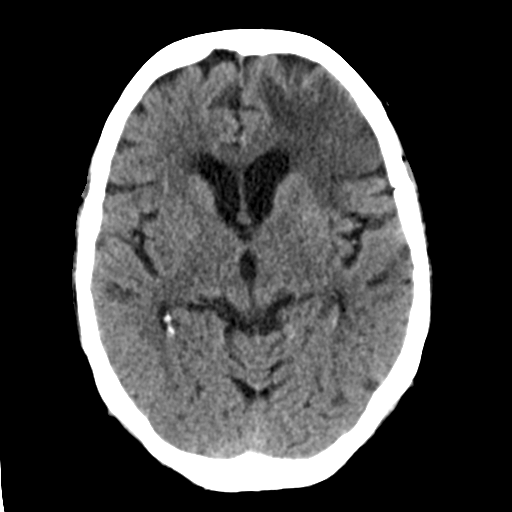
[im 15/29  brain]
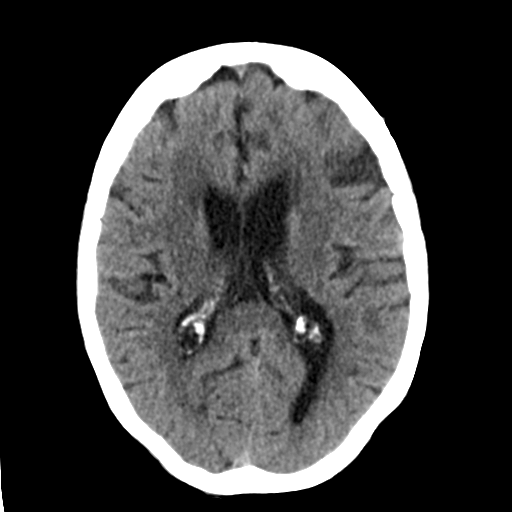
[im 15/29  bone]
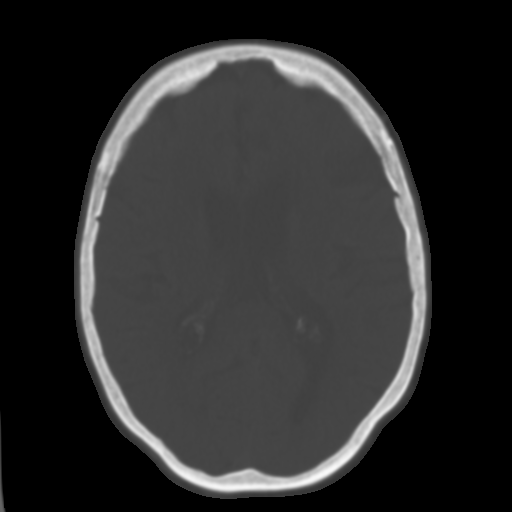
[im 18/29  brain]
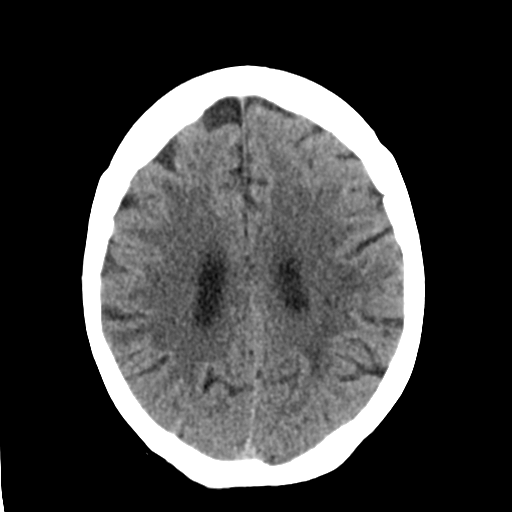
[im 21/29  brain]
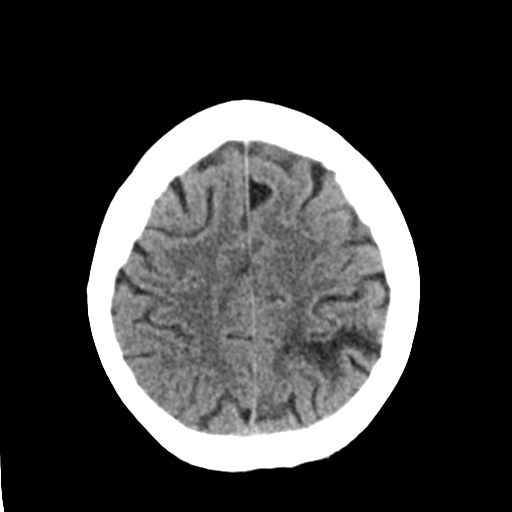
[im 24/29  brain]
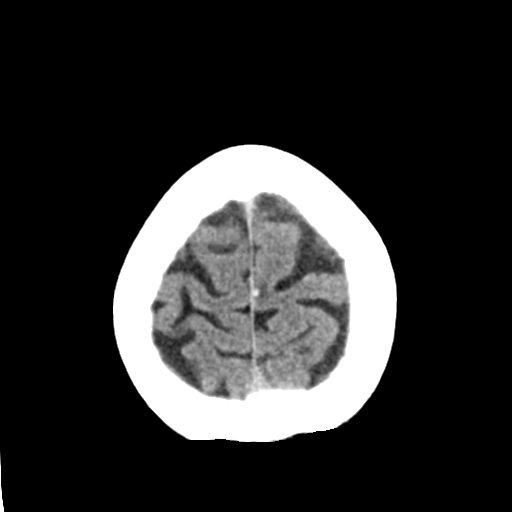
[im 27/29  brain]
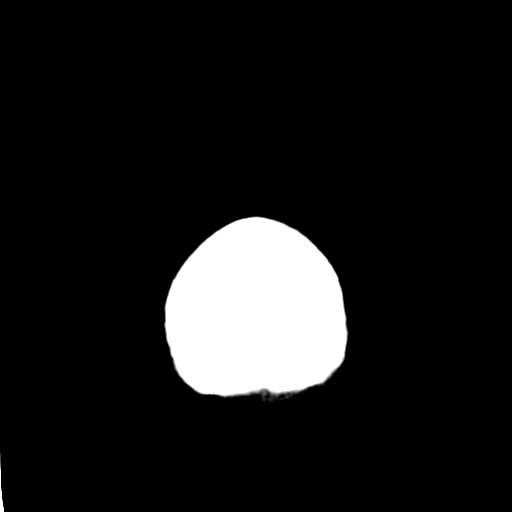
[im 27/29  bone]
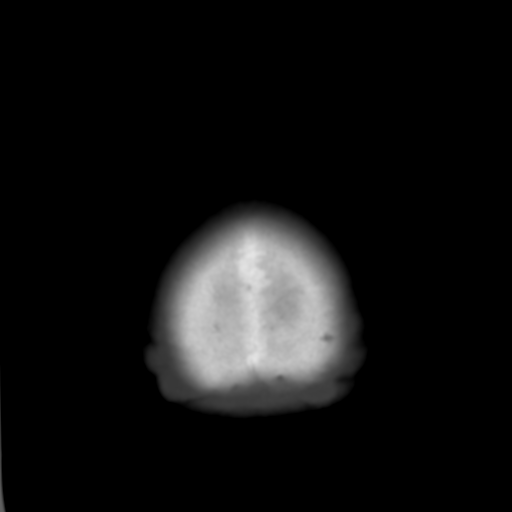

[Series 4: coronal soft tissue · coronal · 0.30mm/px · 3 of 67 slices shown]
[im 23/67  brain]
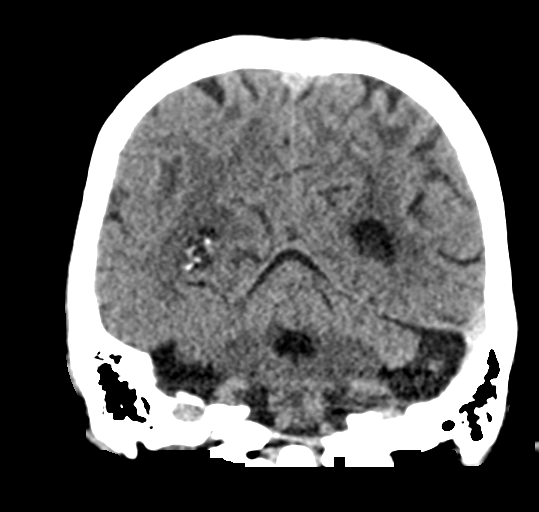
[im 30/67  brain]
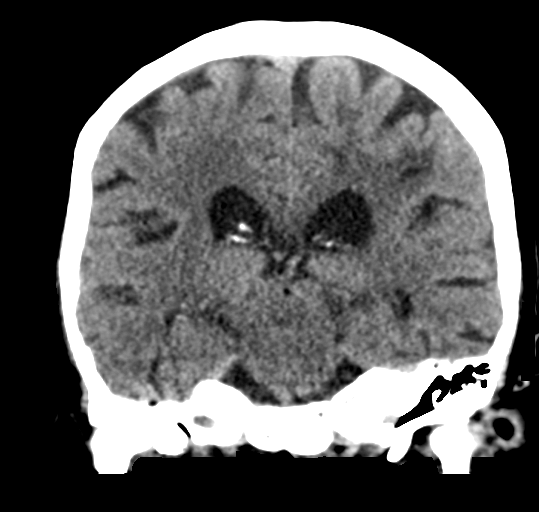
[im 37/67  brain]
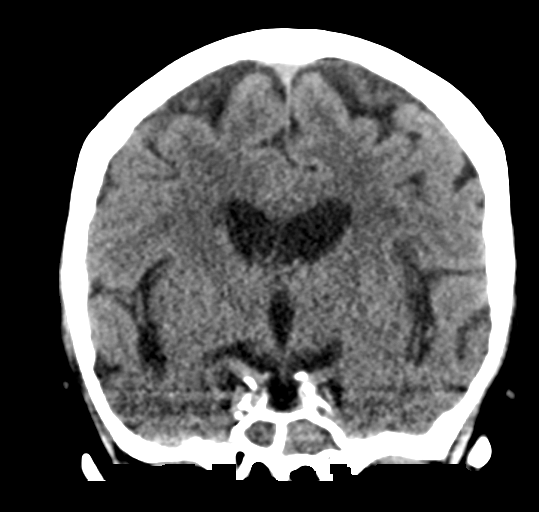

[Series 5: sagittal soft tissue · sagittal · 0.29mm/px · 3 of 54 slices shown]
[im 18/54  brain]
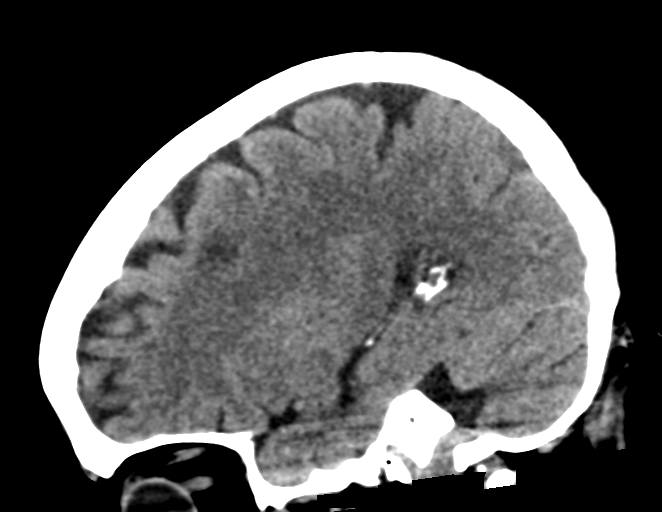
[im 27/54  brain]
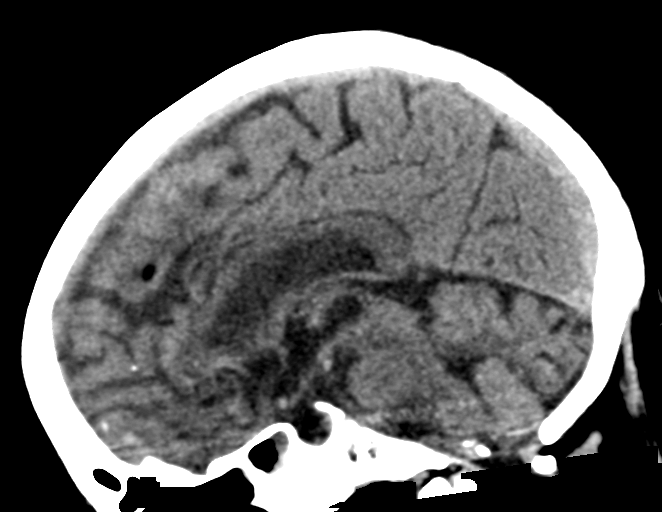
[im 36/54  brain]
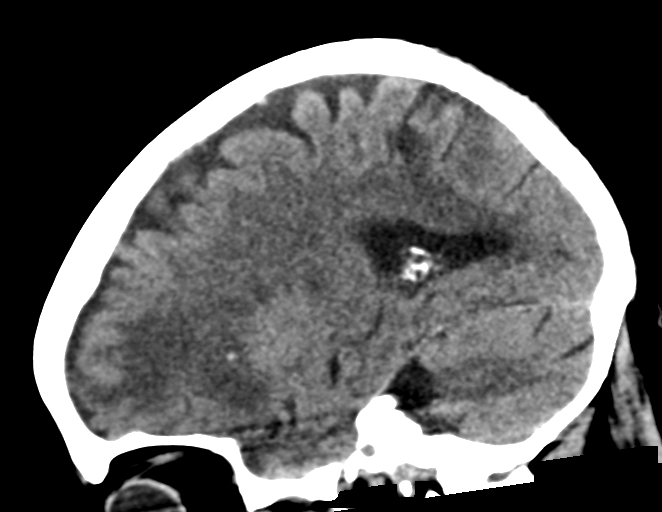

[15 of 46 positions shown; findings below may reference images not displayed]

FINDINGS: Brain: Increased left frontal lobe low density with sulcal
effacement is noted concerning for infarction or edema secondary to
underlying metastatic lesion. Old left parietal infarction is noted.
Ventricular size is within normal limits. No definite evidence of
hemorrhage is noted. No midline shift is noted.

Vascular: No hyperdense vessel or unexpected calcification.

Skull: Normal. Negative for fracture or focal lesion.

Sinuses/Orbits: Bilateral ethmoid and sphenoid sinusitis.

Other: None.
IMPRESSION: Increased left frontal lobe low density with sulcal effacement is
noted concerning for infarction or edema secondary to underlying
metastatic lesion. MRI with and without gadolinium is recommended
for further evaluation.
